# Patient Record
Sex: Female | Born: 1935
Health system: Southern US, Community
[De-identification: ages and names within clinical notes are randomized; demographics above are authoritative.]

## PROBLEM LIST (undated history)

## (undated) DIAGNOSIS — I499 Cardiac arrhythmia, unspecified: Secondary | ICD-10-CM

## (undated) DIAGNOSIS — M810 Age-related osteoporosis without current pathological fracture: Secondary | ICD-10-CM

## (undated) DIAGNOSIS — J310 Chronic rhinitis: Secondary | ICD-10-CM

## (undated) DIAGNOSIS — U071 COVID-19: Secondary | ICD-10-CM

## (undated) DIAGNOSIS — I10 Essential (primary) hypertension: Secondary | ICD-10-CM

## (undated) DIAGNOSIS — E039 Hypothyroidism, unspecified: Secondary | ICD-10-CM

## (undated) DIAGNOSIS — J189 Pneumonia, unspecified organism: Secondary | ICD-10-CM

## (undated) DIAGNOSIS — K219 Gastro-esophageal reflux disease without esophagitis: Secondary | ICD-10-CM

## (undated) DIAGNOSIS — G5790 Unspecified mononeuropathy of unspecified lower limb: Secondary | ICD-10-CM

## (undated) DIAGNOSIS — I4891 Unspecified atrial fibrillation: Secondary | ICD-10-CM

## (undated) HISTORY — DX: Cardiac arrhythmia, unspecified: I49.9

## (undated) HISTORY — PX: BACK SURGERY: SHX140

## (undated) HISTORY — PX: CATARACT EXTRACTION: SUR2

## (undated) HISTORY — PX: NOSE SURGERY: SHX723

---

## 2019-09-04 ENCOUNTER — Emergency Department (HOSPITAL_COMMUNITY): Payer: Medicare HMO

## 2019-09-04 ENCOUNTER — Encounter (HOSPITAL_COMMUNITY): Payer: Self-pay | Admitting: Internal Medicine

## 2019-09-04 ENCOUNTER — Observation Stay (HOSPITAL_COMMUNITY)
Admission: EM | Admit: 2019-09-04 | Discharge: 2019-09-06 | Disposition: A | Discharge status: Home / Self Care | Payer: Medicare HMO | Attending: Internal Medicine | Admitting: Internal Medicine

## 2019-09-04 ENCOUNTER — Other Ambulatory Visit: Payer: Self-pay

## 2019-09-04 DIAGNOSIS — I10 Essential (primary) hypertension: Secondary | ICD-10-CM | POA: Insufficient documentation

## 2019-09-04 DIAGNOSIS — Z20822 Contact with and (suspected) exposure to covid-19: Secondary | ICD-10-CM | POA: Insufficient documentation

## 2019-09-04 DIAGNOSIS — E039 Hypothyroidism, unspecified: Secondary | ICD-10-CM | POA: Diagnosis not present

## 2019-09-04 DIAGNOSIS — M542 Cervicalgia: Secondary | ICD-10-CM | POA: Insufficient documentation

## 2019-09-04 DIAGNOSIS — S3993XA Unspecified injury of pelvis, initial encounter: Secondary | ICD-10-CM | POA: Diagnosis present

## 2019-09-04 DIAGNOSIS — R519 Headache, unspecified: Secondary | ICD-10-CM | POA: Diagnosis not present

## 2019-09-04 DIAGNOSIS — S32592A Other specified fracture of left pubis, initial encounter for closed fracture: Secondary | ICD-10-CM

## 2019-09-04 DIAGNOSIS — Z79899 Other long term (current) drug therapy: Secondary | ICD-10-CM | POA: Diagnosis not present

## 2019-09-04 DIAGNOSIS — W06XXXA Fall from bed, initial encounter: Secondary | ICD-10-CM | POA: Diagnosis not present

## 2019-09-04 DIAGNOSIS — S32599A Other specified fracture of unspecified pubis, initial encounter for closed fracture: Secondary | ICD-10-CM | POA: Diagnosis present

## 2019-09-04 DIAGNOSIS — Y999 Unspecified external cause status: Secondary | ICD-10-CM | POA: Insufficient documentation

## 2019-09-04 DIAGNOSIS — W19XXXA Unspecified fall, initial encounter: Secondary | ICD-10-CM

## 2019-09-04 DIAGNOSIS — S32502A Unspecified fracture of left pubis, initial encounter for closed fracture: Secondary | ICD-10-CM | POA: Diagnosis not present

## 2019-09-04 DIAGNOSIS — M810 Age-related osteoporosis without current pathological fracture: Secondary | ICD-10-CM

## 2019-09-04 DIAGNOSIS — S329XXA Fracture of unspecified parts of lumbosacral spine and pelvis, initial encounter for closed fracture: Secondary | ICD-10-CM | POA: Diagnosis present

## 2019-09-04 DIAGNOSIS — Z87891 Personal history of nicotine dependence: Secondary | ICD-10-CM | POA: Insufficient documentation

## 2019-09-04 DIAGNOSIS — Y939 Activity, unspecified: Secondary | ICD-10-CM | POA: Diagnosis not present

## 2019-09-04 DIAGNOSIS — Y92009 Unspecified place in unspecified non-institutional (private) residence as the place of occurrence of the external cause: Secondary | ICD-10-CM | POA: Diagnosis not present

## 2019-09-04 HISTORY — DX: Gastro-esophageal reflux disease without esophagitis: K21.9

## 2019-09-04 HISTORY — DX: Unspecified mononeuropathy of unspecified lower limb: G57.90

## 2019-09-04 HISTORY — DX: Essential (primary) hypertension: I10

## 2019-09-04 HISTORY — DX: Chronic rhinitis: J31.0

## 2019-09-04 HISTORY — DX: Unspecified atrial fibrillation: I48.91

## 2019-09-04 HISTORY — DX: Hypothyroidism, unspecified: E03.9

## 2019-09-04 HISTORY — DX: Age-related osteoporosis without current pathological fracture: M81.0

## 2019-09-04 LAB — CBC WITH DIFFERENTIAL/PLATELET
Abs Immature Granulocytes: 0.03 10*3/uL (ref 0.00–0.07)
Basophils Absolute: 0 10*3/uL (ref 0.0–0.1)
Basophils Relative: 0 %
Eosinophils Absolute: 0 10*3/uL (ref 0.0–0.5)
Eosinophils Relative: 0 %
HCT: 41.3 % (ref 36.0–46.0)
Hemoglobin: 13.4 g/dL (ref 12.0–15.0)
Immature Granulocytes: 0 %
Lymphocytes Relative: 9 %
Lymphs Abs: 0.9 10*3/uL (ref 0.7–4.0)
MCH: 30.2 pg (ref 26.0–34.0)
MCHC: 32.4 g/dL (ref 30.0–36.0)
MCV: 93.2 fL (ref 80.0–100.0)
Monocytes Absolute: 0.6 10*3/uL (ref 0.1–1.0)
Monocytes Relative: 6 %
Neutro Abs: 9.1 10*3/uL — ABNORMAL HIGH (ref 1.7–7.7)
Neutrophils Relative %: 85 %
Platelets: 250 10*3/uL (ref 150–400)
RBC: 4.43 MIL/uL (ref 3.87–5.11)
RDW: 14 % (ref 11.5–15.5)
WBC: 10.7 10*3/uL — ABNORMAL HIGH (ref 4.0–10.5)
nRBC: 0 % (ref 0.0–0.2)

## 2019-09-04 LAB — URINALYSIS, ROUTINE W REFLEX MICROSCOPIC
Bacteria, UA: NONE SEEN
Bilirubin Urine: NEGATIVE
Glucose, UA: NEGATIVE mg/dL
Hgb urine dipstick: NEGATIVE
Ketones, ur: NEGATIVE mg/dL
Nitrite: NEGATIVE
Protein, ur: NEGATIVE mg/dL
Specific Gravity, Urine: 1.006 (ref 1.005–1.030)
pH: 8 (ref 5.0–8.0)

## 2019-09-04 LAB — BASIC METABOLIC PANEL
Anion gap: 11 (ref 5–15)
BUN: 15 mg/dL (ref 8–23)
CO2: 25 mmol/L (ref 22–32)
Calcium: 9.3 mg/dL (ref 8.9–10.3)
Chloride: 103 mmol/L (ref 98–111)
Creatinine, Ser: 1.11 mg/dL — ABNORMAL HIGH (ref 0.44–1.00)
GFR calc Af Amer: 50 mL/min — ABNORMAL LOW (ref >60)
GFR calc non Af Amer: 43 mL/min — ABNORMAL LOW (ref >60)
Glucose, Bld: 131 mg/dL — ABNORMAL HIGH (ref 70–99)
Potassium: 3.7 mmol/L (ref 3.5–5.1)
Sodium: 139 mmol/L (ref 135–145)

## 2019-09-04 LAB — SARS CORONAVIRUS 2 BY RT PCR (HOSPITAL ORDER, PERFORMED IN ~~LOC~~ HOSPITAL LAB): SARS Coronavirus 2: NEGATIVE

## 2019-09-04 MED ORDER — ACETAMINOPHEN 325 MG PO TABS: 650.0000 mg | ORAL_TABLET | Freq: Four times a day (QID) | ORAL | Status: DC | PRN

## 2019-09-04 MED ORDER — OXYCODONE HCL 5 MG PO TABS
5.0000 mg | ORAL_TABLET | ORAL | Status: DC | PRN
  Administered 2019-09-04 – 2019-09-06 (×2): 5 mg via ORAL
  Filled 2019-09-04 (×2): qty 1

## 2019-09-04 MED ORDER — FENTANYL CITRATE (PF) 100 MCG/2ML IJ SOLN
50.0000 ug | Freq: Once | INTRAMUSCULAR | Status: AC
  Administered 2019-09-04: 50 ug via INTRAVENOUS
  Filled 2019-09-04: qty 2

## 2019-09-04 MED ORDER — LISINOPRIL 5 MG PO TABS
2.5000 mg | ORAL_TABLET | Freq: Every day | ORAL | Status: DC
  Administered 2019-09-04 – 2019-09-05 (×2): 2.5 mg via ORAL
  Filled 2019-09-04 (×3): qty 1

## 2019-09-04 MED ORDER — HYDROMORPHONE HCL 1 MG/ML IJ SOLN
0.5000 mg | Freq: Once | INTRAMUSCULAR | Status: AC
  Administered 2019-09-04: 0.5 mg via INTRAVENOUS
  Filled 2019-09-04: qty 1

## 2019-09-04 MED ORDER — IPRATROPIUM BROMIDE 0.06 % NA SOLN
1.0000 | Freq: Two times a day (BID) | NASAL | Status: DC
  Administered 2019-09-05 (×3): 3 via NASAL
  Administered 2019-09-06: 1 via NASAL
  Filled 2019-09-04: qty 15

## 2019-09-04 MED ORDER — ENOXAPARIN SODIUM 30 MG/0.3ML ~~LOC~~ SOLN
30.0000 mg | SUBCUTANEOUS | Status: DC
  Administered 2019-09-04 – 2019-09-05 (×2): 30 mg via SUBCUTANEOUS
  Filled 2019-09-04 (×3): qty 0.3

## 2019-09-04 MED ORDER — LEVOTHYROXINE SODIUM 50 MCG PO TABS
50.0000 ug | ORAL_TABLET | Freq: Every day | ORAL | Status: DC
  Administered 2019-09-05 – 2019-09-06 (×2): 50 ug via ORAL
  Filled 2019-09-04 (×2): qty 1

## 2019-09-04 MED ORDER — ACETAMINOPHEN 650 MG RE SUPP: 650.0000 mg | Freq: Four times a day (QID) | RECTAL | Status: DC | PRN

## 2019-09-04 MED ORDER — ADULT MULTIVITAMIN W/MINERALS CH
1.0000 | ORAL_TABLET | Freq: Every day | ORAL | Status: DC
  Administered 2019-09-05 – 2019-09-06 (×2): 1 via ORAL
  Filled 2019-09-04 (×2): qty 1

## 2019-09-04 MED ORDER — PANTOPRAZOLE SODIUM 40 MG PO TBEC
40.0000 mg | DELAYED_RELEASE_TABLET | Freq: Every day | ORAL | Status: DC
  Administered 2019-09-04 – 2019-09-06 (×3): 40 mg via ORAL
  Filled 2019-09-04 (×3): qty 1

## 2019-09-04 MED ORDER — GABAPENTIN 300 MG PO CAPS
300.0000 mg | ORAL_CAPSULE | Freq: Three times a day (TID) | ORAL | Status: DC
  Administered 2019-09-04 – 2019-09-06 (×7): 300 mg via ORAL
  Filled 2019-09-04 (×6): qty 1

## 2019-09-04 NOTE — ED Notes (Signed)
Report attempted x 1

## 2019-09-04 NOTE — H&P (Signed)
Date: 09/04/2019               Patient Name:  Kathleen Santana MRN: [id number]  DOB: May 04, 1926 Age / Sex: 86 y.o., female   PCP: Deloris Ping, MD         Medical Service: Internal Medicine Teaching Service         Attending Physician: Dr. Heide Spark MD    First Contact: Dr. Renaldo Fiddler Pager: [phone]  Second Contact: Dr. Nedra Hai Pager: [phone]       After Hours (After 5p/  First Contact Pager: [phone]  weekends / holidays): Second Contact Pager: [phone]   Chief Complaint: Fall/Hip Pain  History of Present Illness:   Kathleen Santana is a 86 y/o F with a PMHx of HTN, hypothyroidism, bilateral lower extremity neuropathy, GERD who was brought in by EMS after falling out of bed early this morning and having right sided head and right sided hip pain. The patient states she was sleeping in bed and rolled out of the bed. She reported hitting the right side of her head and right hip. She denies any loss of consciousness. She denies any dizziness, shortness of breath, chest pain, or pleuritic chest pain prior, during, or after the fall. After falling, she scooted herself to the bathroom but was unable to stand up. She then scooted herself back to the bedside, pulled down the cord on a land line phone, and called her daughter. Her daughter arrived at the patient's home and called 911. EMS arrived on scene and brought patient to the ED.   She reports right hip pain, she describes the pain as "terrible." She states the pain is worse with standing and movement and notes relief when lying in bed and with rest. She also notes right elbow pain and right rib pain. She states she started feeling the right rib pain while in the ED. She states she has a dull headache that comes and goes. She reports the pain goes around her head.    She reports episodes of feeling flushed while in the ED. Denies any recent illness. She notes just getting back from the beach. She has not taken any of her daily  medications today. She states she feels a bit dehydrated. She denies any rib pain when taking a deep breath.   The patient lives alone and is able to ambulate on her own without any assistance. She is able to perform her ADL's independently and she walks 3 miles daily.   She has no other complaints at the time of my examination.  Meds:  Lisinopril 2.5 mg tab, qd Gabapentin 300 mg capsule tid Ipratropium .03% nasal spray Two sprays nostril daily BID-TID Levothyroxine sodium 50 mcg tab QD Omeprazole 20 mg capsule daily  Allergies: Allergies as of 09/04/2019 - Review Complete 09/04/2019  Allergen Reaction Noted  . Other Other (See Comments) and Cough 02/09/2013   Past Medical History:  Diagnosis Date  . Afib (HCC)   . GERD (gastroesophageal reflux disease)   . Hypertension   . Hypothyroidism   . Lower extremity neuropathy    PSH: Hysterectomy, Tonsillectomy, Adenoidectomy  Family History:  Breast Cancer Daughter Arthritis Sister Stroke Sister  Social History:  Smoking - Former Smoker, .50 PPD. Quit 1971.  Alcohol - None Caffeine - Half a up of coffee daily  Review of Systems: A complete ROS was negative except as per HPI.   Physical Exam: Blood pressure (!) 173/84, pulse 95, temperature 98.2 F (36.8  C), temperature source Oral, resp. rate 19, height 5\' 2"  (1.575 m), weight 54 kg, SpO2 98 %. Physical Exam Vitals and nursing note reviewed.  Constitutional:      General: She is not in acute distress.    Appearance: Normal appearance. She is not ill-appearing, toxic-appearing or diaphoretic.  HENT:     Head: Normocephalic and atraumatic.  Eyes:     Extraocular Movements: Extraocular movements intact.  Cardiovascular:     Rate and Rhythm: Normal rate and regular rhythm.     Pulses: Normal pulses.     Heart sounds: Normal heart sounds. No murmur heard.  No friction rub. No gallop.      Comments: Strong bilateral posterior tibial and dorsalis pedis pulses.    Pulmonary:     Effort: Pulmonary effort is normal. No respiratory distress.     Breath sounds: Normal breath sounds. No stridor. No wheezing, rhonchi or rales.  Abdominal:     General: Abdomen is flat. Bowel sounds are normal. There is no distension.     Palpations: Abdomen is soft.     Tenderness: There is abdominal tenderness (LUQ). There is no guarding or rebound.  Musculoskeletal:        General: Swelling (right lateral upper thigh) and tenderness (right posterior hip, lateral hip. Pain right hip with movement) present. No deformity.     Cervical back: Normal range of motion.     Right lower leg: No edema.     Left lower leg: No edema.     Comments: Right leg shorter than left, patient states chronic  Skin:    General: Skin is warm and dry.     Findings: Bruising (right lateral thigh) present. No erythema.  Neurological:     General: No focal deficit present.     Mental Status: She is alert and oriented to person, place, and time. Mental status is at baseline.     Sensory: No sensory deficit.     Comments: Normal sensation of lower extremities.   Psychiatric:        Mood and Affect: Mood normal.        Behavior: Behavior normal.    EKG: personally reviewed my interpretation is Normal Sinus Rhythm, Rate of 85. Normal PR and QT intervals. Normal Axis. No ST segment abnormalities. Mild Artifact Present.  Imaging:  X-Ray Ribs unilateral w/ chest right IMPRESSION: No acute disease. Aortic Atherosclerosis   CT Hip Right W/O Contrast IMPRESSION: Acute fractures of the right sacrum and right superior and inferior pubic rami. Negative for hip fracture or dislocation. Soft tissue contusion adjacent to the right hip. Mild right hip osteoarthritis.  X-Ray Lumbar Spine Complete  IMPRESSION: No acute abnormality. Lumbar degenerative change.  X-Ray Right Hip Unilateral W or W/O Pelvis 2-3 Views IMPRESSION: Acute left superior pubic ramus fracture just peripheral to the pubic  body. No other acute abnormality is identified.  CT Head W/O & Cervical Spine W/O Contrast IMPRESSION: 1. No acute intracranial abnormality. 2. Signs of atrophy and chronic microvascular ischemic change. 3. Chronic sinusitis with complete opacification in the RIGHT maxillary sinus 4. No evidence for acute fracture or malalignment of the cervical spine. 5. Multilevel spinal degenerative changes. 6. Biapical pleural and parenchymal scarring greatest on the RIGHT. Aortic Atherosclerosis   Assessment & Plan by Problem:  Kathleen HolterHelen Santana is a 86 y/o F with a PMHx of HTN, AFIB, Hypothyroidism, Lower Extremity Neuropathy , Osteoperosis, GERD, and Non-Allergic Rhinitis who presents to the ED from EMS with a  fall from bed height resulting in right head, rib, and hip pain. Imaging revealed acute fractures of the right sacrum and right superior/inferior pubic rami. No hip fracture or dislocation present. CT Head W/O C negative for acute intracranial abnormality. Right side chest imaging revealed no acute diseases present. Patient to be admitted to internal medicine teaching service for observation for further observation.  Right Sacrum and Right Superior/Inferior Pubic Rami Fracture - Suspected etiology of fractures being that of osteoporosis in context of fall from low height.  - Low suspicion for syncopal episode, CVA/TIA, or cardiac etiology being cause of patient's fall. - Fractures evident on patient's right hip x-ray as well as right hip CT - Right Hip X-ray was ordered, however, radiologist findings and interpretation stated left sided pathological findings. Attempted to contact radiologist for clarification. Pending response at this time.  - No evidence of vascular or neurological damage present on physical exam - Current recommendations per uptodate concerning sacral and pubic rami fractures are pain control and early ambulation/movement. Do not believe orthopedic evaluation is warranted at  this time. - Will have PT/OT evaluate patient and give their recommendations - Pain well controlled with tylenol 650 mg Q6H PRN for mild pain, Oxycodone 5 mg Q4H PRN for moderate pain - Patient received dilaudid injection .5mg  while in ED.  History of Osteoporosis - DEXA from 07/05/2019 indicated osteopetrosis. Suspected etiology of patient's fracture from low height.  - Patient notes PCP wanted to treat with Prolia, but patient states it was too expensive - Will consider starting alendronate while admitted or have patient follow up with PCP  Headache - Patient having dull headache around her head. Suspected etiology of contusion, also considering concussion. - CT Head W/O Contrast did not reveal any acute intracranial abnormality. Low suspicion for cranial hemorrhage at this time.  - Will continue to monitor for acute neurological changes - Pain regimen as per above  Right Sided Rib Pain - Patient complaining of right sided rib pain.  - Imaging revealed no acute disease.  - Suspected etiology contusion vs costochondritis vs muscle strain. Low suspicion for fractured rib, pneumothorax, MI.  - Pain regimen as per above  Transient Hypoxia - EMS noted that patient became hypoxic when ambulating around the room, O2 saturation became 77% on room air. Tech was unsure if this was a true reading. - ED physician assistant 07/07/2019 stated that he himself ambulated the patient around the room and her O2 saturation did not drop below 98% on room air. - Patient not hypoxic upon my evaluation, denies any shortness of breath, chest pain, palpitations. Low suspicion for pulmonary embolism, respiratory infection, pulmonary etiology. - Suspect episode of hypoxia was not patient's true O2 saturation - Will continue to evaluate and monitor during her hospitalization.   Hypertension - History of hypertension, recent BP of 143/89.  - Will continue home regimen of lisinopril 2.5 mg tab daily at bedtime    History of AFIB - Patient notes PMHx of Atrial Fibrillation. History unclear, but patient notes that she was treated on medication for one month and has not been in afib since as far as she knows. - Denies any palpitations, fluttering in chest, feeling of heart racing, or chest pain.  - EKG revealed no afib or other arrythmias. Normal sinus rhythm. - Will continue to monitor.   History of Hypothyroidism - Patient with history of known hypothyroidism, well controlled on levothyroxine - Will treat with home medication of levothyroxine 50 mcg tab daily with breakfast  History of Bilateral Lower Extremity Neuropathy - Patient with history of bilateral lower extremity neuropathy, followed by neurology - Patient unsure of origin of neuropathy - Will continue home medication of gabapentin 300 mg capsule TID before meals and bedtime.  GERD - Patient with PMHx of GERD - Patient on omeprazole at home, will order pantoprazole 40 mg tab daily  Non-Allergic Rhinitis - Patient with PMHx of non-allergic rhinitis, leading to patient having chronic wet cough and gurgling in throat - Will continue home medication of ipratropium .03% nasal spray 1-3 sprays per nostril BID  Diet: Heart Healthy DVT Prophylaxis: Lovenox 30 mg Code Status: Full Code Dispo: Admit patient to Observation with expected length of stay less than 2 midnights.  SignedBelva Agee, MD 09/04/2019, 10:04 PM  Pager: [phone] After 5pm on weekdays and 1pm on weekends: On Call pager: [phone]

## 2019-09-04 NOTE — ED Provider Notes (Signed)
Patient was received at handoff due to shift change from [facility], [place], provided HPI, current work-up, likely disposition.  Please see her note for full detail.  In short patient presents to the emergency department after suffering a mechanical fall at home.  She fell on her right hip and has had hip pain.  Imaging was performed showed she has acute fracture of the right sacrum and right superior and inferior pubic rami, no evidence of hip fracture or dislocations.  Lab work-up  shows slight hyperglycemia of 131, slight elevation in creatinine of 1.11, slight leukocytosis of 10.7.  Covid negative, UA shows trace leukocytes, no nitrates, bacteria or white blood cells noted. Physical Exam  BP (!) 179/78   Pulse 89   Temp (!) 97.5 F (36.4 C) (Oral)   Resp 18   SpO2 92%   Physical Exam Vitals and nursing note reviewed.  Constitutional:      General: She is not in acute distress.    Appearance: She is not ill-appearing.  HENT:     Head: Normocephalic and atraumatic.     Nose: No congestion.     Mouth/Throat:     Mouth: Mucous membranes are moist.     Pharynx: Oropharynx is clear.  Eyes:     General: No scleral icterus. Cardiovascular:     Rate and Rhythm: Normal rate and regular rhythm.     Pulses: Normal pulses.     Heart sounds: No murmur heard.  No friction rub. No gallop.   Pulmonary:     Effort: No respiratory distress.     Breath sounds: No wheezing, rhonchi or rales.  Abdominal:     General: There is no distension.     Tenderness: There is no abdominal tenderness. There is no guarding.  Musculoskeletal:        General: Tenderness and signs of injury present. No swelling or deformity.     Right lower leg: No edema.     Left lower leg: No edema.     Comments: decreased range of motion in her right hip flexor unable to adductor or abductor, as well as marked weakness with hip flexion and extension.  Neurovascular fully intact.  Skin:    General: Skin is warm and dry.      Capillary Refill: Capillary refill takes less than 2 seconds.     Findings: No rash.  Neurological:     General: No focal deficit present.     Mental Status: She is alert.  Psychiatric:        Mood and Affect: Mood normal.     ED Course/Procedures   Clinical Course as of Sep 04 1915  Sat Sep 04, 2019  1256 There is no fracture or other acute abnormality. 0.8 cm anterolisthesis L3 on L4 due to facet arthropathy is noted. Convex left scoliosis with the apex at T12 is seen. Loss of disc space height is worst at T12-L1, L1-2 and L3-4.   DG Lumbar Spine Complete [CG]  1257 Acute left superior pubic ramus fracture just peripheral to the pubic body. No other acute abnormality is identified.  DG Hip Unilat W or Wo Pelvis 2-3 Views Right [CG]  1436 Was told by EMT that patient SPO2 dropped to 77% after ambulation around the bed.  Tech  unsure if there was a good Financial controller wave.  Per EMT patient appeared unsteady due to pain and had increased work of breathing after walking trial.  I personally ambulated patient a second time and  SPO2 remained greater than 98%.  Some gurgling noise in her throat and wet sounding cough but patient states this is chronic due to "hyperactive sinus glands" that she has had surgery for.  She ambulated around her bed once with some assistance and at the end noted to be breathing faster, admits to breathing "feeling labored".  Eventually this resolved. Patient and family at bedside state pt is very active for her age and walks 3 miles every other day without issues.     [CG]    Clinical Course User Index [CG] Liberty Handy, PA-C    Procedures  MDM    I have personally reviewed all imaging, labs and have interpreted them.  Patient presents after a mechanical fall with right hip pain.  Patient was alert and oriented did not appear to be in acute distress, vital signs within normal limits.  On exam patient had decreased range of motion in her right hip flexor  unable to adductor or abductor, as well as marked weakness with hip flexion and extension.  Neurovascular fully intact.  Patient was ambulated became slightly hypoxic and was severely unstable on her feet.  Patient is nonhypoxic while supine, low suspicion for PE or systemic infection as x-ray does not show any acute abnormalities.  Likely shortness of breath and imbalance secondary to pain of right pelvis fracture.  Patient is at a high fall risk and would benefit from inpatient observation as well as PT assessment.  Will contact hospitalist for further recommendations and evaluation.  Patient's pain is under control is not requiring additional pain management at this time.  19:59 spoke with Dr. Maxcine Ham internal medical service and she states she will come and evaluate the patient for admission to the hospital.  Patient care will be transferred over to hospitalist team for further you evaluation.  Patient daughter was at bedside and was notified of patient disposition.       Carroll Sage, PA-C 09/04/19 2048    Rolan Bucco, MD 09/04/19 2101

## 2019-09-04 NOTE — ED Triage Notes (Signed)
Patient arrives via EMS from home due falling out of bed this morning around 0630. Per the patient, she was asleep and was on the edge of the bed when she rolled off. She did hit the left sider of her head, she is NOT on blood thinners. She lives alone.   Patient arrives with c/o of right hip/groin pain that worsens with movement. Rates pain a 4/10. Alert and oriented x4.

## 2019-09-04 NOTE — ED Notes (Signed)
Helped ambulate pt around the bed while holding onto the pt. She stated that her pain was worse and it hurt to pick up her leg. Her Spo2 dropped to 77.

## 2019-09-04 NOTE — ED Provider Notes (Signed)
[facility] EMERGENCY DEPARTMENT Provider Note   CSN: [id number] Arrival date & time: 09/04/19  1019     History Chief Complaint  Patient presents with  . Fall    Kathleen Santana is a 86 y.o. female with no significant past medical history presents to the ED for evaluation of fall.  History obtained from patient.  Daughter is at bedside.  States she was just sleeping and thinks she rolled off her bed and fell onto her right side on the ground.  She hit the right side of her head.  She has a local right-sided headache, right-sided neck pain, and abrasion in her right elbow but the majority of her pain is localized in her right hip and low back.  Has not been able to walk due to the pain in the hip.  Denies any other injuries.  Denies vision changes, nausea or vomiting.  Denies chest pain or shortness of breath.  No abdominal pain.  Reports history of neuropathy in her feet but no sudden loss of sensation in her toes or legs or arms.  Feels like she has to urinate but cannot do this on the pure wick.  Has been feeling well and at her baseline the last few days without any malaise, fevers or chills.  No dysuria recently.  Denies any recent palpitations, chest pain lightheadedness or syncope.  States she has not fallen in a long time.  She lives alone.  No anticoagulants.  HPI     Past Medical History:  Diagnosis Date  . Afib (HCC)   . GERD (gastroesophageal reflux disease)   . Hypertension   . Hypothyroidism   . Lower extremity neuropathy   . Osteoporosis   . Rhinitis, non-allergic     Patient Active Problem List   Diagnosis Date Noted  . Pubic ramus fracture (HCC) 09/04/2019  . Osteoporosis 09/04/2019    History reviewed. No pertinent surgical history.   OB History   No obstetric history on file.     History reviewed. No pertinent family history.  Social History   Tobacco Use  . Smoking status: Former Smoker    Types: Cigarettes  . Smokeless  tobacco: Never Used  Substance Use Topics  . Alcohol use: Not on file  . Drug use: Not on file    Home Medications Prior to Admission medications   Medication Sig Start Date End Date Taking? Authorizing Provider  Cholecalciferol (VITAMIN D-3 PO) Take 1 capsule by mouth daily with breakfast.   Yes [provider]  gabapentin (NEURONTIN) 300 MG capsule Take 300 mg by mouth in the morning, at noon, in the evening, and at bedtime.   Yes [provider]  ipratropium (ATROVENT) 0.03 % nasal spray Place 1-3 sprays into both nostrils in the morning and at bedtime.   Yes [provider]  levothyroxine (SYNTHROID) 50 MCG tablet Take 50 mcg by mouth daily before breakfast.   Yes [provider]  lisinopril (ZESTRIL) 2.5 MG tablet Take 2.5 mg by mouth at bedtime.   Yes [provider]  Multiple Vitamins-Minerals (ONE-A-DAY WOMENS 50+ ADVANTAGE) TABS Take 1 tablet by mouth daily with breakfast.   Yes [provider]  omeprazole (PRILOSEC) 20 MG capsule Take 20 mg by mouth daily as needed (before eating spicy/hot foods).    Yes [provider]    Allergies    Other  Review of Systems   Review of Systems  Musculoskeletal: Positive for arthralgias, gait problem and  neck pain.  Skin: Positive for wound.  Neurological: Positive for headaches.  All other systems reviewed and are negative.   Physical Exam Updated Vital Signs BP 136/72 (BP Location: Left Arm)   Pulse 78   Temp (!) 97.3 F (36.3 C) (Oral)   Resp 15   Ht 5\' 2"  (1.575 m)   Wt 53.1 kg   SpO2 96%   BMI 21.41 kg/m   Physical Exam Vitals and nursing note reviewed.  Constitutional:      General: She is not in acute distress.    Appearance: She is well-developed.     Comments: NAD.  HENT:     Head: Normocephalic.     Comments: Patient reports pain posterior right ear but I cannot reproduce tenderness.  No crepitus, abrasions or lacerations in the scalp.  No facial  bone tenderness or signs of injury.    Right Ear: External ear normal.     Left Ear: External ear normal.     Nose: Nose normal.     Mouth/Throat:     Comments: Dentition intact and stable.  No intraoral or tongue injury. Eyes:     General: No scleral icterus.    Conjunctiva/sclera: Conjunctivae normal.  Neck:     Comments: No cervical collar in place.  Right-sided neck tenderness.  No midline C-spine tenderness.  Full range of motion of the neck, patient reports pain with right neck bend and rotation.  Trachea midline. Cardiovascular:     Rate and Rhythm: Normal rate and regular rhythm.     Heart sounds: Normal heart sounds.  Pulmonary:     Effort: Pulmonary effort is normal.     Breath sounds: Normal breath sounds.  Abdominal:     Palpations: Abdomen is soft.     Tenderness: There is no abdominal tenderness.     Comments: Pure work in place, I repositioned this to help patient urinate.  No suprapubic or CVA tenderness.  Musculoskeletal:        General: Tenderness present. No deformity. Normal range of motion.     Cervical back: Normal range of motion and neck supple. Tenderness present.     Comments:  Pelvis: Right anterior hip and inguinal crease tenderness.  Pain with right logroll and internal rotation, hip flexion.  Patient can heel drag the right leg with some pain in the right hip and groin. Patient can rearrange herself in bed, sit up without assistance. Mild right-sided lumbar muscular tenderness.  No midline tenderness in the lumbar, thoracic spine. Pelvis stable without instability in APL compression. No leg shortening or rotation.   Examination of the right upper extremity, right knee, right ankle do not reveal any signs of acute trauma, tenderness.  Skin:    General: Skin is warm and dry.     Capillary Refill: Capillary refill takes less than 2 seconds.  Neurological:     Mental Status: She is alert and oriented to person, place, and time.     Comments:   Mental  Status: Patient is awake, alert, oriented to person, place, year, and situation. Patient is able to give a clear and coherent history.  Speech is fluent and clear without dysarthria or aphasia. No signs of neglect.  Cranial Nerves: I not tested II visual fields full bilaterally. PERRL.  Unable to visualize posterior eye. III, IV, VI EOMs intact without ptosis or diplopia  V sensation to light touch intact in all 3 divisions of trigeminal nerve bilaterally  VII facial movements symmetric bilaterally  VIII hearing intact to voice/conversation  IX, X no uvula deviation, symmetric rise of soft palate/uvula XI 5/5 SCM and trapezius strength bilaterally  XII tongue protrusion midline, symmetric L/R movements  Motor: Strength 5/5 in upper/lower extremities.  Sensation to light touch intact in face, upper/lower extremities.    Cerebellar: No ataxia with finger to nose.   Psychiatric:        Behavior: Behavior normal.        Thought Content: Thought content normal.        Judgment: Judgment normal.     ED Results / Procedures / Treatments   Labs (all labs ordered are listed, but only abnormal results are displayed) Labs Reviewed  CBC WITH DIFFERENTIAL/PLATELET - Abnormal; Notable for the following components:      Result Value   WBC 10.7 (*)    Neutro Abs 9.1 (*)    All other components within normal limits  BASIC METABOLIC PANEL - Abnormal; Notable for the following components:   Glucose, Bld 131 (*)    Creatinine, Ser 1.11 (*)    GFR calc non Af Amer 43 (*)    GFR calc Af Amer 50 (*)    All other components within normal limits  URINALYSIS, ROUTINE W REFLEX MICROSCOPIC - Abnormal; Notable for the following components:   Color, Urine STRAW (*)    Leukocytes,Ua TRACE (*)    All other components within normal limits  BASIC METABOLIC PANEL - Abnormal; Notable for the following components:   Glucose, Bld 128 (*)    Creatinine, Ser 1.09 (*)    Calcium 8.7 (*)    GFR calc non Af Amer  44 (*)    GFR calc Af Amer 51 (*)    All other components within normal limits  CBC - Abnormal; Notable for the following components:   RBC 3.73 (*)    Hemoglobin 11.6 (*)    HCT 34.4 (*)    All other components within normal limits  SARS CORONAVIRUS 2 BY RT PCR (HOSPITAL ORDER, PERFORMED IN [facility])    EKG None  Radiology DG Ribs Unilateral W/Chest Right  Result Date: 09/04/2019 CLINICAL DATA:  Status post fall getting out of bed this morning. Right chest pain. Initial encounter. EXAM: RIGHT RIBS AND CHEST - 3+ VIEW COMPARISON:  None. FINDINGS: Lungs clear. Heart size normal. Aortic atherosclerosis. No pneumothorax or pleural fluid. No acute bony abnormality. Scoliosis noted. IMPRESSION: No acute disease. Aortic Atherosclerosis (ICD10-I70.0). Electronically Signed   By: Drusilla Kanner M.D.   On: 09/04/2019 15:47   DG Lumbar Spine Complete  Result Date: 09/04/2019 CLINICAL DATA:  Low back pain after a fall out of bed this morning. Initial encounter. EXAM: LUMBAR SPINE - COMPLETE 4+ VIEW COMPARISON:  None. FINDINGS: There is no fracture or other acute abnormality. 0.8 cm anterolisthesis L3 on L4 due to facet arthropathy is noted. Convex left scoliosis with the apex at T12 is seen. Loss of disc space height is worst at T12-L1, L1-2 and L3-4. IMPRESSION: No acute abnormality. Lumbar degenerative change. Electronically Signed   By: Drusilla Kanner M.D.   On: 09/04/2019 12:39   CT Head Wo Contrast  Result Date: 09/04/2019 CLINICAL DATA:  Neck trauma. EXAM: CT HEAD WITHOUT CONTRAST CT CERVICAL SPINE WITHOUT CONTRAST TECHNIQUE: Multidetector CT imaging of the head and cervical spine was performed following the standard protocol without intravenous contrast. Multiplanar CT image reconstructions of the cervical spine were also generated. COMPARISON:  None FINDINGS: CT HEAD FINDINGS Brain:  No evidence of acute infarction, hemorrhage, hydrocephalus, extra-axial collection or mass  lesion/mass effect. Signs of atrophy and chronic microvascular ischemic change. Vascular: No hyperdense vessel or unexpected calcification. Skull: Normal. Negative for fracture or focal lesion. Sinuses/Orbits: Near complete opacification of the RIGHT maxillary sinus indicative of chronic sinus disease. Thickening of the walls of the LEFT maxillary sinus also with some mucosal thickening in the LEFT maxillary sinus indicative of chronic sinus disease. Similar changes though less pronounced in the ethmoid sinuses. Mild involvement of LEFT sphenoid. No air-fluid level. Orbits are unremarkable. Other: None. CT CERVICAL SPINE FINDINGS Alignment: Straightening of normal cervical lordosis likely due to patient position or spasm. Skull base and vertebrae: No acute fracture. No primary bone lesion or focal pathologic process. Soft tissues and spinal canal: No prevertebral fluid or swelling. No visible canal hematoma. Disc levels: Degenerative changes of the atlantoaxial joint with mild pannus formation. Multilevel degenerative change with disc space narrowing and uncovertebral degenerative changes. Degenerative changes greatest at C5-6 and C6-7 with respect to disc space narrowing and uncovertebral degenerative changes with similar changes seen throughout the cervical spine. Facet hypertrophy and facet degenerative changes greatest at C4-5 on the LEFT. Upper chest: Biapical pleural and parenchymal scarring greatest on the RIGHT. Other: None IMPRESSION: 1. No acute intracranial abnormality. 2. Signs of atrophy and chronic microvascular ischemic change. 3. Chronic sinusitis with complete opacification in the RIGHT maxillary sinus 4. No evidence for acute fracture or malalignment of the cervical spine. 5. Multilevel spinal degenerative changes. 6. Biapical pleural and parenchymal scarring greatest on the RIGHT. Aortic Atherosclerosis (ICD10-I70.0). Electronically Signed   By: Donzetta Kohut M.D.   On: 09/04/2019 11:51   CT  Cervical Spine Wo Contrast  Result Date: 09/04/2019 CLINICAL DATA:  Neck trauma. EXAM: CT HEAD WITHOUT CONTRAST CT CERVICAL SPINE WITHOUT CONTRAST TECHNIQUE: Multidetector CT imaging of the head and cervical spine was performed following the standard protocol without intravenous contrast. Multiplanar CT image reconstructions of the cervical spine were also generated. COMPARISON:  None FINDINGS: CT HEAD FINDINGS Brain: No evidence of acute infarction, hemorrhage, hydrocephalus, extra-axial collection or mass lesion/mass effect. Signs of atrophy and chronic microvascular ischemic change. Vascular: No hyperdense vessel or unexpected calcification. Skull: Normal. Negative for fracture or focal lesion. Sinuses/Orbits: Near complete opacification of the RIGHT maxillary sinus indicative of chronic sinus disease. Thickening of the walls of the LEFT maxillary sinus also with some mucosal thickening in the LEFT maxillary sinus indicative of chronic sinus disease. Similar changes though less pronounced in the ethmoid sinuses. Mild involvement of LEFT sphenoid. No air-fluid level. Orbits are unremarkable. Other: None. CT CERVICAL SPINE FINDINGS Alignment: Straightening of normal cervical lordosis likely due to patient position or spasm. Skull base and vertebrae: No acute fracture. No primary bone lesion or focal pathologic process. Soft tissues and spinal canal: No prevertebral fluid or swelling. No visible canal hematoma. Disc levels: Degenerative changes of the atlantoaxial joint with mild pannus formation. Multilevel degenerative change with disc space narrowing and uncovertebral degenerative changes. Degenerative changes greatest at C5-6 and C6-7 with respect to disc space narrowing and uncovertebral degenerative changes with similar changes seen throughout the cervical spine. Facet hypertrophy and facet degenerative changes greatest at C4-5 on the LEFT. Upper chest: Biapical pleural and parenchymal scarring greatest on  the RIGHT. Other: None IMPRESSION: 1. No acute intracranial abnormality. 2. Signs of atrophy and chronic microvascular ischemic change. 3. Chronic sinusitis with complete opacification in the RIGHT maxillary sinus 4. No evidence for acute fracture or  malalignment of the cervical spine. 5. Multilevel spinal degenerative changes. 6. Biapical pleural and parenchymal scarring greatest on the RIGHT. Aortic Atherosclerosis (ICD10-I70.0). Electronically Signed   By: Donzetta Kohut M.D.   On: 09/04/2019 11:51   CT Hip Right Wo Contrast  Result Date: 09/04/2019 CLINICAL DATA:  Right hip pain after a fall out of bed this morning. Initial encounter. EXAM: CT OF THE RIGHT HIP WITHOUT CONTRAST TECHNIQUE: Multidetector CT imaging of the right hip was performed according to the standard protocol. Multiplanar CT image reconstructions were also generated. COMPARISON:  None. FINDINGS: Bones/Joint/Cartilage An acute, nondisplaced fracture of the right sacrum is identified. Minimally displaced fracture of the superior pubic ramus is also seen. Finally, mild buckling of the cortex of the inferior pubic ramus is consistent with a nondisplaced fracture. The right hip is and no hip fracture is seen. There is mild degenerative disease about the hip. No lytic or sclerotic lesion is identified. Ligaments Suboptimally assessed by CT. Muscles and Tendons Intact. Soft tissues Subcutaneous contusion adjacent to the right hip noted. IMPRESSION: Acute fractures of the right sacrum and right superior and inferior pubic rami. Negative for hip fracture or dislocation. Soft tissue contusion adjacent to the right hip. Mild right hip osteoarthritis. Electronically Signed   By: Drusilla Kanner M.D.   On: 09/04/2019 15:14   DG Hip Unilat W or Wo Pelvis 2-3 Views Right  Result Date: 09/04/2019 CLINICAL DATA:  Left hip pain after a fall out of bed this morning. Initial encounter. EXAM: DG HIP (WITH OR WITHOUT PELVIS) 2-3V RIGHT COMPARISON:  None.  FINDINGS: The left hip is located and intact. The patient has a nondisplaced left superior pubic ramus fracture just peripheral to the pubic body. Bones are osteopenic. IMPRESSION: Acute left superior pubic ramus fracture just peripheral to the pubic body. No other acute abnormality is identified. Electronically Signed   By: Drusilla Kanner M.D.   On: 09/04/2019 12:37    Procedures Procedures (including critical care time)  Medications Ordered in ED Medications  enoxaparin (LOVENOX) injection 30 mg (30 mg Subcutaneous Given 09/04/19 2135)  oxyCODONE (Oxy IR/ROXICODONE) immediate release tablet 5 mg (5 mg Oral Given 09/04/19 2325)  acetaminophen (TYLENOL) tablet 650 mg (has no administration in time range)    Or  acetaminophen (TYLENOL) suppository 650 mg (has no administration in time range)  gabapentin (NEURONTIN) capsule 300 mg (300 mg Oral Given 09/04/19 2330)  levothyroxine (SYNTHROID) tablet 50 mcg (50 mcg Oral Given 09/05/19 0620)  lisinopril (ZESTRIL) tablet 2.5 mg (2.5 mg Oral Given 09/04/19 2325)  pantoprazole (PROTONIX) EC tablet 40 mg (40 mg Oral Given 09/04/19 2325)  multivitamin with minerals tablet 1 tablet (has no administration in time range)  ipratropium (ATROVENT) 0.06 % nasal spray 1-3 spray (3 sprays Each Nare Given 09/05/19 0622)  fentaNYL (SUBLIMAZE) injection 50 mcg (50 mcg Intravenous Given 09/04/19 1256)  HYDROmorphone (DILAUDID) injection 0.5 mg (0.5 mg Intravenous Given 09/04/19 1721)    ED Course  I have reviewed the triage vital signs and the nursing notes.  Pertinent labs & imaging results that were available during my care of the patient were reviewed by me and considered in my medical decision making (see chart for details).  Clinical Course as of Sep 05 815  Sat Sep 04, 2019  1256 There is no fracture or other acute abnormality. 0.8 cm anterolisthesis L3 on L4 due to facet arthropathy is noted. Convex left scoliosis with the apex at T12 is seen. Loss of disc  space height is worst at T12-L1, L1-2 and L3-4.   DG Lumbar Spine Complete [CG]  1257 Acute left superior pubic ramus fracture just peripheral to the pubic body. No other acute abnormality is identified.  DG Hip Unilat W or Wo Pelvis 2-3 Views Right [CG]  1436 Was told by EMT that patient SPO2 dropped to 77% after ambulation around the bed.  Tech  unsure if there was a good Financial controller wave.  Per EMT patient appeared unsteady due to pain and had increased work of breathing after walking trial.  I personally ambulated patient a second time and SPO2 remained greater than 98%.  Some gurgling noise in her throat and wet sounding cough but patient states this is chronic due to "hyperactive sinus glands" that she has had surgery for.  She ambulated around her bed once with some assistance and at the end noted to be breathing faster, admits to breathing "feeling labored".  Eventually this resolved. Patient and family at bedside state pt is very active for her age and walks 3 miles every other day without issues.     [CG]    Clinical Course User Index [CG] Jerrell Mylar   MDM Rules/Calculators/A&P                          Patient's available EMR, triage and nursing notes reviewed to assist with MDM and obtain more history.  86 year old with a significant past medical history here after a mechanical fall, rolled off bed landing on the ground.  She reports right-sided headache, neck pain, right hip pain.  I have ordered CT head/cervical spine, lumbar and right hip/pelvis x-rays.  I have ordered fentanyl for pain.  Overall fall appears to be mechanical.  Has been well in the last few days and denies any other systemic symptoms to suggest more occult cause for a fall.  I don't think think further emergent lab work, EKG necessary today.  Discussed with EDP who agrees with plan of care.  Pending imaging.  CT scan, x-rays personally visualized and interpreted.  Hip x-ray shows RIGHT pubic ramus  fracture (typo on report). All other imaging non acute.   1508: Patient ambulated with EMT who documented hypoxia 77% after ambulation.  EMT unsure if this read had a good Plath wave.  I personally ambulated patient who maintained SPO2 greater than 98% but did report "labored breathing" at the end.  Noted gurgling/wet sounding cough that patient reports is chronic. Reportedly walks 3 miles every other day at home, lives alone, ambulates without assistance.  reported persistent right hip with hip flexion and ambulation after fentanyl.  Now reporting right lateral rib/flank pain that was not there an hour prior.  Discussed at length concern for possible hypoxia, continued pain and unsteadiness despite fentanyl, age with family and patient.  Recommended lab work, right rib x-ray, CT hip to evaluate for occult hip fracture.  1515: Low threshold to admit patient for pain management, PT and possible placement although she is hopeful she can be discharged home.  Appears to have good family support but does live alone. Discussed with EDP.  Pending CT, labs.    Final Clinical Impression(s) / ED Diagnoses Final diagnoses:  Fall, initial encounter  Closed fracture of ramus of left pubis, initial encounter [facility])    Rx / DC Orders ED Discharge Orders    None       Liberty Handy, PA-C 09/05/19 1610  Rolan Bucco, MD 09/05/19 [phone]

## 2019-09-04 NOTE — ED Notes (Signed)
Patient back from CT/xray. Bed changed due to sheets being soaked in urine.

## 2019-09-05 ENCOUNTER — Encounter (HOSPITAL_COMMUNITY): Payer: Self-pay | Admitting: Internal Medicine

## 2019-09-05 DIAGNOSIS — S3210XA Unspecified fracture of sacrum, initial encounter for closed fracture: Secondary | ICD-10-CM | POA: Diagnosis not present

## 2019-09-05 DIAGNOSIS — E039 Hypothyroidism, unspecified: Secondary | ICD-10-CM

## 2019-09-05 DIAGNOSIS — I4891 Unspecified atrial fibrillation: Secondary | ICD-10-CM

## 2019-09-05 DIAGNOSIS — G629 Polyneuropathy, unspecified: Secondary | ICD-10-CM | POA: Diagnosis not present

## 2019-09-05 DIAGNOSIS — I10 Essential (primary) hypertension: Secondary | ICD-10-CM

## 2019-09-05 DIAGNOSIS — S32591A Other specified fracture of right pubis, initial encounter for closed fracture: Secondary | ICD-10-CM | POA: Diagnosis not present

## 2019-09-05 DIAGNOSIS — W06XXXA Fall from bed, initial encounter: Secondary | ICD-10-CM

## 2019-09-05 DIAGNOSIS — K219 Gastro-esophageal reflux disease without esophagitis: Secondary | ICD-10-CM

## 2019-09-05 DIAGNOSIS — J328 Other chronic sinusitis: Secondary | ICD-10-CM

## 2019-09-05 DIAGNOSIS — S329XXA Fracture of unspecified parts of lumbosacral spine and pelvis, initial encounter for closed fracture: Secondary | ICD-10-CM | POA: Diagnosis present

## 2019-09-05 DIAGNOSIS — M810 Age-related osteoporosis without current pathological fracture: Secondary | ICD-10-CM

## 2019-09-05 LAB — CBC
HCT: 34.4 % — ABNORMAL LOW (ref 36.0–46.0)
Hemoglobin: 11.6 g/dL — ABNORMAL LOW (ref 12.0–15.0)
MCH: 31.1 pg (ref 26.0–34.0)
MCHC: 33.7 g/dL (ref 30.0–36.0)
MCV: 92.2 fL (ref 80.0–100.0)
Platelets: 210 10*3/uL (ref 150–400)
RBC: 3.73 MIL/uL — ABNORMAL LOW (ref 3.87–5.11)
RDW: 14.2 % (ref 11.5–15.5)
WBC: 8.6 10*3/uL (ref 4.0–10.5)
nRBC: 0 % (ref 0.0–0.2)

## 2019-09-05 LAB — BASIC METABOLIC PANEL
Anion gap: 10 (ref 5–15)
BUN: 18 mg/dL (ref 8–23)
CO2: 22 mmol/L (ref 22–32)
Calcium: 8.7 mg/dL — ABNORMAL LOW (ref 8.9–10.3)
Chloride: 103 mmol/L (ref 98–111)
Creatinine, Ser: 1.09 mg/dL — ABNORMAL HIGH (ref 0.44–1.00)
GFR calc Af Amer: 51 mL/min — ABNORMAL LOW (ref >60)
GFR calc non Af Amer: 44 mL/min — ABNORMAL LOW (ref >60)
Glucose, Bld: 128 mg/dL — ABNORMAL HIGH (ref 70–99)
Potassium: 3.7 mmol/L (ref 3.5–5.1)
Sodium: 135 mmol/L (ref 135–145)

## 2019-09-05 MED ORDER — FLUTICASONE PROPIONATE 50 MCG/ACT NA SUSP
1.0000 | Freq: Every day | NASAL | Status: DC
  Administered 2019-09-05 – 2019-09-06 (×2): 1 via NASAL
  Filled 2019-09-05: qty 16

## 2019-09-05 MED ORDER — ACETAMINOPHEN 325 MG PO TABS
650.0000 mg | ORAL_TABLET | ORAL | Status: DC
  Administered 2019-09-05 – 2019-09-06 (×6): 650 mg via ORAL
  Filled 2019-09-05 (×6): qty 2

## 2019-09-05 MED ORDER — POLYETHYLENE GLYCOL 3350 17 G PO PACK
17.0000 g | PACK | Freq: Every day | ORAL | Status: DC
  Administered 2019-09-05: 17 g via ORAL
  Filled 2019-09-05 (×2): qty 1

## 2019-09-05 MED ORDER — SENNOSIDES-DOCUSATE SODIUM 8.6-50 MG PO TABS: 1.0000 | ORAL_TABLET | Freq: Every evening | ORAL | Status: DC | PRN

## 2019-09-05 NOTE — Care Management Obs Status (Signed)
MEDICARE OBSERVATION STATUS NOTIFICATION   Patient Details  Name: Kathleen Santana MRN: [id number] Date of Birth: [birth year]   Medicare Observation Status Notification Given:  Yes    Deveron Furlong, RN 09/05/2019, 1:35 PM

## 2019-09-05 NOTE — Care Management CC44 (Signed)
Condition Code 44 Documentation Completed  Patient Details  Name: Kathleen Santana MRN: [id number] Date of Birth: 25-Jan-1926   Condition Code 44 given:  Yes Patient signature on Condition Code 44 notice:  Yes Documentation of 2 MD's agreement:  Yes Code 44 added to claim:  Yes    Deveron Furlong, RN 09/05/2019, 1:35 PM

## 2019-09-05 NOTE — Evaluation (Signed)
Physical Therapy Evaluation Patient Details Name: Kathleen Santana MRN: [id number] DOB: 01-14-1927 Today's Date: 09/05/2019   History of Present Illness  Kathleen Santana is a 86 y/o F with a PMHx of HTN, hypothyroidism, bilateral lower extremity neuropathy, GERD who was brought in by EMS after falling out of bed early this morning and having right sided head and right sided hip pain.  Clinical Impression  Patient received in bed, pleasant, alert, oriented. Agrees to PT assessment. Patient is mod independent with bed mobility and transfers. Ambulated 30 feet in room with RW and min guard/supervision. She has good balance, requires cues for use of ad. Pain limited. She will continue to benefit from skilled PT while here to improve functional mobility and independence to return home.     Follow Up Recommendations No PT follow up    Equipment Recommendations  Rolling walker with 5" wheels    Recommendations for Other Services       Precautions / Restrictions Precautions Precautions: Fall Restrictions Weight Bearing Restrictions: Yes RLE Weight Bearing: Weight bearing as tolerated      Mobility  Bed Mobility Overal bed mobility: Modified Independent             General bed mobility comments: increased time, use of bed rails  Transfers Overall transfer level: Modified independent Equipment used: None                Ambulation/Gait Ambulation/Gait assistance: Min guard Gait Distance (Feet): 30 Feet Assistive device: Rolling walker (2 wheeled) Gait Pattern/deviations: Step-to pattern;Decreased stride length;Decreased weight shift to right Gait velocity: decr   General Gait Details: steady, limited by pain  Stairs            Wheelchair Mobility    Modified Rankin (Stroke Patients Only)       Balance Overall balance assessment: Modified Independent                                           Pertinent Vitals/Pain Pain Assessment:  0-10 Pain Score: 7  Pain Location: R hip with ambulation Pain Descriptors / Indicators: Aching;Sore Pain Intervention(s): Monitored during session;Limited activity within patient's tolerance    Home Living Family/patient expects to be discharged to:: Private residence Living Arrangements: Alone Available Help at Discharge: Family;Available PRN/intermittently Type of Home: House Home Access: Stairs to enter   Entrance Stairs-Number of Steps: 1 Home Layout: One level Home Equipment: Bedside commode;Shower seat      Prior Function Level of Independence: Independent         Comments: walked 3 miles a day     Hand Dominance        Extremity/Trunk Assessment   Upper Extremity Assessment Upper Extremity Assessment: Overall WFL for tasks assessed    Lower Extremity Assessment Lower Extremity Assessment: Generalized weakness;RLE deficits/detail RLE Sensation: WNL RLE Coordination: decreased gross motor    Cervical / Trunk Assessment Cervical / Trunk Assessment: Normal  Communication   Communication: HOH  Cognition Arousal/Alertness: Awake/alert Behavior During Therapy: WFL for tasks assessed/performed Overall Cognitive Status: Within Functional Limits for tasks assessed                                        General Comments      Exercises     Assessment/Plan  PT Assessment Patient needs continued PT services  PT Problem List Decreased strength;Decreased mobility;Decreased activity tolerance;Pain;Decreased knowledge of use of DME       PT Treatment Interventions DME instruction;Therapeutic activities;Gait training;Therapeutic exercise;Patient/family education;Stair training;Functional mobility training    PT Goals (Current goals can be found in the Care Plan section)  Acute Rehab PT Goals Patient Stated Goal: to return home, get back to walking PT Goal Formulation: With patient Time For Goal Achievement: 09/09/19 Potential to Achieve  Goals: Good    Frequency Min 3X/week   Barriers to discharge Decreased caregiver support planning to stay with daughter for a couple of days    Co-evaluation               AM-PAC PT "6 Clicks" Mobility  Outcome Measure Help needed turning from your back to your side while in a flat bed without using bedrails?: None Help needed moving from lying on your back to sitting on the side of a flat bed without using bedrails?: None Help needed moving to and from a bed to a chair (including a wheelchair)?: A Little Help needed standing up from a chair using your arms (e.g., wheelchair or bedside chair)?: A Little Help needed to walk in hospital room?: A Little Help needed climbing 3-5 steps with a railing? : A Little 6 Click Score: 20    End of Session Equipment Utilized During Treatment: Gait belt Activity Tolerance: Patient tolerated treatment well;Patient limited by pain Patient left: in chair;with chair alarm set;with call bell/phone within reach Nurse Communication: Mobility status PT Visit Diagnosis: Muscle weakness (generalized) (M62.81);Difficulty in walking, not elsewhere classified (R26.2);History of falling (Z91.81);Pain Pain - Right/Left: Right Pain - part of body: Hip    Time: 1000-1024 PT Time Calculation (min) (ACUTE ONLY): 24 min   Charges:   PT Evaluation $PT Eval Moderate Complexity: 1 Mod PT Treatments $Gait Training: 8-22 mins        Camry Robello, PT, GCS 09/05/19,10:37 AM

## 2019-09-05 NOTE — Plan of Care (Signed)
  Problem: Pain Managment: Goal: General experience of comfort will improve Outcome: Progressing   Problem: Safety: Goal: Ability to remain free from injury will improve Outcome: Progressing   Problem: Skin Integrity: Goal: Risk for impaired skin integrity will decrease Outcome: Progressing   

## 2019-09-05 NOTE — Plan of Care (Signed)

## 2019-09-05 NOTE — Evaluation (Addendum)
Occupational Therapy Evaluation Patient Details Name: Kathleen Santana MRN: [id number] DOB: 1927-01-03 Today's Date: 09/05/2019    History of Present Illness Zenith Kercheval is a 86 y/o F with a PMHx of HTN, hypothyroidism, bilateral lower extremity neuropathy, GERD who was brought in by EMS after falling out of bed early this morning and having right sided head and right sided hip pain.   Clinical Impression   PTA patient independent and driving. Admitted for above and limited by problem list below, including R hip pain, impaired balance and decreased activity tolerance. Patient completing transfers using RW with min guard, in room mobility with min guard using RW, UB ADLs with setup assist and LB ADLs with min guard assist.  Believe patient will benefit from continued OT services acutely in order to optimize independence and safety with ADLs, but anticipate no further needs after dc home.     Follow Up Recommendations  No OT follow up;Supervision - Intermittent    Equipment Recommendations  None recommended by OT    Recommendations for Other Services       Precautions / Restrictions Precautions Precautions: Fall Restrictions Weight Bearing Restrictions: Yes RLE Weight Bearing: Weight bearing as tolerated      Mobility Bed Mobility               General bed mobility comments: OOB in recliner upon entry  Transfers Overall transfer level: Needs assistance   Transfers: Sit to/from Stand Sit to Stand: Min guard         General transfer comment: min guard initally due to pain, cueing for hand placement; fading to supervision     Balance Overall balance assessment: Needs assistance Sitting-balance support: No upper extremity supported;Feet supported Sitting balance-Leahy Scale: Good     Standing balance support: Bilateral upper extremity supported;No upper extremity supported;During functional activity Standing balance-Leahy Scale: Fair Standing balance  comment: relies on BUE support dynamically but able to engage in ADLs at this sink statically                           ADL either performed or assessed with clinical judgement   ADL Overall ADL's : Needs assistance/impaired     Grooming: Supervision/safety;Standing   Upper Body Bathing: Set up;Sitting   Lower Body Bathing: Min guard;Sit to/from stand   Upper Body Dressing : Set up;Sitting   Lower Body Dressing: Min guard;Sit to/from stand Lower Body Dressing Details (indicate cue type and reason): able to reach feet to manage socks, min guard sit to stand  Toilet Transfer: Min guard;Ambulation;RW;Comfort height toilet           Functional mobility during ADLs: Min guard;Rolling walker General ADL Comments: pt limited by pain, impaired balance     Vision Baseline Vision/History: Wears glasses Wears Glasses: At all times Patient Visual Report: No change from baseline Vision Assessment?: No apparent visual deficits     Perception     Praxis      Pertinent Vitals/Pain Pain Assessment: Faces Faces Pain Scale: Hurts even more Pain Location: R hip with movement Pain Descriptors / Indicators: Aching;Sore Pain Intervention(s): Monitored during session;Repositioned;Limited activity within patient's tolerance     Hand Dominance Right   Extremity/Trunk Assessment Upper Extremity Assessment Upper Extremity Assessment: Overall WFL for tasks assessed   Lower Extremity Assessment Lower Extremity Assessment: Defer to PT evaluation   Cervical / Trunk Assessment Cervical / Trunk Assessment: Normal   Communication Communication Communication: [facility]   Cognition  Arousal/Alertness: Awake/alert Behavior During Therapy: WFL for tasks assessed/performed Overall Cognitive Status: Within Functional Limits for tasks assessed                                     General Comments  family present and supportive    Exercises     Shoulder Instructions       Home Living Family/patient expects to be discharged to:: Private residence Living Arrangements: Alone Available Help at Discharge: Family;Available PRN/intermittently Type of Home: House Home Access: Stairs to enter [employer] of Steps: 1   Home Layout: One level     Bathroom Shower/Tub: Producer, television/film/video: Standard     Home Equipment: Bedside commode;Shower seat          Prior Functioning/Environment Level of Independence: Independent        Comments: walked 3 miles a day        OT Problem List: Decreased activity tolerance;Impaired balance (sitting and/or standing);Decreased knowledge of use of DME or AE;Decreased knowledge of precautions;Pain      OT Treatment/Interventions: Self-care/ADL training;DME and/or AE instruction;Therapeutic activities;Patient/family education;Balance training;Energy conservation    OT Goals(Current goals can be found in the care plan section) Acute Rehab OT Goals Patient Stated Goal: to return home, get back to walking OT Goal Formulation: With patient Time For Goal Achievement: 09/19/19 Potential to Achieve Goals: Good  OT Frequency: Min 2X/week   Barriers to D/C:            Co-evaluation              AM-PAC OT "6 Clicks" Daily Activity     Outcome Measure Help from another person eating meals?: None Help from another person taking care of personal grooming?: A Little Help from another person toileting, which includes using toliet, bedpan, or urinal?: A Little Help from another person bathing (including washing, rinsing, drying)?: A Little Help from another person to put on and taking off regular upper body clothing?: A Little Help from another person to put on and taking off regular lower body clothing?: A Little 6 Click Score: 19   End of Session Equipment Utilized During Treatment: Rolling walker Nurse Communication: Mobility status  Activity Tolerance: Patient tolerated treatment  well Patient left: in chair;with call bell/phone within reach;with family/visitor present  OT Visit Diagnosis: Other abnormalities of gait and mobility (R26.89);Muscle weakness (generalized) (M62.81);Pain Pain - Right/Left: Right Pain - part of body: Hip                Time: 3646-8032 OT Time Calculation (min): 20 min Charges:  OT General Charges $OT Visit: 1 Visit OT Evaluation $OT Eval Low Complexity: 1 Low  Barry Brunner, OT Acute Rehabilitation Services Pager [phone] Office [phone]    Chancy Milroy 09/05/2019, 2:52 PM

## 2019-09-05 NOTE — Care Management Obs Status (Deleted)
MEDICARE OBSERVATION STATUS NOTIFICATION   Patient Details  Name: Kathleen Santana MRN: [id number] Date of Birth: 09/24/1926   Medicare Observation Status Notification Given:  Yes    Cassia Fein, RN 09/05/2019, 1:35 PM 

## 2019-09-05 NOTE — Progress Notes (Signed)
   Subjective:  Kathleen Santana is a 86 y.o. with PMH of HTN, hypothyroidism, neuropathy, gerd admit for pelvic fracture on hospital day 0  Patient seen at bedside this AM. Says she's doing okay this morning, feeling better after eating. States her pain is controlled when she sits still, but increases with movement. No recent BM, but reports appropriate urination. Discussed plan for PT/OT.  Objective:  Vital signs in last 24 hours: Vitals:   09/04/19 2226 09/05/19 0400 09/05/19 0600 09/05/19 0739  BP: (!) 143/89 131/66  136/72  Pulse: 89 81  78  Resp: 18 18  15   Temp: 98.7 F (37.1 C) 98.1 F (36.7 C)  (!) 97.3 F (36.3 C)  TempSrc: Oral Oral  Oral  SpO2: 99% 96%    Weight:   53.1 kg   Height:   5\' 2"  (1.575 m)    Gen: Well-developed, well nourished, NAD Neck: supple, ROM intact CV: RRR, S1, S2 normal, No rubs, no murmurs, no gallops Pulm: CTAB, No rales, no wheezes, transmitted upper airway noises Extm: Strength 5/5 on bilateral lower extremities, Peripheral pulses intact, No peripheral edema Skin: Dry, Warm, normal turgor.  Neuro: AAOx3  Assessment/Plan:  Active Problems:   Pubic ramus fracture (HCC)   Osteoporosis   Pelvic fracture (HCC)  Kathleen Santana is a 86 y/o F with a PMHx of HTN, AFIB, Hypothyroidism, Lower Extremity Neuropathy, Osteoperosis, GERD, and chronic sinusitis admit for pelvic fracture  R sacram & pubic rami fracture Osteoporosis Likely due to fragility fracture after fall from bed. Currently pain well controlled on oxycodone. CT head/ribs negative for other fractures. Awaiting PT/OT eval to assess for further rehab needs. Unable to continue with Prolia per chart review due to cost. - F/u PT/OT - Scheduled tylenol 650mg , Oxycodone 5mg  PRN - Start bisphosphonate therapy at discharge  HTN Am bp 136/73 - C/w home meds: lisinopril 2.5mg  daily  Chronic rhinitis/sinusitis Mentions hx of chronic rhinitis w/ post-nasal drip - Start Flonase -  C/w ipratropium  HTN On levothyxoein [place] Holter at home - C/ whome meds  Peripheral neuropathy On gabapentin 300mg  TID - C/w home meds   DVT prophx: Lovenox Diet: HH Bowel: Miralax Code: Full  Prior to Admission Living Arrangement: Home Anticipated Discharge Location: Home Barriers to Discharge: medical work-up Dispo: Anticipated discharge in approximately 0-1 day(s).   82, MD 09/05/2019, 11:28 AM Pager: [phone] After 5pm on weekdays and 1pm on weekends: On Call Pager: [phone]

## 2019-09-06 DIAGNOSIS — I10 Essential (primary) hypertension: Secondary | ICD-10-CM | POA: Diagnosis not present

## 2019-09-06 DIAGNOSIS — S3210XA Unspecified fracture of sacrum, initial encounter for closed fracture: Secondary | ICD-10-CM | POA: Diagnosis not present

## 2019-09-06 DIAGNOSIS — S32591A Other specified fracture of right pubis, initial encounter for closed fracture: Secondary | ICD-10-CM | POA: Diagnosis not present

## 2019-09-06 DIAGNOSIS — G629 Polyneuropathy, unspecified: Secondary | ICD-10-CM | POA: Diagnosis not present

## 2019-09-06 LAB — BASIC METABOLIC PANEL
Anion gap: 9 (ref 5–15)
BUN: 20 mg/dL (ref 8–23)
CO2: 23 mmol/L (ref 22–32)
Calcium: 8.5 mg/dL — ABNORMAL LOW (ref 8.9–10.3)
Chloride: 104 mmol/L (ref 98–111)
Creatinine, Ser: 1.28 mg/dL — ABNORMAL HIGH (ref 0.44–1.00)
GFR calc Af Amer: 42 mL/min — ABNORMAL LOW (ref >60)
GFR calc non Af Amer: 37 mL/min — ABNORMAL LOW (ref >60)
Glucose, Bld: 100 mg/dL — ABNORMAL HIGH (ref 70–99)
Potassium: 4.1 mmol/L (ref 3.5–5.1)
Sodium: 136 mmol/L (ref 135–145)

## 2019-09-06 LAB — CBC
HCT: 32.6 % — ABNORMAL LOW (ref 36.0–46.0)
Hemoglobin: 10.6 g/dL — ABNORMAL LOW (ref 12.0–15.0)
MCH: 30.3 pg (ref 26.0–34.0)
MCHC: 32.5 g/dL (ref 30.0–36.0)
MCV: 93.1 fL (ref 80.0–100.0)
Platelets: 191 10*3/uL (ref 150–400)
RBC: 3.5 MIL/uL — ABNORMAL LOW (ref 3.87–5.11)
RDW: 14.3 % (ref 11.5–15.5)
WBC: 6.2 10*3/uL (ref 4.0–10.5)
nRBC: 0 % (ref 0.0–0.2)

## 2019-09-06 MED ORDER — OXYCODONE HCL 5 MG PO TABS
5.0000 mg | ORAL_TABLET | Freq: Two times a day (BID) | ORAL | 0 refills | Status: AC | PRN
Start: 2019-09-06 — End: 2019-09-11

## 2019-09-06 MED ORDER — OXYCODONE HCL 5 MG PO TABS
5.0000 mg | ORAL_TABLET | Freq: Two times a day (BID) | ORAL | 0 refills | Status: DC | PRN
Start: 2019-09-06 — End: 2019-09-06

## 2019-09-06 MED ORDER — ACETAMINOPHEN 325 MG PO TABS
650.0000 mg | ORAL_TABLET | Freq: Four times a day (QID) | ORAL | 0 refills | Status: DC | PRN
Start: 2019-09-06 — End: 2020-01-28

## 2019-09-06 MED FILL — ACETAMINOPHEN 325 MG TABS: 325 | 3 days supply | Qty: 30 | Fill #0

## 2019-09-06 MED FILL — oxyCODONE HCL 5 MG TABS: 5 | 5 days supply | Qty: 10 | Fill #0

## 2019-09-06 NOTE — TOC Initial Note (Signed)
Transition of Care [facility]) - Initial/Assessment Note    Patient Details  Name: Kathleen Santana MRN: [id number] Date of Birth: [birth year]  [facility] CM/SW Contact:    Curlene Labrum, RN Phone Number: 09/06/2019, 12:55 PM  Clinical Narrative:                 Case management met with the patient and daughter at the bedside prior to discharge home - S/P Right hip pain after fall and pelvic fracture.  The patient was directed to call Dr.  Lyla Glassing for follow up visit appointment.  Spoke with Doroteo Bradford, RN on 5 [address] and she is giving the patient a RW from [facility] [address] supply.  Expected Discharge Plan: Home/Self Care Barriers to Discharge: No Barriers Identified   Patient Goals and CMS Choice Patient states their goals for this hospitalization and ongoing recovery are:: Plans to discharge home with daughter. CMS Medicare.gov Compare Post Acute Care list provided to:: Patient Choice offered to / list presented to : Patient  Expected Discharge Plan and Services Expected Discharge Plan: Home/Self Care   Discharge Planning Services: CM Consult   Living arrangements for the past 2 months: Single Family Home Expected Discharge Date: 09/06/19               DME Arranged: Gilford Rile rolling DME Agency: [employer] (Patient given rolling walker from [facility] [address] supply) Date DME Agency Contacted: 09/06/19   Representative spoke with at DME Agency: Doroteo Bradford, Rn is giving the patient a rolling walker from floor supply.            Prior Living Arrangements/Services Living arrangements for the past 2 months: Single Family Home Lives with:: Self (Patient is planning to discharge home with her daughter.) Patient language and need for interpreter reviewed:: Yes Do you feel safe going back to the place where you live?: Yes      Need for Family Participation in Patient Care: Yes (Comment)     Criminal Activity/Legal Involvement Pertinent to Current Situation/Hospitalization: No - Comment as  needed  Activities of Daily Living Home Assistive Devices/Equipment: None ADL Screening (condition at time of admission) Patient's cognitive ability adequate to safely complete daily activities?: Yes Is the patient deaf or have difficulty hearing?: Yes Does the patient have difficulty seeing, even when wearing glasses/contacts?: No Does the patient have difficulty concentrating, remembering, or making decisions?: No Patient able to express need for assistance with ADLs?: Yes Does the patient have difficulty dressing or bathing?: Yes Independently performs ADLs?: Yes (appropriate for developmental age) Does the patient have difficulty walking or climbing stairs?: Yes Weakness of Legs: Both Weakness of Arms/Hands: None  Permission Sought/Granted Permission sought to share information with : Case Manager Permission granted to share information with : Yes, Verbal Permission Granted        Permission granted to share info w Relationship: daughter Jackelyn Poling     Emotional Assessment Appearance:: Appears stated age Attitude/Demeanor/Rapport: Gracious Affect (typically observed): Accepting Orientation: : Oriented to Self, Oriented to Place, Oriented to  Time, Oriented to Situation Alcohol / Substance Use: Not Applicable Psych Involvement: No (comment)  Admission diagnosis:  Pubic ramus fracture ([place]) [S32.599A] Fall, initial encounter [W19.XXXA] Closed fracture of ramus of left pubis, initial encounter ([place]) [S32.592A] Pelvic fracture ([place]) [S32.9XXA] Patient Active Problem List   Diagnosis Date Noted  . Pelvic fracture ([place]) 09/05/2019  . Pubic ramus fracture ([place]) 09/04/2019  . Osteoporosis 09/04/2019   PCP:  Wayland Salinas, MD Pharmacy:  [facility], [place] [address] [address] Phone: [phone] Fax: [phone]  [facility] [place] [place] - [address] [place] [address] Phone: [phone] Fax: [phone]     Social Determinants of Health (SDOH) Interventions    Readmission Risk Interventions Readmission Risk Prevention Plan 09/06/2019  Post Dischage Appt Complete  Medication Screening Complete  Transportation Screening Complete  Some recent data might be hidden

## 2019-09-06 NOTE — Discharge Summary (Signed)
Name: Kathleen Santana MRN: [id number] DOB: 09-06-1926 86 y.o. PCP: Deloris Ping, MD  Date of Admission: 09/04/2019 10:19 AM Date of Discharge: 09/06/2019 Attending Physician: Earl Lagos MD  Discharge Diagnosis: 1. R pelvic fracture 2. CKD3  Discharge Medications: Allergies as of 09/06/2019      Reactions   Other Other (See Comments), Cough   Seasonal allergies- congestion and itchy eyes, also      Medication List    TAKE these medications   acetaminophen 325 MG tablet Commonly known as: TYLENOL Take 2 tablets (650 mg total) by mouth every 6 (six) hours as needed.   gabapentin 300 MG capsule Commonly known as: NEURONTIN Take 300 mg by mouth in the morning, at noon, in the evening, and at bedtime.   ipratropium 0.03 % nasal spray Commonly known as: ATROVENT Place 1-3 sprays into both nostrils in the morning and at bedtime.   levothyroxine 50 MCG tablet Commonly known as: SYNTHROID Take 50 mcg by mouth daily before breakfast.   lisinopril 2.5 MG tablet Commonly known as: ZESTRIL Take 2.5 mg by mouth at bedtime.   omeprazole 20 MG capsule Commonly known as: PRILOSEC Take 20 mg by mouth daily as needed (before eating spicy/hot foods).   One-A-Day Womens 50+ Advantage Tabs Take 1 tablet by mouth daily with breakfast.   oxyCODONE 5 MG immediate release tablet Commonly known as: Oxy IR/ROXICODONE Take 1 tablet (5 mg total) by mouth every 12 (twelve) hours as needed for up to 5 days for moderate pain.   VITAMIN D-3 PO Take 1 capsule by mouth daily with breakfast.       Disposition and follow-up:   Ms.Kathleen Santana was discharged from [facility] in Stable condition.  At the hospital follow up visit please address:  1.  R pelvic fracture - Discharged w/ 5 day course of oxy and tylenol for pain control - Assess for adequate pain control - No PT/OT follow up recommended at d/c - Consider starting bisphosphonate therapy 2  week after fracture  2. CKD3 - At discharge slight elevation in creatinine at 1.28 - Check bmp - Considering holding lisinopril if worsening AKI  2.  Labs / imaging needed at time of follow-up: bmp  3.  Pending labs/ test needing follow-up: N/A  Follow-up Appointments:  Follow-up Information    Ryter-Brown, Fritzi Mandes, MD. Call.   Specialty: Family Medicine Contact information: [address] [place] [address] [phone]        Samson Frederic, MD. Schedule an appointment as soon as possible for a visit.   Specialty: Orthopedic Surgery Why: Please make a followup appointment to see Dr. Linna Caprice at his office. Contact information: [address] [place] [address] [phone]              Hospital Course by problem list:  1. R pelvic fracture: Ms.Kathleen Santana is a 86 yo F w/ PMH of HTN, Hypothyroidism, peripheral neuropathy and GERD who presented after fall from her bed. She hit her head and hip. CT head and cervical spine did not show any acute findings. CT hip was significant for acute non-displaced fracture of right sacrum and right superior & inferior pubic rami. She was admit for pain control w/ tylenol and oxycodone. She was evaluated by PT/OT and found to have no further therapy needs. Discharged w/ recommendation to f/u with PCP for further management of osteoporosis.  2. CKD3: Noted to have mild bump in creatinine during  admission. Discharged w/ recommendation to f/u with PCP.  Discharge Vitals:   BP 140/64   Pulse 67   Temp 98 F (36.7 C) (Oral)   Resp 18   Ht 5\' 2"  (1.575 m)   Wt 53.3 kg   SpO2 95%   BMI 21.49 kg/m   Pertinent Labs, Studies, and Procedures:  CBC Latest Ref Rng & Units 09/06/2019 09/05/2019 09/04/2019  WBC 4.0 - 10.5 K/uL 6.2 8.6 10.7(H)  Hemoglobin 12.0 - 15.0 g/dL 10.6(L) 11.6(L) 13.4  Hematocrit 36 - 46 % 32.6(L) 34.4(L) 41.3  Platelets 150 - 400 K/uL 191 210 250   BMP Latest Ref Rng &  Units 09/06/2019 09/05/2019 09/04/2019  Glucose 70 - 99 mg/dL 09/06/2019) 712(W) 580(D)  BUN 8 - 23 mg/dL 20 18 15   Creatinine 0.44 - 1.00 mg/dL 983(J) ) 8.25(K)  Sodium 135 - 145 mmol/L 136 135 139  Potassium 3.5 - 5.1 mmol/L 4.1 3.7 3.7  Chloride 98 - 111 mmol/L 104 103 103  CO2 22 - 32 mmol/L 23 22 25   Calcium 8.9 - 10.3 mg/dL 5.39(J) 6.73(A) 9.3   CT OF THE RIGHT HIP WITHOUT CONTRAST  FINDINGS: Bones/Joint/Cartilage  An acute, nondisplaced fracture of the right sacrum is identified. Minimally displaced fracture of the superior pubic ramus is also seen. Finally, mild buckling of the cortex of the inferior pubic ramus is consistent with a nondisplaced fracture.  The right hip is and no hip fracture is seen. There is mild degenerative disease about the hip. No lytic or sclerotic lesion is identified.  Ligaments  Suboptimally assessed by CT.  Muscles and Tendons  Intact.  Soft tissues  Subcutaneous contusion adjacent to the right hip noted.  IMPRESSION: Acute fractures of the right sacrum and right superior and inferior pubic rami.  Negative for hip fracture or dislocation.  Soft tissue contusion adjacent to the right hip.  Mild right hip osteoarthritis.  CT HEAD WITHOUT CONTRAST CT CERVICAL SPINE WITHOUT CONTRAST  FINDINGS: CT HEAD FINDINGS  Brain: No evidence of acute infarction, hemorrhage, hydrocephalus, extra-axial collection or mass lesion/mass effect. Signs of atrophy and chronic microvascular ischemic change.  Vascular: No hyperdense vessel or unexpected calcification.  Skull: Normal. Negative for fracture or focal lesion.  Sinuses/Orbits: Near complete opacification of the RIGHT maxillary sinus indicative of chronic sinus disease. Thickening of the walls of the LEFT maxillary sinus also with some mucosal thickening in the LEFT maxillary sinus indicative of chronic sinus disease. Similar changes though less pronounced in the  ethmoid sinuses. Mild involvement of LEFT sphenoid. No air-fluid level. Orbits are unremarkable.  Other: None.  CT CERVICAL SPINE FINDINGS  Alignment: Straightening of normal cervical lordosis likely due to patient position or spasm.  Skull base and vertebrae: No acute fracture. No primary bone lesion or focal pathologic process.  Soft tissues and spinal canal: No prevertebral fluid or swelling. No visible canal hematoma.  Disc levels: Degenerative changes of the atlantoaxial joint with mild pannus formation.  Multilevel degenerative change with disc space narrowing and uncovertebral degenerative changes. Degenerative changes greatest at C5-6 and C6-7 with respect to disc space narrowing and uncovertebral degenerative changes with similar changes seen throughout the cervical spine.  Facet hypertrophy and facet degenerative changes greatest at C4-5 on the LEFT.  Upper chest: Biapical pleural and parenchymal scarring greatest on the RIGHT.  Other: None  IMPRESSION: 1. No acute intracranial abnormality. 2. Signs of atrophy and chronic microvascular ischemic change. 3. Chronic sinusitis with complete opacification in the RIGHT maxillary sinus  4. No evidence for acute fracture or malalignment of the cervical spine. 5. Multilevel spinal degenerative changes. 6. Biapical pleural and parenchymal scarring greatest on the RIGHT.  Aortic Atherosclerosis (ICD10-I70.0).  Discharge Instructions: Discharge Instructions    Call MD for:  difficulty breathing, headache or visual disturbances   Complete by: As directed    Call MD for:  persistant dizziness or light-headedness   Complete by: As directed    Call MD for:  persistant nausea and vomiting   Complete by: As directed    Call MD for:  temperature >100.4   Complete by: As directed    Diet - low sodium heart healthy   Complete by: As directed    Discharge instructions   Complete by: As directed    Dear [name]  You came to [place] with hip fracture. We have determined this was caused by a fall. Here are our recommendations for you at discharge:  Please take tylenol 650mg  every 6 hours for pain Please take oxycodone 5mg  only as needed for severe pain Please follow up with your primary care provider for further needs  Thank you for choosing    Increase activity slowly   Complete by: As directed      Signed: , MD 09/06/2019, 7:45 PM Pager: [phone] After 5pm on weekdays and 1pm on weekends: On Call Pager: [phone]

## 2019-09-06 NOTE — Progress Notes (Signed)
Physical Therapy Treatment Patient Details Name: Kathleen Santana MRN: [id number] DOB: 11-21-1926 Today's Date: 09/06/2019    History of Present Illness Aryah Doering is a 86 y/o F with a PMHx of HTN, hypothyroidism, bilateral lower extremity neuropathy, GERD who was brought in by EMS after falling out of bed early this morning and having right sided head and right sided hip pain. Pt with R sacral and superior/inferior pubic rami fractures, suspect pathologic.    PT Comments    Pt ambulating hallway distance well with use of RW today, requiring min verbal cuing from PT for proper form and safety. Pt proficiently navigated 1 step, which is what pt will have to do to enter daughter's home. Pt educated pt on home walking program and light LE exercise to maintain pt mobility and strength upon d/c. Pt with no further questions, pt plans to d/c home today.    Follow Up Recommendations  No PT follow up     Equipment Recommendations  Rolling walker with 5" wheels    Recommendations for Other Services       Precautions / Restrictions Precautions Precautions: Fall Restrictions Weight Bearing Restrictions: No RLE Weight Bearing: Weight bearing as tolerated    Mobility  Bed Mobility Overal bed mobility: Modified Independent             General bed mobility comments: Mod I for increased time, use of mild HOB elevation.  Transfers Overall transfer level: Needs assistance Equipment used: None Transfers: Sit to/from Stand Sit to Stand: Supervision         General transfer comment: for safety, pt standing without use of RW. Verbal cuing for hand placement when standing, but especially when sitting as pt tends to leave hands on RW.  Ambulation/Gait Ambulation/Gait assistance: Supervision Gait Distance (Feet): 220 Feet Assistive device: Rolling walker (2 wheeled) Gait Pattern/deviations: Step-through pattern;Decreased stride length;Trunk flexed;Antalgic Gait velocity:  mildly decr, pt cautious in speed   General Gait Details: supervision for safety, verbal cuing for shoulder depression, upright posture, placement in RW.   Stairs Stairs: Yes Stairs assistance: Min guard Stair Management: No rails;Step to pattern;With walker Number of Stairs: 1 General stair comments: min guard for safety, PT steadying RW along crossbar and PT encouraged pt's daughter to do the same when ascending/descending. Verbal cuing for sequencing (up with good leg, down with bad leg leading), safe RW placement on step.   Wheelchair Mobility    Modified Rankin (Stroke Patients Only)       Balance Overall balance assessment: Needs assistance Sitting-balance support: No upper extremity supported;Feet supported Sitting balance-Leahy Scale: Good     Standing balance support: No upper extremity supported Standing balance-Leahy Scale: Fair Standing balance comment: able to stand statically without UE support, RW useful dynamically for pt to self-steady                            Cognition Arousal/Alertness: Awake/alert Behavior During Therapy: WFL for tasks assessed/performed Overall Cognitive Status: Within Functional Limits for tasks assessed                                        Exercises Other Exercises Other Exercises: home walking program: up and walking 2-5 minutes, every hour, to promote LE circulation, reduce LE stiffness, and maintain LE strength. PT encouraged to have supervision for mobility, by daughter. Other Exercises:  Standing marches at sink, x5 bilaterally    General Comments        Pertinent Vitals/Pain Pain Assessment: 0-10 Pain Score: 2  Pain Location: R hip, with open chain hip flexion Pain Descriptors / Indicators: Sore;Discomfort Pain Intervention(s): Limited activity within patient's tolerance;Monitored during session    Home Living                      Prior Function            PT Goals  (current goals can now be found in the care plan section) Acute Rehab PT Goals Patient Stated Goal: to return home, get back to walking PT Goal Formulation: With patient Time For Goal Achievement: 09/09/19 Potential to Achieve Goals: Good Progress towards PT goals: Progressing toward goals    Frequency    Min 3X/week      PT Plan Current plan remains appropriate    Co-evaluation              AM-PAC PT "6 Clicks" Mobility   Outcome Measure  Help needed turning from your back to your side while in a flat bed without using bedrails?: None Help needed moving from lying on your back to sitting on the side of a flat bed without using bedrails?: None Help needed moving to and from a bed to a chair (including a wheelchair)?: None Help needed standing up from a chair using your arms (e.g., wheelchair or bedside chair)?: None Help needed to walk in hospital room?: A Little Help needed climbing 3-5 steps with a railing? : A Little 6 Click Score: 22    End of Session Equipment Utilized During Treatment: Gait belt Activity Tolerance: Patient tolerated treatment well;Patient limited by pain Patient left: in chair;with chair alarm set;with call bell/phone within reach Nurse Communication: Mobility status PT Visit Diagnosis: Muscle weakness (generalized) (M62.81);Difficulty in walking, not elsewhere classified (R26.2);History of falling (Z91.81);Pain Pain - Right/Left: Right Pain - part of body: Hip     Time: 4540-9811 PT Time Calculation (min) (ACUTE ONLY): 26 min  Charges:  $Gait Training: 23-37 mins                     Reo Portela E, PT Acute Rehabilitation Services Pager [phone]  Office [phone]   Hasaan Radde D Despina Hidden 09/06/2019, 9:31 AM

## 2019-09-06 NOTE — Discharge Instructions (Signed)
Simple Pelvic Fracture, Adult A pelvic fracture is a break in one of the bones in the pelvis. The pelvic bones include the bones that you sit on and the bones that make up the lower part of your spine. A pelvic fracture is called simple if:  There is only one break.  The broken bone is stable.  The broken bone is not moving out of place.  The bone does not pierce the skin. A pelvic fracture may occur along with injuries to nerves, blood vessels, soft tissues, the urinary tract, and abdominal organs. What are the causes? Common causes of this type of fracture include:  A fall.  A car accident.  Force or pressure that hits the pelvis. What increases the risk? You are more likely to get this injury if you:  Play high-impact sports, such as rugby or football.  You have thinning or weakening of your bones, such as from osteopenia or osteoporosis.  Have cancer that has spread to the bone.  Have a condition that is associated with falling, such as Parkinson's disease or seizure.  Have had a stroke.  Smoke. What are the signs or symptoms? Signs and symptoms may include:  Tenderness, swelling, or bruising in the affected area.  Pain when moving the hip.  Pain when walking or standing. How is this diagnosed? This condition is diagnosed with a physical exam, X-ray, or CT scan. You may also have blood or urine tests:  To rule out damage to other organs, such as the urethra.  To check for internal bleeding in the pelvic area. How is this treated? The goal of treatment is to get the bone to heal in its original position. Treatment includes:  Staying in bed (bed rest).  Using crutches, a walker, or a wheelchair until the bone heals.  Medicines to treat pain.  Medicines to prevent blood clots from forming in your legs.  Physical therapy. Follow these instructions at home: Medicines  Take over-the-counter and prescription medicines only as told by your health care  provider.  Do not drive or use heavy machinery while taking prescription pain medicine. Managing pain, stiffness, and swelling   If directed, apply ice to the injured area: ? Put ice in a plastic bag. ? Place a towel between your skin and the bag. ? Leave the ice on for 20 minutes, 2-3 times a day.  Gently move your toes often to avoid stiffness and to lessen swelling. Activity  Stay on bed rest for as long as directed by your health care provider.  While on bed rest: ? Change the position of your legs every 1-2 hours. This keeps blood moving well through both of your legs. ? You may sit for as long as you feel comfortable.  After bed rest: ? Avoid strenuous activities for as long as directed by your health care provider. ? Return to your normal activities as directed by your health care provider. Ask your health care provider what activities are safe for you.  Use items to help you with your activities, such as: ? A long-handled shoehorn to help you put your shoes on. ? Elastic shoelaces that do not need to be retied. ? A reacher or grabber to pick items up off the floor. General instructions   Do not  drive or operate heavy machinery until your health care provider tells you it is safe to do so.  Use a wheelchair or assistive devices as directed by your health care provider. When you are   ready to walk, start by using crutches or a walker to help support your body weight.  Have someone help you at home as you recover.  Wear compression stockings as told by your health care provider.  Do not use any products that contain nicotine or tobacco, such as cigarettes and e-cigarettes. These can delay bone healing. If you need help quitting, ask your health care provider.  If you have an underlying condition that caused your pelvic fracture, work with your health care provider to manage your condition.  Keep all follow-up visits as told by your health care provider. This is  important. Contact a health care provider if:  Your pain gets worse.  Your pain is not relieved with medicines. Get help right away if you:  Feel light-headed or faint.  Develop chest pain.  Develop shortness of breath.  Have a fever.  Have blood in your urine or your stools.  Have bleeding in your vagina.  Have difficulty or pain with urination or with passing stool.  Have difficulty or increased pain with walking.  Have new or increased swelling in one of your legs.  Have numbness in your legs or groin area. Summary  A pelvic fracture is a break in one of the bones in the pelvis. These are the bones that you sit on and the bones that make up the lower part of your spine.  A pelvic fracture is called simple if there is only one break, the broken bone is stable, the broken bone is not moving out of place, or the bone does not pierce the skin.  Common causes of this type of fracture include a fall, a car accident, or a force or pressure that hits the pelvis.  The goal of treatment is to get the bone to heal in its original position.  Treatment includes bed rest and using a wheelchair. When ready to walk, you may use crutches or a walker until your bone heals. Other treatments include physical therapy and medicine to treat pain and prevent blood clots. This information is not intended to replace advice given to you by your health care provider. Make sure you discuss any questions you have with your health care provider. Document Revised: 08/07/2017 Document Reviewed: 02/10/2017 Elsevier Patient Education  2020 [employer]  

## 2019-09-06 NOTE — Progress Notes (Signed)
   Subjective:  Kathleen Santana is a 86 y.o. with PMH of HTN, hypothyroidism, GERD admit for R pubic ramus fractures on hospital day 1  Kathleen Santana was seen at bedside this AM. She mentions she is doing well, ready to go home. States pain well controlled, only has pain with movement. Discussed risks of opioid medication, including drowsiness and constipation.   Objective:  Vital signs in last 24 hours: Vitals:   09/05/19 1300 09/05/19 1940 09/06/19 0250 09/06/19 0353  BP: 138/74 140/82 122/60   Pulse: 80 70 68   Resp: 16 20 18    Temp: (!) 97.2 F (36.2 C) 98.2 F (36.8 C) 98.3 F (36.8 C)   TempSrc: Oral Oral Oral   SpO2: 98% 100% 96%   Weight:    53.3 kg  Height:       Gen: Well-developed, well nourished, NAD HEENT: NCAT head, hearing intact, EOMI, MMM Pulm: Breathing comfortably on room air, no cough, no distress  Extm:R hip tender to palpation, no ecchymosis, or obvious swelling, RLE strength 5/5 Skin: Dry, Warm, normal turgor Neuro: AAOx3, Answers questions appropriately Psych: Normal mood and affect   Assessment/Plan:  Active Problems:   Pubic ramus fracture (HCC)   Osteoporosis   Pelvic fracture (HCC)  Kathleen Santana is a 86 y/o F with a PMHx of HTN, AFIB, Hypothyroidism, Lower Extremity Neuropathy, Osteoperosis, GERD, and chronic sinusitis admit for pelvic fracture  R sacram & pubic rami fracture Osteoporosis Likely due to fragility fracture after fall from bed. Currently pain well controlled on oxycodone. CT head/ribs negative for other fractures. Unable to continue with Prolia per chart review due to cost. Would be safe to initiate bisphosphonate therapy 2 weeks after fracture if patient agreeable. PT/OT recommend discharge home - Discharge today with pain meds - Scheduled tylenol 650mg , Oxycodone 5mg  PRN - F/u PCP regarding initiation of bisphosphonate therapy  HTN Am bp 136/73 - C/w home meds: lisinopril 2.5mg  daily  Chronic  rhinitis/sinusitis Mentions hx of chronic rhinitis w/ post-nasal drip - Start Flonase - C/w ipratropium  HTN On levothyxoein 82 at home - C/ whome meds  Peripheral neuropathy On gabapentin 300mg  TID - C/w home meds  DVT prophx: lovenox Diet: HH Bowel: Miralax Code: Full  Prior to Admission Living Arrangement: Home Anticipated Discharge Location: home Barriers to Discharge: none Dispo: Anticipated discharge in approximately today(s).   , MD 09/06/2019, 6:42 AM Pager: [phone] After 5pm on weekdays and 1pm on weekends: On Call Pager: [phone]

## 2019-09-06 NOTE — Plan of Care (Signed)

## 2019-09-06 NOTE — Progress Notes (Signed)
IM resident notified and called back.  Stated will call family.

## 2019-09-06 NOTE — Progress Notes (Signed)
Kathleen Santana, patient's son.  Requesting call back.  [phone]

## 2019-09-06 NOTE — Plan of Care (Signed)
  Problem: Pain Managment: Goal: General experience of comfort will improve Outcome: Progressing   Problem: Safety: Goal: Ability to remain free from injury will improve Outcome: Progressing   Problem: Skin Integrity: Goal: Risk for impaired skin integrity will decrease Outcome: Progressing   

## 2019-10-26 ENCOUNTER — Ambulatory Visit: Payer: Medicare HMO | Attending: Internal Medicine

## 2019-10-26 DIAGNOSIS — Z23 Encounter for immunization: Secondary | ICD-10-CM

## 2019-10-26 NOTE — Progress Notes (Signed)
   Covid-19 Vaccination Clinic  Name:  KHALILA BUECHNER    MRN: [id number] DOB: [birth year]  10/26/2019  Ms. Larose was observed post Covid-19 immunization for 15 minutes without incident. She was provided with Vaccine Information Sheet and instruction to access the V-Safe system.   Ms. Bolser was instructed to call 911 with any severe reactions post vaccine: Marland Kitchen Difficulty breathing  . Swelling of face and throat  . A fast heartbeat  . A bad rash all over body  . Dizziness and weakness

## 2020-01-24 ENCOUNTER — Encounter (HOSPITAL_COMMUNITY): Payer: Self-pay | Admitting: Emergency Medicine

## 2020-01-24 ENCOUNTER — Emergency Department (HOSPITAL_COMMUNITY)
Admission: EM | Admit: 2020-01-24 | Discharge: 2020-01-25 | Disposition: A | Discharge status: Home / Self Care | Payer: Medicare HMO | Attending: Emergency Medicine | Admitting: Emergency Medicine

## 2020-01-24 DIAGNOSIS — E039 Hypothyroidism, unspecified: Secondary | ICD-10-CM | POA: Insufficient documentation

## 2020-01-24 DIAGNOSIS — R55 Syncope and collapse: Secondary | ICD-10-CM | POA: Insufficient documentation

## 2020-01-24 DIAGNOSIS — I10 Essential (primary) hypertension: Secondary | ICD-10-CM | POA: Insufficient documentation

## 2020-01-24 DIAGNOSIS — N289 Disorder of kidney and ureter, unspecified: Secondary | ICD-10-CM | POA: Insufficient documentation

## 2020-01-24 DIAGNOSIS — Z79899 Other long term (current) drug therapy: Secondary | ICD-10-CM | POA: Insufficient documentation

## 2020-01-24 DIAGNOSIS — Z87891 Personal history of nicotine dependence: Secondary | ICD-10-CM | POA: Diagnosis not present

## 2020-01-24 LAB — CBC
HCT: 40.4 % (ref 36.0–46.0)
Hemoglobin: 12.7 g/dL (ref 12.0–15.0)
MCH: 30.8 pg (ref 26.0–34.0)
MCHC: 31.4 g/dL (ref 30.0–36.0)
MCV: 97.8 fL (ref 80.0–100.0)
Platelets: 235 10*3/uL (ref 150–400)
RBC: 4.13 MIL/uL (ref 3.87–5.11)
RDW: 13.7 % (ref 11.5–15.5)
WBC: 7.1 10*3/uL (ref 4.0–10.5)
nRBC: 0 % (ref 0.0–0.2)

## 2020-01-24 LAB — BASIC METABOLIC PANEL
Anion gap: 12 (ref 5–15)
BUN: 20 mg/dL (ref 8–23)
CO2: 21 mmol/L — ABNORMAL LOW (ref 22–32)
Calcium: 8.8 mg/dL — ABNORMAL LOW (ref 8.9–10.3)
Chloride: 101 mmol/L (ref 98–111)
Creatinine, Ser: 1.18 mg/dL — ABNORMAL HIGH (ref 0.44–1.00)
GFR, Estimated: 44 mL/min — ABNORMAL LOW (ref >60)
Glucose, Bld: 98 mg/dL (ref 70–99)
Potassium: 4.2 mmol/L (ref 3.5–5.1)
Sodium: 134 mmol/L — ABNORMAL LOW (ref 135–145)

## 2020-01-24 LAB — TROPONIN I (HIGH SENSITIVITY)
Troponin I (High Sensitivity): 13 ng/L (ref <18)
Troponin I (High Sensitivity): 11 ng/L (ref <18)

## 2020-01-24 NOTE — ED Notes (Signed)
Pt reports she felt light headed, daughter assisted her to the floor from the chair and then pt states she "went completely out" for a few minutes. Pt denies hitting her head

## 2020-01-24 NOTE — ED Triage Notes (Signed)
Pt here from home with c/o syncopal episode assisted to floor no complaint s from fall ,

## 2020-01-25 NOTE — Discharge Instructions (Addendum)
Your evaluation in the emergency department did not show a reason why you passed out.  Please keep your appointment with the cardiologist.  He will likely want to run additional test as an outpatient.  If you have any problems at any time, please return to the emergency department for further evaluation.

## 2020-01-25 NOTE — ED Provider Notes (Signed)
[facility] EMERGENCY DEPARTMENT Provider Note   CSN: [id number] Arrival date & time: 01/24/20  1109   History Chief complaint: Syncope  Kathleen Santana is a 87 y.o. female.  The history is provided by the patient.  She has history of hypertension, atrial fibrillation not on any anticoagulation and comes in because of a syncopal episode.  She was walking in the kitchen when she started to feel lightheaded.  She sat down and continued to feel lightheaded and eventually, was laid down on the floor where she did have syncope which is estimated to have lasted about 1 minute.  Following that, she seemed to fade in and out for several minutes before returning to baseline.  She denies chest pain, heaviness, tightness, pressure.  She denies any palpitations.  There was no associated dyspnea, nausea, diaphoresis.  She does not have history of prior syncopal episodes, but did have a fall 5 months ago which resulted in a pelvic fracture.  Of note, since arriving in the emergency department, she has been able to ambulate to the bathroom 3 times without any dizziness or lightheadedness.  Past Medical History:  Diagnosis Date  . Afib (HCC)   . GERD (gastroesophageal reflux disease)   . Hypertension   . Hypothyroidism   . Lower extremity neuropathy   . Osteoporosis   . Rhinitis, non-allergic     Patient Active Problem List   Diagnosis Date Noted  . Pelvic fracture (HCC) 09/05/2019  . Pubic ramus fracture (HCC) 09/04/2019  . Osteoporosis 09/04/2019    History reviewed. No pertinent surgical history.   OB History   No obstetric history on file.     History reviewed. No pertinent family history.  Social History   Tobacco Use  . Smoking status: Former Smoker    Types: Cigarettes  . Smokeless tobacco: Never Used    Home Medications Prior to Admission medications   Medication Sig Start Date End Date Taking? Authorizing Provider  acetaminophen (TYLENOL) 325 MG  tablet Take 2 tablets (650 mg total) by mouth every 6 (six) hours as needed. 09/06/19   Theotis Barrio, MD  Cholecalciferol (VITAMIN D-3 PO) Take 1 capsule by mouth daily with breakfast.    [provider]  gabapentin (NEURONTIN) 300 MG capsule Take 300 mg by mouth in the morning, at noon, in the evening, and at bedtime.    [provider]  ipratropium (ATROVENT) 0.03 % nasal spray Place 1-3 sprays into both nostrils in the morning and at bedtime.    [provider]  levothyroxine (SYNTHROID) 50 MCG tablet Take 50 mcg by mouth daily before breakfast.    [provider]  lisinopril (ZESTRIL) 2.5 MG tablet Take 2.5 mg by mouth at bedtime.    [provider]  Multiple Vitamins-Minerals (ONE-A-DAY WOMENS 50+ ADVANTAGE) TABS Take 1 tablet by mouth daily with breakfast.    [provider]  omeprazole (PRILOSEC) 20 MG capsule Take 20 mg by mouth daily as needed (before eating spicy/hot foods).     [provider]    Allergies    Other  Review of Systems   Review of Systems  All other systems reviewed and are negative.   Physical Exam Updated Vital Signs BP (!) 156/85   Pulse 75   Temp 98.1 F (36.7 C) (Oral)   Resp (!) 23   Ht 5\' 2"  (1.575 m)   Wt 54 kg   SpO2 100%   BMI 21.77 kg/m   Physical  Exam Vitals and nursing note reviewed.   87 year old female, resting comfortably and in no acute distress. Vital signs are significant for elevated blood pressure. Oxygen saturation is 100%, which is normal. Head is normocephalic and atraumatic. PERRLA, EOMI. Oropharynx is clear. Neck is nontender and supple without adenopathy or JVD.  There are no carotid bruits. Back is nontender and there is no CVA tenderness. Lungs are clear without rales, wheezes, or rhonchi. Chest is nontender. Heart has regular rate and rhythm without murmur. Abdomen is soft, flat, nontender without masses or hepatosplenomegaly and peristalsis is  normoactive. Extremities have no cyanosis or edema, full range of motion is present. Skin is warm and dry without rash. Neurologic: Mental status is normal, cranial nerves are intact, there are no motor or sensory deficits.  ED Results / Procedures / Treatments   Labs (all labs ordered are listed, but only abnormal results are displayed) Labs Reviewed  BASIC METABOLIC PANEL - Abnormal; Notable for the following components:      Result Value   Sodium 134 (*)    CO2 21 (*)    Creatinine, Ser 1.18 (*)    Calcium 8.8 (*)    GFR, Estimated 44 (*)    All other components within normal limits  CBC  TROPONIN I (HIGH SENSITIVITY)  TROPONIN I (HIGH SENSITIVITY)    EKG EKG Interpretation  Date/Time:  Monday January 24 2020 11:18:27 EST Ventricular Rate:  81 PR Interval:  156 QRS Duration: 74 QT Interval:  384 QTC Calculation: 446 R Axis:   -35 Text Interpretation: Normal sinus rhythm Left axis deviation Septal infarct , age undetermined Abnormal ECG When compared with ECG of 09/04/2019, No significant change was found Confirmed by Dione Booze ([id number]) on 01/24/2020 11:40:42 PM  Procedures Procedures   Medications Ordered in ED Medications - No data to display  ED Course  I have reviewed the triage vital signs and the nursing notes.  Pertinent lab results that were available during my care of the patient were reviewed by me and considered in my medical decision making (see chart for details).  MDM Rules/Calculators/A&P Syncope of uncertain cause, but no red flags to suggest more serious causes of syncope.  ECG shows no acute changes.  Labs show mild renal insufficiency which is unchanged from baseline.  Troponin is normal x2.  She is low risk based on [place] syncope rule and [place] syncope risk score.  She has been observed in the ED for over 15 hours with no recurrence of symptoms.  She is felt to be safe for discharge.  Old records were reviewed confirming hospitalization  for pelvic fracture in August 2021.  She has an appointment already scheduled with a cardiologist for 1/14, she is to keep that appointment.  Return precautions discussed.  Final Clinical Impression(s) / ED Diagnoses Final diagnoses:  Syncope, unspecified syncope type  Renal insufficiency    Rx / DC Orders ED Discharge Orders    None       Dione Booze, MD 01/25/20 [phone]

## 2020-01-27 NOTE — Progress Notes (Signed)
Cardiology Office Note:   Date:  01/28/2020  NAME:  Kathleen Santana    MRN: [id number] DOB:  01/25/1926   PCP:  Deloris Pingyter-Brown, Sherry M, MD  Cardiologist:  No primary care provider on file.   Referring MD: Deloris Pingyter-Brown, Sherry M, *   Chief Complaint  Patient presents with  . Loss of Consciousness        History of Present Illness:   Kathleen Santana is a 87 y.o. female with a hx of GERD, hypothyroidism, HTN who is being seen today for the evaluation of atrial fibrillation at the request of Ryter-Brown, Fritzi MandesSherry M, MD. Recent ER visit 01/24/2019 for syncope. History of Afib but all EKGs shows NSR.  She reports she had a syncopal episode on Monday of this week.  This occurred around 9 AM.  It occurred while walking in her kitchen.  She had not had anything to eat or drink that morning.  She reports she was lightheaded and felt a bit dizzy.  She told her daughter who was in the room with her.  They apparently sat her down and she passed out.  Her daughter reports she had 3 episodes where she passed out.  She did not fall or hit her head.  The episodes lasted 30 seconds.  3 in total.  She reports she came to by the time EMS arrived.  She was back to her normal self within minutes.  No seizure-like activity.  No urination or defecation on her self.  No chest pain, shortness of breath or palpitations reported before the event.  They were told by EMS she had rapid atrial fibrillation.  I did review the strips from EMS.  There was no atrial fibrillation.  In the emergency room troponins were negative.  No cerebral imaging was performed.  Her EKG is there were normal sinus rhythm.  EKG today in office is normal sinus rhythm.  Regarding her atrial fibrillation.  Apparently 20 years ago she had an episode of A. fib.  She is had no further recurrence.  She is not on anticoagulation.  I can see no further recurrence of her atrial fibrillation.  This was in the setting of not feeling well.  She reports she was  given medication and has never had an episode of A. fib.  Medical history significant for hypothyroidism.  She takes medication for this.  Her blood pressure also has been a bit elevated at times.  BP in office 162/82.  They keep a close eye on it.  She is never had a heart attack or stroke.  Cardiovascular examination is normal.  She is a former smoker.  She does not drink alcohol or use drugs.  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  She exercises every other day walking up to 3 miles without any chest pain, shortness of breath or palpitations.  Cardiovascular examination is normal.  Past Medical History: Past Medical History:  Diagnosis Date  . Afib (HCC)   . Arrhythmia   . GERD (gastroesophageal reflux disease)   . Hypertension   . Hypothyroidism   . Lower extremity neuropathy   . Osteoporosis   . Rhinitis, non-allergic     Past Surgical History: Past Surgical History:  Procedure Laterality Date  . BACK SURGERY    . NOSE SURGERY      Current Medications: Current Meds  Medication Sig  . Cholecalciferol (VITAMIN D-3 PO) Take 1 capsule by mouth  daily with breakfast.  . gabapentin (NEURONTIN) 300 MG capsule Take 300 mg by mouth in the morning, at noon, in the evening, and at bedtime.  Marland Kitchen ipratropium (ATROVENT) 0.03 % nasal spray Place 1-3 sprays into both nostrils in the morning and at bedtime.  Marland Kitchen levothyroxine (SYNTHROID) 50 MCG tablet Take 50 mcg by mouth daily before breakfast.  . lisinopril (ZESTRIL) 2.5 MG tablet Take 2.5 mg by mouth at bedtime.  . Multiple Vitamins-Minerals (ONE-A-DAY WOMENS 50+ ADVANTAGE) TABS Take 1 tablet by mouth daily with breakfast.  . omeprazole (PRILOSEC) 20 MG capsule Take 20 mg by mouth daily as needed (before eating spicy/hot foods).      Allergies:    Other   Social History: Social History   Socioeconomic History  . Marital status: Widowed    Spouse name: Not on file  . Number of children: 2  .  Years of education: Not on file  . Highest education level: Not on file  Occupational History  . Occupation: retired  Tobacco Use  . Smoking status: Former Smoker    Years: 16.00    Types: Cigarettes  . Smokeless tobacco: Never Used  Substance and Sexual Activity  . Alcohol use: Never  . Drug use: Never  . Sexual activity: Not on file  Other Topics Concern  . Not on file  Social History Narrative  . Not on file   Social Determinants of Health   Financial Resource Strain: Not on file  Food Insecurity: Not on file  Transportation Needs: Not on file  Physical Activity: Not on file  Stress: Not on file  Social Connections: Not on file     Family History: The patient's family history includes Arrhythmia in her mother; Heart disease in her father.  ROS:   All other ROS reviewed and negative. Pertinent positives noted in the HPI.     EKGs/Labs/Other Studies Reviewed:   The following studies were personally reviewed by me today:  EKG:  EKG is ordered today.  The ekg ordered today demonstrates sinus rhythm heart rate 75, no acute ischemic changes, PAC noted, and was personally reviewed by me.   Recent Labs: 01/24/2020: BUN 20; Creatinine, Ser 1.18; Hemoglobin 12.7; Platelets 235; Potassium 4.2; Sodium 134   Recent Lipid Panel No results found for: CHOL, TRIG, HDL, CHOLHDL, VLDL, LDLCALC, LDLDIRECT  Physical Exam:   VS:  BP (!) 162/82   Pulse 75   Ht 5\' 2"  (1.575 m)   Wt 119 lb 6.4 oz (54.2 kg)   SpO2 100%   BMI 21.84 kg/m    Wt Readings from Last 3 Encounters:  01/28/20 119 lb 6.4 oz (54.2 kg)  01/24/20 119 lb (54 kg)  09/06/19 117 lb 8.1 oz (53.3 kg)    General: Well nourished, well developed, in no acute distress Head: Atraumatic, normal size  Eyes: PEERLA, EOMI  Neck: Supple, no JVD Endocrine: No thryomegaly Cardiac: Normal S1, S2; RRR; no murmurs, rubs, or gallops Lungs: Clear to auscultation bilaterally, no wheezing, rhonchi or rales  Abd: Soft, nontender,  no hepatomegaly  Ext: No edema, pulses 2+ Musculoskeletal: No deformities, BUE and BLE strength normal and equal Skin: Warm and dry, no rashes   Neuro: Alert and oriented to person, place, time, and situation, CNII-XII grossly intact, no focal deficits  Psych: Normal mood and affect   ASSESSMENT:   DWIGHT BURDO is a 87 y.o. female who presents for the following: 1. Paroxysmal atrial fibrillation (HCC)   2. Syncope and collapse  PLAN:   1. Paroxysmal atrial fibrillation (HCC) -History of A. fib 20 years ago.  No further recurrence from what I can tell.  I did review the strips from EMS.  There was no A. fib.  She did have some ectopy but no A. Fib. -EKG does show PACs.  No symptoms from this.  No need to treat this. -She had A. fib 20 years ago no further recurrence.  I see no need for anticoagulation.  It appears this is in the setting of an acute illness. -I have recommended an echocardiogram just to make sure her heart is structurally normal.  There is no alarming findings on her EKG today.  We can just make sure her heart structurally normal.  2. Syncope and collapse -Likely a vasovagal event versus orthostasis.  No red flag symptoms such as chest pain, shortness of breath or palpitations.  I did review the strips from EMS.  She did not have atrial fibrillation.  Her EKG was interpreted incorrectly.  EKG in office demonstrates sinus rhythm with a single PAC.  She has no symptoms of palpitations or chest pain.  She can exercise up to 3 miles without any limitations.  I recommend she stay well-hydrated.  She apparently had a similar episode roughly 2 years ago.  She should also keep an eye on her blood pressure.  BP 162/82.  Given her age I think any values between 140-150 should be okay.  They will keep a close eye on this.  She also needs to consume more water.  Not drinking nearly enough.  We will obtain an echocardiogram given her history of atrial fibrillation which she has no  murmurs or concerning symptoms or findings on examination today.  Disposition: Return if symptoms worsen or fail to improve.  Medication Adjustments/Labs and Tests Ordered: Current medicines are reviewed at length with the patient today.  Concerns regarding medicines are outlined above.  Orders Placed This Encounter  Procedures  . EKG 12-Lead  . ECHOCARDIOGRAM COMPLETE   No orders of the defined types were placed in this encounter.   Patient Instructions  Medication Instructions:  The current medical regimen is effective;  continue present plan and medications.  *If you need a refill on your cardiac medications before your next appointment, please call your pharmacy*   Testing/Procedures: Echocardiogram - Your physician has requested that you have an echocardiogram. Echocardiography is a painless test that uses sound waves to create images of your heart. It provides your doctor with information about the size and shape of your heart and how well your heart's chambers and valves are working. This procedure takes approximately one hour. There are no restrictions for this procedure. This will be performed at our [facility] location - [address].    Follow-Up: At [facility], you and your health needs are our priority.  As part of our continuing mission to provide you with exceptional heart care, we have created designated Provider Care Teams.  These Care Teams include your primary Cardiologist (physician) and Advanced Practice Providers (APPs -  Physician Assistants and Nurse Practitioners) who all work together to provide you with the care you need, when you need it.  We recommend signing up for the patient portal called "MyChart".  Sign up information is provided on this After Visit Summary.  MyChart is used to connect with patients for Virtual Visits (Telemedicine).  Patients are able to view lab/test results, encounter notes, upcoming appointments, etc.  Non-urgent  messages can be sent to your provider as well.   To learn more about what you can do with MyChart, go to [email or web address].    Your next appointment:   As needed  The format for your next appointment:   In Person  Provider:   Lennie Odor, MD        Signed, Lenna Gilford. Flora Lipps, MD [facility]  [address], [address] [place], [place] [address] [phone]  01/28/2020 11:33 AM

## 2020-01-28 ENCOUNTER — Ambulatory Visit: Payer: Medicare HMO | Admitting: Cardiovascular Disease

## 2020-01-28 ENCOUNTER — Encounter: Payer: Self-pay | Admitting: Cardiovascular Disease

## 2020-01-28 ENCOUNTER — Other Ambulatory Visit: Payer: Self-pay

## 2020-01-28 VITALS — BP 162/82 | HR 75 | Ht 62.0 in | Wt 119.4 lb

## 2020-01-28 DIAGNOSIS — I480 Paroxysmal atrial fibrillation: Secondary | ICD-10-CM

## 2020-01-28 DIAGNOSIS — R55 Syncope and collapse: Secondary | ICD-10-CM

## 2020-01-28 NOTE — Patient Instructions (Signed)
Medication Instructions:  The current medical regimen is effective;  continue present plan and medications.  *If you need a refill on your cardiac medications before your next appointment, please call your pharmacy*   Testing/Procedures: Echocardiogram - Your physician has requested that you have an echocardiogram. Echocardiography is a painless test that uses sound waves to create images of your heart. It provides your doctor with information about the size and shape of your heart and how well your heart's chambers and valves are working. This procedure takes approximately one hour. There are no restrictions for this procedure. This will be performed at our [address] location - [address], [address].    Follow-Up: At [facility], you and your health needs are our priority.  As part of our continuing mission to provide you with exceptional heart care, we have created designated Provider Care Teams.  These Care Teams include your primary Cardiologist (physician) and Advanced Practice Providers (APPs -  Physician Assistants and Nurse Practitioners) who all work together to provide you with the care you need, when you need it.  We recommend signing up for the patient portal called "MyChart".  Sign up information is provided on this After Visit Summary.  MyChart is used to connect with patients for Virtual Visits (Telemedicine).  Patients are able to view lab/test results, encounter notes, upcoming appointments, etc.  Non-urgent messages can be sent to your provider as well.   To learn more about what you can do with MyChart, go to [email or web address].    Your next appointment:   As needed  The format for your next appointment:   In Person  Provider:   Gifford O'Neal, MD      

## 2020-02-29 ENCOUNTER — Other Ambulatory Visit: Payer: Self-pay

## 2020-02-29 ENCOUNTER — Ambulatory Visit (HOSPITAL_COMMUNITY): Payer: Medicare HMO | Attending: Cardiovascular Disease

## 2020-02-29 DIAGNOSIS — I480 Paroxysmal atrial fibrillation: Secondary | ICD-10-CM

## 2020-02-29 LAB — ECHOCARDIOGRAM COMPLETE
Area-P 1/2: 3.77 cm2
S' Lateral: 2.6 cm

## 2020-04-09 NOTE — Progress Notes (Signed)
Cardiology Office Note:   Date:  04/12/2020  NAME:  Kathleen Santana    MRN: [id number] DOB:  May 15, 1926   PCP:  Deloris Ping, MD  Cardiologist:  No primary care provider on file.   Referring MD: Deloris Ping, *   Chief Complaint  Patient presents with  . Atrial Fibrillation   History of Present Illness:   Kathleen Santana is a 87 y.o. female with a hx of paroxysmal atrial fibrillation, hypertension, hyperlipidemia who presents for follow-up.  Seen in the emergency room at [facility] on 04/06/2020.  Had presyncope while at the gym.  Found to be in A. fib with RVR.  She was admitted overnight.  She converted back to normal rhythm.  She was discharged on metoprolol.  Anticoagulation was recommended but she deferred till she discussed with me.  She reports she was at the gym.  She felt dizzy and lightheaded.  She did not feel her heart racing.  She reports she got on the ground.  She reports she was in and out of consciousness.  She reports she felt like she did not blackout but she was told she did.  She apparently was admitted with A. fib.  She was treated with AV nodal agents and converted back to sinus rhythm.  Anticoagulation was deferred.  She did have an echocardiogram as a part of our initial work-up.  That report incorrectly labeled her as having atrial fibrillation.  I did review that echo and she had sinus rhythm with PACs.  Her EKG in office demonstrates normal sinus rhythm heart rate 71 with PACs.  Her blood pressure is also elevated 166/80.  Her lisinopril was stopped in the hospital.  She needs a better blood pressure regimen as well.  We discussed that anticoagulation is indicated.  We also discussed that she likely should pursue a rhythm control strategy.  She has had 2 alarming episodes.  Although her first episode of dizziness lightheadedness was not documented to have atrial fibrillation she was reported to have this through the [place] system.  We also  discussed that anticoagulation is indicated.  She is okay to start Eliquis.  Given her age and body weight less than 60 kg she will qualify for 2.5 mg twice a day.  She is still walking up to 4 miles per day.  No chest pain or shortness of breath reported.  Echocardiogram showed normal LV function.  TSH 1.27  Problem List 1. Paroxysmal Afib -Afib with RVR 04/06/2020 -CHADSVASC=4 (age, sex, HTN) 2. HTN 3. HLD 4. Hypothyroidism   Past Medical History: Past Medical History:  Diagnosis Date  . Afib (HCC)   . Arrhythmia   . GERD (gastroesophageal reflux disease)   . Hypertension   . Hypothyroidism   . Lower extremity neuropathy   . Osteoporosis   . Rhinitis, non-allergic     Past Surgical History: Past Surgical History:  Procedure Laterality Date  . BACK SURGERY    . NOSE SURGERY      Current Medications: Current Meds  Medication Sig  . amLODipine (NORVASC) 10 MG tablet Take 1 tablet (10 mg total) by mouth daily.  Marland Kitchen apixaban (ELIQUIS) 2.5 MG TABS tablet Take 1 tablet (2.5 mg total) by mouth 2 (two) times daily.  Marland Kitchen aspirin 81 MG chewable tablet Chew by mouth.  . Cholecalciferol (VITAMIN D-3 PO) Take 1 capsule by mouth daily with breakfast.  . dronedarone (MULTAQ) 400 MG tablet Take 1 tablet (400 mg total) by mouth  2 (two) times daily with a meal.  . gabapentin (NEURONTIN) 300 MG capsule Take 300 mg by mouth in the morning, at noon, in the evening, and at bedtime.  Marland Kitchen ipratropium (ATROVENT) 0.03 % nasal spray Place 1-3 sprays into both nostrils in the morning and at bedtime.  Marland Kitchen levothyroxine (SYNTHROID) 50 MCG tablet Take 50 mcg by mouth daily before breakfast.  . Multiple Vitamins-Minerals (ONE-A-DAY WOMENS 50+ ADVANTAGE) TABS Take 1 tablet by mouth daily with breakfast.  . omeprazole (PRILOSEC) 20 MG capsule Take 20 mg by mouth daily as needed (before eating spicy/hot foods).   . [DISCONTINUED] lisinopril (ZESTRIL) 2.5 MG tablet Take 2.5 mg by mouth at bedtime.  .  [DISCONTINUED] metoprolol succinate (TOPROL-XL) 25 MG 24 hr tablet Take by mouth.     Allergies:    Other   Social History: Social History   Socioeconomic History  . Marital status: Widowed    Spouse name: Not on file  . Number of children: 2  . Years of education: Not on file  . Highest education level: Not on file  Occupational History  . Occupation: retired  Tobacco Use  . Smoking status: Former Smoker    Years: 16.00    Types: Cigarettes  . Smokeless tobacco: Never Used  Substance and Sexual Activity  . Alcohol use: Never  . Drug use: Never  . Sexual activity: Not on file  Other Topics Concern  . Not on file  Social History Narrative  . Not on file   Social Determinants of Health   Financial Resource Strain: Not on file  Food Insecurity: Not on file  Transportation Needs: Not on file  Physical Activity: Not on file  Stress: Not on file  Social Connections: Not on file     Family History: The patient's family history includes Arrhythmia in her mother; Heart disease in her father.  ROS:   All other ROS reviewed and negative. Pertinent positives noted in the HPI.     EKGs/Labs/Other Studies Reviewed:   The following studies were personally reviewed by me today:  EKG:  EKG is ordered today.  The ekg ordered today demonstrates sinus rhythm heart rate 71, PACs noted, and was personally reviewed by me.   TTE 02/29/2020 1. Left ventricular ejection fraction, by estimation, is 55 to 60%. The  left ventricle has normal function. The left ventricle has no regional  wall motion abnormalities. There is moderate asymmetric left ventricular  hypertrophy. Left ventricular  diastolic function could not be evaluated.  2. Right ventricular systolic function is normal. The right ventricular  size is normal.  3. The mitral valve is normal in structure. Mild to moderate mitral valve  regurgitation.  4. The aortic valve is normal in structure. Aortic valve regurgitation  is  not visualized. No aortic stenosis is present.   CXR 04/06/2020  FINDINGS: Stable mediastinal and hilar contours. Mild retrocardiac atelectasis. No evidence of acute infiltrate, pleural fluid, or pneumothorax. Stable scoliosis. No acute osseous abnormality.    Recent Labs: 01/24/2020: BUN 20; Creatinine, Ser 1.18; Hemoglobin 12.7; Platelets 235; Potassium 4.2; Sodium 134   Recent Lipid Panel No results found for: CHOL, TRIG, HDL, CHOLHDL, VLDL, LDLCALC, LDLDIRECT  Physical Exam:   VS:  BP (!) 166/80   Pulse 71   Ht  (1.575 m)   Wt 121 lb 3.2 oz (55 kg)   SpO2 99%   BMI 22.17 kg/m    Wt Readings from Last 3 Encounters:  04/12/20 121 lb 3.2  oz (55 kg)  01/28/20 119 lb 6.4 oz (54.2 kg)  01/24/20 119 lb (54 kg)    General: Well nourished, well developed, in no acute distress Head: Atraumatic, normal size  Eyes: PEERLA, EOMI  Neck: Supple, no JVD Endocrine: No thryomegaly Cardiac: Normal S1, S2; RRR; no murmurs, rubs, or gallops Lungs: Clear to auscultation bilaterally, no wheezing, rhonchi or rales  Abd: Soft, nontender, no hepatomegaly  Ext: No edema, pulses 2+ Musculoskeletal: No deformities, BUE and BLE strength normal and equal Skin: Warm and dry, no rashes   Neuro: Alert and oriented to person, place, time, and situation, CNII-XII grossly intact, no focal deficits  Psych: Normal mood and affect   ASSESSMENT:   Kathleen Santana is a 87 y.o. female who presents for the following: 1. Paroxysmal atrial fibrillation (HCC)   2. Syncope and collapse     PLAN:   1. Paroxysmal atrial fibrillation (HCC) 2. Syncope and collapse -Found to have A. fib with syncope.  Unclear if she actually did pass out.  She reports she remembers most of it.  She was found to be in rapid A. fib.  She converted back to rhythm and was sent out to discharge with [place]. -She is back in sinus rhythm today.  No further recurrence.  She had no palpitations.  She has now had 2 episodes of  passing out.  The first episode did not have clear A. fib documented.  This episode does.  Given the severity of her symptoms I recommended a rhythm control strategy.  Given her age class Ic agents and/or Tikosyn would not be the best option for her.  I think a trial of Dronedarone 400 mg BID would be a decent strategy.  She has no evidence of CAD.  There is no evidence of congestive heart failure.  The other option would be amiodarone.  Given her age I do not think she would want to be admitted for Tikosyn loading.  Nor would I recommend this. -Echocardiogram shows normal LV function.  Normal chamber dimensions.  It was incorrectly mentioned is her having A. fib during the study.  She has sinus rhythm with PACs. -CHADSVASC=4.  Eliquis 2.5 mg based on body weight less than 60 kg and age over 17. -I would also like to proceed with a 2-week ZIO.  We can get an idea if she has any breakthrough on dronedarone.  Hopefully she will have no further recurrence.  I recommended regular exercise and adequate hydration. -Her blood pressure is also elevated.  We will stop her metoprolol.  I would like to put her on Norvasc 10 mg daily. -She will see me in close follow-up in 6 weeks.   Disposition: Return in about 6 weeks (around 05/24/2020).  Medication Adjustments/Labs and Tests Ordered: Current medicines are reviewed at length with the patient today.  Concerns regarding medicines are outlined above.  Orders Placed This Encounter  Procedures  . LONG TERM MONITOR (3-14 DAYS)  . EKG 12-Lead   Meds ordered this encounter  Medications  . dronedarone (MULTAQ) 400 MG tablet    Sig: Take 1 tablet (400 mg total) by mouth 2 (two) times daily with a meal.    Dispense:  60 tablet    Refill:  3  . apixaban (ELIQUIS) 2.5 MG TABS tablet    Sig: Take 1 tablet (2.5 mg total) by mouth 2 (two) times daily.    Dispense:  60 tablet    Refill:  3  . amLODipine (NORVASC)  10 MG tablet    Sig: Take 1 tablet (10 mg total) by  mouth daily.    Dispense:  180 tablet    Refill:  3    Patient Instructions  Medication Instructions:  Start Eliquis 2.5 mg twice daily  Start Multaq 400 mg twice daily  Start Norvasc 10 mg daily   Stop Metoprolol  *If you need a refill on your cardiac medications before your next appointment, please call your pharmacy*  Testing/Procedures: Christena DeemZIO XT- Long Term Monitor Instructions   Your physician has requested you wear your ZIO patch monitor___14____days.   This is a single patch monitor.  Irhythm supplies one patch monitor per enrollment.  Additional stickers are not available.   Please do not apply patch if you will be having a Nuclear Stress Test, Echocardiogram, Cardiac CT, MRI, or Chest Xray during the time frame you would be wearing the monitor. The patch cannot be worn during these tests.  You cannot remove and re-apply the ZIO XT patch monitor.   Your ZIO patch monitor will be sent USPS Priority mail from [facility] directly to your home address. The monitor may also be mailed to a PO BOX if home delivery is not available.   It may take 3-5 days to receive your monitor after you have been enrolled.   Once you have received you monitor, please review enclosed instructions.  Your monitor has already been registered assigning a specific monitor serial # to you.   Applying the monitor   Shave hair from upper left chest.   Hold abrader disc by orange tab.  Rub abrader in 40 strokes over left upper chest as indicated in your monitor instructions.   Clean area with 4 enclosed alcohol pads .  Use all pads to assure are is cleaned thoroughly.  Let dry.   Apply patch as indicated in monitor instructions.  Patch will be place under collarbone on left side of chest with arrow pointing upward.   Rub patch adhesive wings for 2 minutes.Remove white label marked "1".  Remove white label marked "2".  Rub patch adhesive wings for 2 additional minutes.   While looking in a  mirror, press and release button in center of patch.  A small green light will flash 3-4 times .  This will be your only indicator the monitor has been turned on.     Do not shower for the first 24 hours.  You may shower after the first 24 hours.   Press button if you feel a symptom. You will hear a small click.  Record Date, Time and Symptom in the Patient Log Book.   When you are ready to remove patch, follow instructions on last 2 pages of Patient Log Book.  Stick patch monitor onto last page of Patient Log Book.   Place Patient Log Book in [place] box.  Use locking tab on box and tape box closed securely.  The Orange and [employer] box has [employer] Cop[employer]aid postage on it.  Please place in mailbox as soon as possible.  Your physician should have your test results approximately 7 days after the monitor has been mailed back to [facility].   Call [facility] Customer Care at [phone] if you have questions regarding your ZIO XT patch monitor.  Call them immediately if you see an orange light blinking on your monitor.   If your monitor falls off in less than 4 days contact our Monitor department at [phone].  If your monitor becomes loose or  falls off after 4 days call [employer] at [phone] for suggestions on securing your monitor.     Follow-Up: At [facility], you and your health needs are our priority.  As part of our continuing mission to provide you with exceptional heart care, we have created designated Provider Care Teams.  These Care Teams include your primary Cardiologist (physician) and Advanced Practice Providers (APPs -  Physician Assistants and Nurse Practitioners) who all work together to provide you with the care you need, when you need it.  We recommend signing up for the patient portal called "MyChart".  Sign up information is provided on this After Visit Summary.  MyChart is used to connect with patients for Virtual Visits (Telemedicine).  Patients are able to view lab/test  results, encounter notes, upcoming appointments, etc.  Non-urgent messages can be sent to your provider as well.   To learn more about what you can do with MyChart, go to [email or web address].    Your next appointment:   6 week(s)  The format for your next appointment:   In Person  Provider:   Lennie Odor, MD        Time Spent with Patient: I have spent a total of 35 minutes with patient reviewing hospital notes, telemetry, EKGs, labs and examining the patient as well as establishing an assessment and plan that was discussed with the patient.  > 50% of time was spent in direct patient care.  Signed, Lenna Gilford. Flora Lipps, MD, [facility]  [facility]  [address], [address] [place], [place] [address] [phone]  04/12/2020 1:32 PM

## 2020-04-12 ENCOUNTER — Other Ambulatory Visit: Payer: Self-pay

## 2020-04-12 ENCOUNTER — Ambulatory Visit (INDEPENDENT_AMBULATORY_CARE_PROVIDER_SITE_OTHER): Payer: Medicare HMO

## 2020-04-12 ENCOUNTER — Ambulatory Visit: Payer: Medicare HMO | Admitting: Cardiovascular Disease

## 2020-04-12 ENCOUNTER — Encounter: Payer: Self-pay | Admitting: *Deleted

## 2020-04-12 ENCOUNTER — Encounter: Payer: Self-pay | Admitting: Cardiovascular Disease

## 2020-04-12 VITALS — BP 166/80 | HR 71 | Ht 62.0 in | Wt 121.2 lb

## 2020-04-12 DIAGNOSIS — I480 Paroxysmal atrial fibrillation: Secondary | ICD-10-CM

## 2020-04-12 DIAGNOSIS — R55 Syncope and collapse: Secondary | ICD-10-CM

## 2020-04-12 MED ORDER — APIXABAN 2.5 MG PO TABS: 2.5000 mg | ORAL_TABLET | Freq: Two times a day (BID) | ORAL | 3 refills | Status: DC

## 2020-04-12 MED ORDER — AMLODIPINE BESYLATE 10 MG PO TABS: 10.0000 mg | ORAL_TABLET | Freq: Every day | ORAL | 3 refills | Status: DC

## 2020-04-12 MED ORDER — DRONEDARONE HCL 400 MG PO TABS: 400.0000 mg | ORAL_TABLET | Freq: Two times a day (BID) | ORAL | 3 refills | Status: DC

## 2020-04-12 NOTE — Patient Instructions (Signed)
Medication Instructions:  Start Eliquis 2.5 mg twice daily  Start Multaq 400 mg twice daily  Start Norvasc 10 mg daily   Stop Metoprolol  *If you need a refill on your cardiac medications before your next appointment, please call your pharmacy*  Testing/Procedures: Christena Deem- Long Term Monitor Instructions   Your physician has requested you wear your ZIO patch monitor___14____days.   This is a single patch monitor.  Irhythm supplies one patch monitor per enrollment.  Additional stickers are not available.   Please do not apply patch if you will be having a Nuclear Stress Test, Echocardiogram, Cardiac CT, MRI, or Chest Xray during the time frame you would be wearing the monitor. The patch cannot be worn during these tests.  You cannot remove and re-apply the ZIO XT patch monitor.   Your ZIO patch monitor will be sent USPS Priority mail from [facility] directly to your home address. The monitor may also be mailed to a PO BOX if home delivery is not available.   It may take 3-5 days to receive your monitor after you have been enrolled.   Once you have received you monitor, please review enclosed instructions.  Your monitor has already been registered assigning a specific monitor serial # to you.   Applying the monitor   Shave hair from upper left chest.   Hold abrader disc by orange tab.  Rub abrader in 40 strokes over left upper chest as indicated in your monitor instructions.   Clean area with 4 enclosed alcohol pads .  Use all pads to assure are is cleaned thoroughly.  Let dry.   Apply patch as indicated in monitor instructions.  Patch will be place under collarbone on left side of chest with arrow pointing upward.   Rub patch adhesive wings for 2 minutes.Remove white label marked "1".  Remove white label marked "2".  Rub patch adhesive wings for 2 additional minutes.   While looking in a mirror, press and release button in center of patch.  A small green light will flash 3-4  times .  This will be your only indicator the monitor has been turned on.     Do not shower for the first 24 hours.  You may shower after the first 24 hours.   Press button if you feel a symptom. You will hear a small click.  Record Date, Time and Symptom in the Patient Log Book.   When you are ready to remove patch, follow instructions on last 2 pages of Patient Log Book.  Stick patch monitor onto last page of Patient Log Book.   Place Patient Log Book in [place] box.  Use locking tab on box and tape box closed securely.  The Orange and [employer] has [employer] on it.  Please place in mailbox as soon as possible.  Your physician should have your test results approximately 7 days after the monitor has been mailed back to [facility].   Call [facility] Customer Care at [phone] if you have questions regarding your ZIO XT patch monitor.  Call them immediately if you see an orange light blinking on your monitor.   If your monitor falls off in less than 4 days contact our Monitor department at [phone].  If your monitor becomes loose or falls off after 4 days call [employer] at [phone] for suggestions on securing your monitor.     Follow-Up: At [facility], you and your health needs are our priority.  As part of our  continuing mission to provide you with exceptional heart care, we have created designated Provider Care Teams.  These Care Teams include your primary Cardiologist (physician) and Advanced Practice Providers (APPs -  Physician Assistants and Nurse Practitioners) who all work together to provide you with the care you need, when you need it.  We recommend signing up for the patient portal called "MyChart".  Sign up information is provided on this After Visit Summary.  MyChart is used to connect with patients for Virtual Visits (Telemedicine).  Patients are able to view lab/test results, encounter notes, upcoming appointments, etc.  Non-urgent messages can be sent to  your provider as well.   To learn more about what you can do with MyChart, go to [email or web address].    Your next appointment:   6 week(s)  The format for your next appointment:   In Person  Provider:   Lennie Odor, MD

## 2020-04-12 NOTE — Progress Notes (Signed)
Patient ID: Kathleen Santana, female   DOB: [birth year], 87 y.o.   MRN: [id number] Patient enrolled for 14 day ZIO XT long term monitor to be shipped to her home.

## 2020-04-14 DIAGNOSIS — I480 Paroxysmal atrial fibrillation: Secondary | ICD-10-CM | POA: Diagnosis not present

## 2020-04-17 MED ORDER — AMLODIPINE BESYLATE 5 MG PO TABS: 5.0000 mg | ORAL_TABLET | Freq: Every day | ORAL | 1 refills | Status: DC

## 2020-04-25 MED ORDER — LISINOPRIL 10 MG PO TABS: 10.0000 mg | ORAL_TABLET | Freq: Every day | ORAL | 3 refills | Status: DC

## 2020-04-25 NOTE — Telephone Encounter (Signed)
Spoke to pt on the phone regarding swelling in her legs. Pt states that today the legs look a lot better compared to the last couple of days. Pt states that yesterday the left leg was worst than the right leg and the calf was feverish and warm to the touch. Pt states that she has not changed anything, but the swelling is better and is about the same in both legs now. Pt asked if she is taking medications as directed and she states that she is. Pt takes her blood pressure daily and her most recent b/p was 131/75.   Pt is on aspirin and eliquis.   Please advise.

## 2020-04-25 NOTE — Telephone Encounter (Signed)
Patient returning call.

## 2020-04-25 NOTE — Telephone Encounter (Signed)
Left message for pt to call back  °

## 2020-04-25 NOTE — Addendum Note (Signed)
Addended by: Bernita Buffy on: 04/25/2020 03:58 PM   Modules accepted: Orders

## 2020-04-25 NOTE — Telephone Encounter (Signed)
Spoke with pt regarding Dr. Marylene Buerger recommendations. Per Dr. Flora Lipps: I do not think she has a blood clot.  She is on Eliquis.  Would be very unusual to have a blood clot.  This swelling could be related to amlodipine.  Lets have her stop this.  She should start 10 mg of lisinopril.  I think I would recommend the medication change before we pursue a ultrasound of her legs.  Lets make this change and see what happens.  Please let her know to call us back and let us know if things are improved off the amlodipine.  Pt will stop taking amlodipine and start taking lisinopril 10mg  once daily.  Pt does mention that she was on lisinopril before amlodipine was started but is willing to try this again. Prescription sent to pharmacy of choice. Pt verbalizes understanding. Pt will follow up with Dr. at her next office visit on 05/18/20.

## 2020-04-25 NOTE — Telephone Encounter (Signed)
I do not think she has a blood clot.  She is on Eliquis.  Would be very unusual to have a blood clot.  This swelling could be related to amlodipine.  Lets have her stop this.  She should start 10 mg of lisinopril.  I think I would recommend the medication change before we pursue a ultrasound of her legs.  Lets make this change and see what happens.  Please let her know to call us back and let us know if things are improved off the amlodipine.  Gerri Spore T. Flora Lipps, MD, [facility]  [facility]  [address], [address] [place], [place] [address] [phone]  3:17 PM

## 2020-05-09 MED ORDER — AMIODARONE HCL 200 MG PO TABS: 200.0000 mg | ORAL_TABLET | Freq: Every day | ORAL | 3 refills | Status: DC

## 2020-05-18 ENCOUNTER — Other Ambulatory Visit: Payer: Self-pay

## 2020-05-18 ENCOUNTER — Encounter: Payer: Self-pay | Admitting: Cardiovascular Disease

## 2020-05-18 ENCOUNTER — Ambulatory Visit: Payer: Medicare HMO | Admitting: Cardiovascular Disease

## 2020-05-18 VITALS — BP 160/78 | HR 77 | Ht 62.0 in | Wt 120.2 lb

## 2020-05-18 DIAGNOSIS — I10 Essential (primary) hypertension: Secondary | ICD-10-CM | POA: Diagnosis not present

## 2020-05-18 DIAGNOSIS — I480 Paroxysmal atrial fibrillation: Secondary | ICD-10-CM | POA: Diagnosis not present

## 2020-05-18 DIAGNOSIS — Z7901 Long term (current) use of anticoagulants: Secondary | ICD-10-CM

## 2020-05-18 DIAGNOSIS — I471 Supraventricular tachycardia: Secondary | ICD-10-CM

## 2020-05-18 NOTE — Progress Notes (Signed)
Cardiology Office Note:   Date:  05/18/2020  NAME:  Kathleen FallsHelen E Jaspers    MRN: [id number] DOB:  09/18/1926   PCP:  Deloris Pingyter-Brown, Sherry M, MD  Cardiologist:  None  Electrophysiologist:  None   Referring MD: Deloris Pingyter-Brown, Sherry M, *   Chief Complaint  Patient presents with  . Follow-up   History of Present Illness:   Kathleen Santana is a 87 y.o. female with a hx of pAF, A tach, HTN who presents for follow-up. Started on dronedarone and eliquis at last visit. Dronedarone too expensive. Placed on amiodarone. Put on norvasc 10 mg at last visit as well, but had LE edema. We switched to lisinopril.  She reports her leg swelling improved.  This was related to Norvasc.  BP 160/78.  She presents with a log.  Her blood pressure is well controlled at home.  Systolic values 130 to 140 mmHg.  No major bleeding on Eliquis.  We discussed watchman.  She is not a candidate currently.  She is also 87 years of age.  We have her on amiodarone 200 mg daily.  Most recent TSH 5.5.  Synthroid dose has been adjusted.  Chest x-ray through the [facility] system on 04/06/2020 was normal.  She reports that she is felt good for the past 1 week.  She is still walking several miles per day.  No chest pain or shortness of breath.  She has had a few episodes of dizziness and foggy headedness but these have been short-lived.  1 episode occurred after walking on a hot day.  I suspect dehydration is a big issue here.  She is only drinking 40 to 45 ounces of water.  I would like for her to increase this.  She stopped taking aspirin but I do agree with this.  She denies any rapid heartbeat sensation.  Pulses regular on examination.  She appears to be stable on amiodarone.  We discussed several rhythm medication options for her.  Unfortunately Multaq was too expensive.  I do not think Tikosyn is a good idea for her.  Given her age she has not wanted to be admitted to the hospital.  Cardiac ablation likely not an option for her as well.  We  discussed to continue with medical management for now and see how she does.   Problem List 1. Paroxysmal Afib -Afib with RVR 04/06/2020 -4% Afib burden -CHADSVASC=4 (age, sex, HTN) 2. Atrial tachycardia/PACs -6.3% PAC burden  3. HTN 4. HLD 5. Hypothyroidism   Past Medical History: Past Medical History:  Diagnosis Date  . Afib (HCC)   . Arrhythmia   . GERD (gastroesophageal reflux disease)   . Hypertension   . Hypothyroidism   . Lower extremity neuropathy   . Osteoporosis   . Rhinitis, non-allergic     Past Surgical History: Past Surgical History:  Procedure Laterality Date  . BACK SURGERY    . NOSE SURGERY      Current Medications: Current Meds  Medication Sig  . amiodarone (PACERONE) 200 MG tablet Take 1 tablet (200 mg total) by mouth daily.  Marland Kitchen. apixaban (ELIQUIS) 2.5 MG TABS tablet Take 1 tablet (2.5 mg total) by mouth 2 (two) times daily.  . Cholecalciferol (VITAMIN D-3 PO) Take 1 capsule by mouth daily with breakfast.  . gabapentin (NEURONTIN) 300 MG capsule Take 300 mg by mouth in the morning, at noon, in the evening, and at bedtime.  Marland Kitchen. ipratropium (ATROVENT) 0.03 % nasal spray Place 1-3 sprays into both nostrils in  the morning and at bedtime.  Marland Kitchen levothyroxine (SYNTHROID) 50 MCG tablet Take 50 mcg by mouth daily before breakfast.  . lisinopril (ZESTRIL) 10 MG tablet Take 1 tablet (10 mg total) by mouth daily.  . Multiple Vitamins-Minerals (ONE-A-DAY WOMENS 50+ ADVANTAGE) TABS Take 1 tablet by mouth daily with breakfast.  . omeprazole (PRILOSEC) 20 MG capsule Take 20 mg by mouth daily as needed (before eating spicy/hot foods).      Allergies:    Other   Social History: Social History   Socioeconomic History  . Marital status: Widowed    Spouse name: Not on file  . Number of children: 2  . Years of education: Not on file  . Highest education level: Not on file  Occupational History  . Occupation: retired  Tobacco Use  . Smoking status: Former Smoker     Years: 16.00    Types: Cigarettes  . Smokeless tobacco: Never Used  Substance and Sexual Activity  . Alcohol use: Never  . Drug use: Never  . Sexual activity: Not on file  Other Topics Concern  . Not on file  Social History Narrative  . Not on file   Social Determinants of Health   Financial Resource Strain: Not on file  Food Insecurity: Not on file  Transportation Needs: Not on file  Physical Activity: Not on file  Stress: Not on file  Social Connections: Not on file     Family History: The patient's family history includes Arrhythmia in her mother; Heart disease in her father.  ROS:   All other ROS reviewed and negative. Pertinent positives noted in the HPI.     EKGs/Labs/Other Studies Reviewed:   The following studies were personally reviewed by me today:  TTE 02/29/2020 1. Left ventricular ejection fraction, by estimation, is 55 to 60%. The  left ventricle has normal function. The left ventricle has no regional  wall motion abnormalities. There is moderate asymmetric left ventricular  hypertrophy. Left ventricular  diastolic function could not be evaluated.  2. Right ventricular systolic function is normal. The right ventricular  size is normal.  3. The mitral valve is normal in structure. Mild to moderate mitral valve  regurgitation.  4. The aortic valve is normal in structure. Aortic valve regurgitation is  not visualized. No aortic stenosis is present.   Zio 05/14/2020 Impression: 1. Paroxysmal Atrial fibrillation detected (4% burden; longest duration 13 hours 37 minutes; average HR 94 bpm).  2. Atrial tachycardia episodes detected (103 episodes in 12 days; longest duration 19 beats).  3. Frequent PACs (6.3%).    Recent Labs: 01/24/2020: BUN 20; Creatinine, Ser 1.18; Hemoglobin 12.7; Platelets 235; Potassium 4.2; Sodium 134   Recent Lipid Panel No results found for: CHOL, TRIG, HDL, CHOLHDL, VLDL, LDLCALC, LDLDIRECT  Physical Exam:   VS:  BP (!) 160/78    Pulse 77   Ht 5\' 2"  (1.575 m)   Wt 120 lb 3.2 oz (54.5 kg)   SpO2 99%   BMI 21.98 kg/m    Wt Readings from Last 3 Encounters:  05/18/20 120 lb 3.2 oz (54.5 kg)  04/12/20 121 lb 3.2 oz (55 kg)  01/28/20 119 lb 6.4 oz (54.2 kg)    General: Well nourished, well developed, in no acute distress Head: Atraumatic, normal size  Eyes: PEERLA, EOMI  Neck: Supple, no JVD Endocrine: No thryomegaly Cardiac: Normal S1, S2; RRR; no murmurs, rubs, or gallops Lungs: Clear to auscultation bilaterally, no wheezing, rhonchi or rales  Abd: Soft, nontender, no  hepatomegaly  Ext: No edema, pulses 2+ Musculoskeletal: No deformities, BUE and BLE strength normal and equal Skin: Warm and dry, no rashes   Neuro: Alert and oriented to person, place, time, and situation, CNII-XII grossly intact, no focal deficits  Psych: Normal mood and affect   ASSESSMENT:   PEDRO OLDENBURG is a 87 y.o. female who presents for the following: 1. Paroxysmal atrial fibrillation (HCC)   2. Adequate anticoagulation on anticoagulant therapy   3. Atrial tachycardia (HCC)   4. Primary hypertension     PLAN:   1. Paroxysmal atrial fibrillation (HCC) 2. Adequate anticoagulation on anticoagulant therapy 3. Atrial tachycardia (HCC) -She has had 2 recent syncopal episodes.  Found to be in rapid atrial fibrillation during the second episode.  Echocardiogram demonstrates normal LV function.  Recent heart monitor demonstrates an A. fib burden of 4%.  She was admitted to [facility] in March 2022 and had normal and negative cardiac enzymes with rapid A. fib.  She can walk several miles without any limitations such as chest pain or shortness of breath.  I feel she is unlikely to have obstructive CAD to explain this. -I suspect her symptoms of dizziness have been related to dehydration as well as rapid A. fib.  We have opted for rhythm control strategy.  Unfortunately dronedarone was too expensive.  Given her advanced age I do not think  Tikosyn is a good idea.  Sotalol also would require admission to the hospital.  We also discussed cardiac ablation which likely would not be beneficial at her age.  I think amiodarone is acceptable.  We will continue to 100 mg daily for now.  Recent TSH was normal.  Recent chest x-ray in March 2022 was normal.  I would like for her to continue the current dose of 200 mg daily.  We likely will drop her to 100 mg daily as long as she is stable.  She is had no significant bradycardia. -Other options moving forward to control her A. fib would be AV node ablation with pacemaker.  We discussed in the office that this would be our last option.  She also would have to agree to this.  Right now she is okay to continue with medical management to see how it goes. -She will continue Eliquis for stroke prophylaxis.  No major bleeding.  She is not a candidate for watchman.  4. Primary hypertension -BP has been stable.  She will continue to check it at home.  She did not tolerate Norvasc.  She is on lisinopril 20 mg daily.  Disposition: Return in about 3 months (around 08/18/2020).  Medication Adjustments/Labs and Tests Ordered: Current medicines are reviewed at length with the patient today.  Concerns regarding medicines are outlined above.  No orders of the defined types were placed in this encounter.  No orders of the defined types were placed in this encounter.   Patient Instructions  Medication Instructions:  The current medical regimen is effective;  continue present plan and medications.  *If you need a refill on your cardiac medications before your next appointment, please call your pharmacy*   Follow-Up: At [facility], you and your health needs are our priority.  As part of our continuing mission to provide you with exceptional heart care, we have created designated Provider Care Teams.  These Care Teams include your primary Cardiologist (physician) and Advanced Practice Providers (APPs -   Physician Assistants and Nurse Practitioners) who all work together to provide you with the care  you need, when you need it.  We recommend signing up for the patient portal called "MyChart".  Sign up information is provided on this After Visit Summary.  MyChart is used to connect with patients for Virtual Visits (Telemedicine).  Patients are able to view lab/test results, encounter notes, upcoming appointments, etc.  Non-urgent messages can be sent to your provider as well.   To learn more about what you can do with MyChart, go to [email or web address].    Your next appointment:   3 month(s)  The format for your next appointment:   In Person  Provider:   Lennie Odor, MD   Other instructions: [employer] device      Time Spent with Patient: I have spent a total of 25 minutes with patient reviewing hospital notes, telemetry, EKGs, labs and examining the patient as well as establishing an assessment and plan that was discussed with the patient.  > 50% of time was spent in direct patient care.  Signed, Lenna Gilford. Flora Lipps, MD, [facility]  [facility]  [address] [place], [place] [address] [phone]  05/18/2020 8:58 AM

## 2020-05-18 NOTE — Patient Instructions (Signed)
Medication Instructions:  The current medical regimen is effective;  continue present plan and medications.  *If you need a refill on your cardiac medications before your next appointment, please call your pharmacy*   Follow-Up: At [facility], you and your health needs are our priority.  As part of our continuing mission to provide you with exceptional heart care, we have created designated Provider Care Teams.  These Care Teams include your primary Cardiologist (physician) and Advanced Practice Providers (APPs -  Physician Assistants and Nurse Practitioners) who all work together to provide you with the care you need, when you need it.  We recommend signing up for the patient portal called "MyChart".  Sign up information is provided on this After Visit Summary.  MyChart is used to connect with patients for Virtual Visits (Telemedicine).  Patients are able to view lab/test results, encounter notes, upcoming appointments, etc.  Non-urgent messages can be sent to your provider as well.   To learn more about what you can do with MyChart, go to [email or web address].    Your next appointment:   3 month(s)  The format for your next appointment:   In Person  Provider:   Lennie Odor, MD   Other instructions: [facility] device

## 2020-06-14 ENCOUNTER — Ambulatory Visit: Payer: Medicare HMO | Admitting: Cardiovascular Disease

## 2020-06-14 ENCOUNTER — Encounter: Payer: Self-pay | Admitting: Cardiovascular Disease

## 2020-06-14 ENCOUNTER — Other Ambulatory Visit: Payer: Self-pay

## 2020-06-14 VITALS — BP 150/88 | HR 70 | Ht 62.0 in | Wt 114.2 lb

## 2020-06-14 DIAGNOSIS — R0602 Shortness of breath: Secondary | ICD-10-CM

## 2020-06-14 DIAGNOSIS — I480 Paroxysmal atrial fibrillation: Secondary | ICD-10-CM

## 2020-06-14 NOTE — Patient Instructions (Signed)
Medication Instructions:  The current medical regimen is effective;  continue present plan and medications.  *If you need a refill on your cardiac medications before your next appointment, please call your pharmacy*   Follow-Up: At [facility], you and your health needs are our priority.  As part of our continuing mission to provide you with exceptional heart care, we have created designated Provider Care Teams.  These Care Teams include your primary Cardiologist (physician) and Advanced Practice Providers (APPs -  Physician Assistants and Nurse Practitioners) who all work together to provide you with the care you need, when you need it.  We recommend signing up for the patient portal called "MyChart".  Sign up information is provided on this After Visit Summary.  MyChart is used to connect with patients for Virtual Visits (Telemedicine).  Patients are able to view lab/test results, encounter notes, upcoming appointments, etc.  Non-urgent messages can be sent to your provider as well.   To learn more about what you can do with MyChart, go to [email or web address].    Your next appointment:   Keep your scheduled appointment.

## 2020-06-14 NOTE — Progress Notes (Signed)
Cardiology Office Note:   Date:  06/14/2020  NAME:  Kathleen Santana    MRN: [id number] DOB:  1926/11/21   PCP:  Deloris Ping, MD  Cardiologist:  None  Electrophysiologist:  None   Referring MD: Deloris Ping, *   Chief Complaint  Patient presents with  . Follow-up    History of Present Illness:   Kathleen Santana is a 87 y.o. female with a hx of atrial fibrillation, hypertension, hyperlipidemia who presents for follow-up.  She presents with her daughter.  Since Monday she has had episodes of dizziness and lightheadedness.  She is also had cough.  Her weight has decreased from 120 pounds 114 pounds.  She is eating and drinking well.  She has noticeable rhonchi just listening to her talk.  She is also short of breath.  Her EKG today demonstrates she is maintaining sinus rhythm.  No fevers or chills.  She does feel warm on examination.  She has noticeable rhonchi on examination.  No lower extremity edema is seen.  Her heart rate is regular.  No infectious exposures.  She is having a cough.  No fevers reported but she does feel warm again as mentioned above.  No nausea or vomiting.  She has no evidence of congestive heart failure.  She is also had increased blood pressure values.  Suspect this is related to her acute illness.  Problem List 1. Paroxysmal Afib -Afib with RVR 04/06/2020 -4% Afib burden -CHADSVASC=4 (age, sex, HTN) 2. Atrial tachycardia/PACs -6.3% PAC burden  3. HTN 4. HLD 5. Hypothyroidism  Past Medical History: Past Medical History:  Diagnosis Date  . Afib (HCC)   . Arrhythmia   . GERD (gastroesophageal reflux disease)   . Hypertension   . Hypothyroidism   . Lower extremity neuropathy   . Osteoporosis   . Rhinitis, non-allergic     Past Surgical History: Past Surgical History:  Procedure Laterality Date  . BACK SURGERY    . NOSE SURGERY      Current Medications: Current Meds  Medication Sig  . amiodarone (PACERONE) 200 MG tablet  Take 1 tablet (200 mg total) by mouth daily.  Marland Kitchen apixaban (ELIQUIS) 2.5 MG TABS tablet Take 1 tablet (2.5 mg total) by mouth 2 (two) times daily.  . Cholecalciferol (VITAMIN D-3 PO) Take 1 capsule by mouth daily with breakfast.  . gabapentin (NEURONTIN) 300 MG capsule Take 300 mg by mouth in the morning, at noon, in the evening, and at bedtime.  Marland Kitchen ipratropium (ATROVENT) 0.03 % nasal spray Place 1-3 sprays into both nostrils in the morning and at bedtime.  Marland Kitchen levothyroxine (SYNTHROID) 50 MCG tablet Take 50 mcg by mouth daily before breakfast.  . lisinopril (ZESTRIL) 10 MG tablet Take 1 tablet (10 mg total) by mouth daily.  . Multiple Vitamins-Minerals (ONE-A-DAY WOMENS 50+ ADVANTAGE) TABS Take 1 tablet by mouth daily with breakfast.  . omeprazole (PRILOSEC) 20 MG capsule Take 20 mg by mouth daily as needed (before eating spicy/hot foods).      Allergies:    Other   Social History: Social History   Socioeconomic History  . Marital status: Widowed    Spouse name: Not on file  . Number of children: 2  . Years of education: Not on file  . Highest education level: Not on file  Occupational History  . Occupation: retired  Tobacco Use  . Smoking status: Former Smoker    Years: 16.00    Types: Cigarettes  . Smokeless tobacco:  Never Used  Substance and Sexual Activity  . Alcohol use: Never  . Drug use: Never  . Sexual activity: Not on file  Other Topics Concern  . Not on file  Social History Narrative  . Not on file   Social Determinants of Health   Financial Resource Strain: Not on file  Food Insecurity: Not on file  Transportation Needs: Not on file  Physical Activity: Not on file  Stress: Not on file  Social Connections: Not on file     Family History: The patient's family history includes Arrhythmia in her mother; Heart disease in her father.  ROS:   All other ROS reviewed and negative. Pertinent positives noted in the HPI.     EKGs/Labs/Other Studies Reviewed:   The  following studies were personally reviewed by me today:  EKG:  EKG is ordered today.  The ekg ordered today demonstrates normal sinus rhythm heart rate 70, no acute ischemic changes or evidence of infarction, and was personally reviewed by me.   Zio 05/14/2020  Impression: 1. Paroxysmal Atrial fibrillation detected (4% burden; longest duration 13 hours 37 minutes; average HR 94 bpm).  2. Atrial tachycardia episodes detected (103 episodes in 12 days; longest duration 19 beats).  3. Frequent PACs (6.3%).    TTE 02/29/2020 1. Left ventricular ejection fraction, by estimation, is 55 to 60%. The  left ventricle has normal function. The left ventricle has no regional  wall motion abnormalities. There is moderate asymmetric left ventricular  hypertrophy. Left ventricular  diastolic function could not be evaluated.  2. Right ventricular systolic function is normal. The right ventricular  size is normal.  3. The mitral valve is normal in structure. Mild to moderate mitral valve  regurgitation.  4. The aortic valve is normal in structure. Aortic valve regurgitation is  not visualized. No aortic stenosis is present.   Recent Labs: 01/24/2020: BUN 20; Creatinine, Ser 1.18; Hemoglobin 12.7; Platelets 235; Potassium 4.2; Sodium 134   Recent Lipid Panel No results found for: CHOL, TRIG, HDL, CHOLHDL, VLDL, LDLCALC, LDLDIRECT  Physical Exam:   VS:  BP (!) 150/88 (BP Location: Left Arm, Patient Position: Sitting, Cuff Size: Normal)   Pulse 70   Ht 5\' 2"  (1.575 m)   Wt 114 lb 3.2 oz (51.8 kg)   SpO2 99%   BMI 20.89 kg/m    Wt Readings from Last 3 Encounters:  06/14/20 114 lb 3.2 oz (51.8 kg)  05/18/20 120 lb 3.2 oz (54.5 kg)  04/12/20 121 lb 3.2 oz (55 kg)    General: Well nourished, well developed, in no acute distress Head: Atraumatic, normal size  Eyes: PEERLA, EOMI  Neck: Supple, no JVD Endocrine: No thryomegaly Cardiac: Normal S1, S2; RRR; no murmurs, rubs, or gallops Lungs:  Tachypnea noted, diffuse rhonchi and wheezing bilaterally Abd: Soft, nontender, no hepatomegaly  Ext: No edema, pulses 2+ Musculoskeletal: No deformities, BUE and BLE strength normal and equal Skin: Warm and dry, no rashes   Neuro: Alert and oriented to person, place, time, and situation, CNII-XII grossly intact, no focal deficits  Psych: Normal mood and affect   ASSESSMENT:   Kathleen Santana is a 87 y.o. female who presents for the following: 1. SOB (shortness of breath)   2. Paroxysmal atrial fibrillation (HCC)     PLAN:   1. SOB (shortness of breath) -She presents with 3 to 4 days of shortness of breath with cough.  She has noticeable rhonchi and wheezing on examination.  She has lost  roughly 6 pounds since her last visit.  There is no evidence of congestive heart failure. -She is maintaining sinus rhythm on amiodarone. -She has had an abrupt change in her condition.  Overall I feel she has pneumonia.  She is notably tachypneic and has rhonchi on examination.  I recommended she be evaluated in the emergency room.  I think she is stable enough as her vitals are stable.  Oxygenation shows 99% on room air.  I suspect she will need antibiotics.  She also needs a bit expedited evaluation than I can give her today. -She will see me as scheduled.  We will follow-up the results of her emergency room visit.  2. Paroxysmal atrial fibrillation (HCC) -Maintaining sinus rhythm.  On Eliquis 5 mg twice daily.  Current condition is not related to A. fib.  Disposition: Return in about 3 months (around 09/14/2020).  Medication Adjustments/Labs and Tests Ordered: Current medicines are reviewed at length with the patient today.  Concerns regarding medicines are outlined above.  Orders Placed This Encounter  Procedures  . EKG 12-Lead   No orders of the defined types were placed in this encounter.   Patient Instructions  Medication Instructions:  The current medical regimen is effective;   continue present plan and medications.  *If you need a refill on your cardiac medications before your next appointment, please call your pharmacy*   Follow-Up: At [facility], you and your health needs are our priority.  As part of our continuing mission to provide you with exceptional heart care, we have created designated Provider Care Teams.  These Care Teams include your primary Cardiologist (physician) and Advanced Practice Providers (APPs -  Physician Assistants and Nurse Practitioners) who all work together to provide you with the care you need, when you need it.  We recommend signing up for the patient portal called "MyChart".  Sign up information is provided on this After Visit Summary.  MyChart is used to connect with patients for Virtual Visits (Telemedicine).  Patients are able to view lab/test results, encounter notes, upcoming appointments, etc.  Non-urgent messages can be sent to your provider as well.   To learn more about what you can do with MyChart, go to [email or web address].    Your next appointment:   Keep your scheduled appointment.        Time Spent with Patient: I have spent a total of 25 minutes with patient reviewing hospital notes, telemetry, EKGs, labs and examining the patient as well as establishing an assessment and plan that was discussed with the patient.  > 50% of time was spent in direct patient care.  Signed, Lenna Gilford. Flora Lipps, MD, [facility]  [facility]  [address], [address] [place], [place] [address] [phone]  06/14/2020 5:01 PM

## 2020-07-11 ENCOUNTER — Other Ambulatory Visit: Payer: Self-pay

## 2020-07-18 MED ORDER — AMIODARONE HCL 200 MG PO TABS: 200.0000 mg | ORAL_TABLET | Freq: Every day | ORAL | 3 refills | Status: DC

## 2020-07-31 DIAGNOSIS — R0602 Shortness of breath: Secondary | ICD-10-CM | POA: Diagnosis not present

## 2020-07-31 DIAGNOSIS — R059 Cough, unspecified: Secondary | ICD-10-CM | POA: Diagnosis not present

## 2020-07-31 DIAGNOSIS — I517 Cardiomegaly: Secondary | ICD-10-CM | POA: Diagnosis not present

## 2020-07-31 DIAGNOSIS — R058 Other specified cough: Secondary | ICD-10-CM | POA: Diagnosis not present

## 2020-07-31 DIAGNOSIS — I700 Atherosclerosis of aorta: Secondary | ICD-10-CM | POA: Diagnosis not present

## 2020-08-07 NOTE — Progress Notes (Signed)
She is Cardiology Office Note:   Date:  08/08/2020  NAME:  Kathleen Santana    MRN: [id number] DOB:  10/23/1926   PCP:  Deloris Pingyter-Brown, Sherry M, MD  Cardiologist:  None  Electrophysiologist:  None   Referring MD: Deloris Pingyter-Brown, Sherry M, *   Chief Complaint  Patient presents with   Dizziness   History of Present Illness:   Kathleen FallsHelen E Santana is a 87 y.o. female with a hx of pAF, HTN, HLD, hypothyroidism who presents for follow-up for dizziness and SOB. Seen by PCP 7/18. CXR clear. Has had fluctuations in BP.   She was seen by me in early June.  She was sent to the emergency room with concerns for pneumonia.  They did diagnose her with a UTI.  She had symptoms of shortness of breath and dizziness at that time.  Symptoms improved but apparently over the past 3 to 4 weeks she is had elevated blood pressure at times as well as symptoms of dizziness and lightheadedness.  She also reports flushing.  She also reports she is short of breath.  She cannot walk her normal 3 miles.  She can only walk 1 mile fairly short of breath.  She reports no rapid heartbeat sensation.  Weights are stable.  She was seen by her primary care physician on 07/31/2020.  Chest x-ray showed clear lungs.  There was no evidence of congestive heart failure.  Her EKG today shows she is maintaining sinus rhythm.  She has no increased lower extremity edema.  Her cardiovascular examination is unremarkable and unchanged.  I did review her CT chest from the hospitalization visit on 07/03/2020 through the [facility] system.  There is mention of possible bronchiolitis.  Her lungs are clear today.  The only real change in medications has been her amiodarone.  I doubt she is having side effects from this.  She is only been on this for roughly 1 to 2 months.  Normal lites no would be to stop the medication.  No increased bleeding.  BP 160/78.  She presents with her water as well.  BP values are elevated over 170.  This could be contributing as well.   Her thyroid studies were normal recently.  No anemia.  She denies any infectious symptoms.  Her primary care physician was treating her for an upper respiratory infection with amoxicillin.  No prior reactions to penicillin in the past.  Problem List 1. Paroxysmal Afib -Afib with RVR 04/06/2020 -4% Afib burden -CHADSVASC=4 (age, sex, HTN) 2. Atrial tachycardia/PACs -6.3% PAC burden 3. HTN 4. HLD 5. Hypothyroidism     Past Medical History: Past Medical History:  Diagnosis Date   Afib (HCC)    Arrhythmia    GERD (gastroesophageal reflux disease)    Hypertension    Hypothyroidism    Lower extremity neuropathy    Osteoporosis    Rhinitis, non-allergic     Past Surgical History: Past Surgical History:  Procedure Laterality Date   BACK SURGERY     NOSE SURGERY      Current Medications: Current Meds  Medication Sig   apixaban (ELIQUIS) 2.5 MG TABS tablet Take 1 tablet (2.5 mg total) by mouth 2 (two) times daily.   Cholecalciferol (VITAMIN D-3 PO) Take 1 capsule by mouth daily with breakfast.   gabapentin (NEURONTIN) 300 MG capsule Take 300 mg by mouth in the morning, at noon, in the evening, and at bedtime.   ipratropium (ATROVENT) 0.03 % nasal spray Place 1-3 sprays into both nostrils  in the morning and at bedtime.   levothyroxine (SYNTHROID) 50 MCG tablet Take 50 mcg by mouth daily before breakfast.   metoprolol succinate (TOPROL XL) 25 MG 24 hr tablet Take 1 tablet (25 mg total) by mouth daily.   Multiple Vitamins-Minerals (ONE-A-DAY WOMENS 50+ ADVANTAGE) TABS Take 1 tablet by mouth daily with breakfast.   omeprazole (PRILOSEC) 20 MG capsule Take 20 mg by mouth daily as needed (before eating spicy/hot foods).    [DISCONTINUED] amiodarone (PACERONE) 200 MG tablet Take 1 tablet (200 mg total) by mouth daily.     Allergies:    Other   Social History: Social History   Socioeconomic History   Marital status: Widowed    Spouse name: Not on file   Number of children: 2    Years of education: Not on file   Highest education level: Not on file  Occupational History   Occupation: retired  Tobacco Use   Smoking status: Former    Years: 16.00    Types: Cigarettes   Smokeless tobacco: Never  Substance and Sexual Activity   Alcohol use: Never   Drug use: Never   Sexual activity: Not on file  Other Topics Concern   Not on file  Social History Narrative   Not on file   Social Determinants of Health   Financial Resource Strain: Not on file  Food Insecurity: Not on file  Transportation Needs: Not on file  Physical Activity: Not on file  Stress: Not on file  Social Connections: Not on file     Family History: The patient's family history includes Arrhythmia in her mother; Heart disease in her father.  ROS:   All other ROS reviewed and negative. Pertinent positives noted in the HPI.     EKGs/Labs/Other Studies Reviewed:   The following studies were personally reviewed by me today:  EKG:  EKG is ordered today.  The ekg ordered today demonstrates normal sinus rhythm heart rate 61, no acute ischemic changes or evidence of infarction, and was personally reviewed by me.   TTE 02/29/2020  1. Left ventricular ejection fraction, by estimation, is 55 to 60%. The  left ventricle has normal function. The left ventricle has no regional  wall motion abnormalities. There is moderate asymmetric left ventricular  hypertrophy. Left ventricular  diastolic function could not be evaluated.   2. Right ventricular systolic function is normal. The right ventricular  size is normal.   3. The mitral valve is normal in structure. Mild to moderate mitral valve  regurgitation.   4. The aortic valve is normal in structure. Aortic valve regurgitation is  not visualized. No aortic stenosis is present.   Recent Labs: 01/24/2020: BUN 20; Creatinine, Ser 1.18; Hemoglobin 12.7; Platelets 235; Potassium 4.2; Sodium 134   Recent Lipid Panel No results found for: CHOL, TRIG, HDL,  CHOLHDL, VLDL, LDLCALC, LDLDIRECT  Physical Exam:   VS:  BP (!) 160/78   Pulse 61   Ht 5\' 2"  (1.575 m)   Wt 119 lb 12.8 oz (54.3 kg)   SpO2 98%   BMI 21.91 kg/m    Wt Readings from Last 3 Encounters:  08/08/20 119 lb 12.8 oz (54.3 kg)  06/14/20 114 lb 3.2 oz (51.8 kg)  05/18/20 120 lb 3.2 oz (54.5 kg)    General: Well nourished, well developed, in no acute distress Head: Atraumatic, normal size  Eyes: PEERLA, EOMI  Neck: Supple, no JVD Endocrine: No thryomegaly Cardiac: Normal S1, S2; RRR; no murmurs, rubs, or gallops  Lungs: Clear to auscultation bilaterally, no wheezing, rhonchi or rales  Abd: Soft, nontender, no hepatomegaly  Ext: No edema, pulses 2+ Musculoskeletal: No deformities, BUE and BLE strength normal and equal Skin: Warm and dry, no rashes   Neuro: Alert and oriented to person, place, time, and situation, CNII-XII grossly intact, no focal deficits  Psych: Normal mood and affect   ASSESSMENT:   Kathleen Santana is a 87 y.o. female who presents for the following: 1. Dizziness   2. SOB (shortness of breath)   3. Paroxysmal atrial fibrillation (HCC)   4. Adequate anticoagulation on anticoagulant therapy   5. Primary hypertension     PLAN:   1. Dizziness 2. SOB (shortness of breath) -She reports episodes of dizziness and shortness of breath.  She also reports flushing.  Recent thyroid studies were normal.  Chest x-ray by primary care physician shows no infiltrates.  No evidence of congestive heart failure examination.  EKG shows she is maintaining sinus rhythm. -I do wonder if she is possibly having side effect from amoxicillin.  She is now being treated for an upper respiratory infection.  I doubt she is having side effect from amiodarone but the only way to know is to stop this medication.  We will stop it and see if this makes any improvement.  She has no history of COPD.  She has no evidence of congestive heart failure.  She reports she is not stressed or  depressed.  Unclear what is going on here.  Just to make sure we will see if amiodarone cessation improves her symptoms.  3. Paroxysmal atrial fibrillation (HCC) -On Eliquis 2.5 mg twice daily. -She is had recurrent rapid A. fib episodes with presyncope.  Echocardiogram shows normal LV function. -She reports flushing as well as shortness of breath.  We will stop her amiodarone to see if this is a side effect of this medication.  In the interim we will start metoprolol succinate 25 mg daily to prevent any rapid A. fib from happening.  I recommended adequate hydration as well as regular exercise.  We will see if this makes a difference.  I will see her back in 6 weeks to discuss further. -If she continues to have A. fib off amiodarone and cannot tolerate this she may need to consider evaluation for pacemaker.  She was not able to afford her amiodarone.  This medication would have been ideal.  4. Adequate anticoagulation on anticoagulant therapy -On Eliquis.  5. Primary hypertension -BP has been elevated.  Suspect this is possibly related to amoxicillin.  We will increase her lisinopril to 20 mg twice a day.  Disposition: Return in about 6 weeks (around 09/19/2020).  Medication Adjustments/Labs and Tests Ordered: Current medicines are reviewed at length with the patient today.  Concerns regarding medicines are outlined above.  Orders Placed This Encounter  Procedures   EKG 12-Lead   Meds ordered this encounter  Medications   lisinopril (ZESTRIL) 10 MG tablet    Sig: Take 1 tablet (10 mg total) by mouth in the morning and at bedtime.    Dispense:  90 tablet    Refill:  3   metoprolol succinate (TOPROL XL) 25 MG 24 hr tablet    Sig: Take 1 tablet (25 mg total) by mouth daily.    Dispense:  90 tablet    Refill:  1    Patient Instructions  Medication Instructions:  Stop Amiodarone  Start Metoprolol Succinate 25 mg daily  Take Lisinopril twice daily   *  If you need a refill on your  cardiac medications before your next appointment, please call your pharmacy*   Follow-Up: At [facility], you and your health needs are our priority.  As part of our continuing mission to provide you with exceptional heart care, we have created designated Provider Care Teams.  These Care Teams include your primary Cardiologist (physician) and Advanced Practice Providers (APPs -  Physician Assistants and Nurse Practitioners) who all work together to provide you with the care you need, when you need it.  We recommend signing up for the patient portal called "MyChart".  Sign up information is provided on this After Visit Summary.  MyChart is used to connect with patients for Virtual Visits (Telemedicine).  Patients are able to view lab/test results, encounter notes, upcoming appointments, etc.  Non-urgent messages can be sent to your provider as well.   To learn more about what you can do with MyChart, go to [email or web address].    Your next appointment:   Keep as scheduled.   Time Spent with Patient: I have spent a total of 35 minutes with patient reviewing hospital notes, telemetry, EKGs, labs and examining the patient as well as establishing an assessment and plan that was discussed with the patient.  > 50% of time was spent in direct patient care.  Signed, Lenna Gilford. Flora Lipps, MD, [facility]  [facility]  [address] [place], [place] [address] [phone]  08/08/2020 4:50 PM

## 2020-08-07 NOTE — Telephone Encounter (Signed)
Returned call to pt. She report for the past month she's been feeling really dizzy and faint when she stand up. She state some days are better than other but symptoms usually occur every other day. Today she feels well. BP readings listed below but state dizziness occur with high and low readings. Pt also report she normally walk 3 miles a day but now only able to complete 1 due to SOB. She denies changes in weight or swelling.    7/18-171/77  7/19-181/66 7/22-138/76 (am) 140/62 (afternoon) 7/23- 86/41 (am/felt dizzy) 116/56 (afternoon) 7/24-154/62 am HR 60 162/64 (afternoon)   Appointment scheduled for 7/26 @ 4 pm with Dr. Scharlene Gloss for further evaluations. Pt made aware of ED precaution should any new symptoms develop or worsen.

## 2020-08-08 ENCOUNTER — Other Ambulatory Visit: Payer: Self-pay

## 2020-08-08 ENCOUNTER — Encounter: Payer: Self-pay | Admitting: Cardiovascular Disease

## 2020-08-08 ENCOUNTER — Ambulatory Visit: Payer: Medicare HMO | Admitting: Cardiovascular Disease

## 2020-08-08 VITALS — BP 160/78 | HR 61 | Ht 62.0 in | Wt 119.8 lb

## 2020-08-08 DIAGNOSIS — Z7901 Long term (current) use of anticoagulants: Secondary | ICD-10-CM

## 2020-08-08 DIAGNOSIS — R0602 Shortness of breath: Secondary | ICD-10-CM

## 2020-08-08 DIAGNOSIS — R42 Dizziness and giddiness: Secondary | ICD-10-CM

## 2020-08-08 DIAGNOSIS — I480 Paroxysmal atrial fibrillation: Secondary | ICD-10-CM

## 2020-08-08 DIAGNOSIS — I10 Essential (primary) hypertension: Secondary | ICD-10-CM

## 2020-08-08 MED ORDER — LISINOPRIL 10 MG PO TABS
10.0000 mg | ORAL_TABLET | Freq: Two times a day (BID) | ORAL | 3 refills | Status: DC
Start: 2020-08-08 — End: 2021-03-20

## 2020-08-08 MED ORDER — METOPROLOL SUCCINATE ER 25 MG PO TB24: 25.0000 mg | ORAL_TABLET | Freq: Every day | ORAL | 1 refills | Status: DC

## 2020-08-08 NOTE — Patient Instructions (Signed)
Medication Instructions:  Stop Amiodarone  Start Metoprolol Succinate 25 mg daily  Take Lisinopril twice daily   *If you need a refill on your cardiac medications before your next appointment, please call your pharmacy*   Follow-Up: At [facility], you and your health needs are our priority.  As part of our continuing mission to provide you with exceptional heart care, we have created designated Provider Care Teams.  These Care Teams include your primary Cardiologist (physician) and Advanced Practice Providers (APPs -  Physician Assistants and Nurse Practitioners) who all work together to provide you with the care you need, when you need it.  We recommend signing up for the patient portal called "MyChart".  Sign up information is provided on this After Visit Summary.  MyChart is used to connect with patients for Virtual Visits (Telemedicine).  Patients are able to view lab/test results, encounter notes, upcoming appointments, etc.  Non-urgent messages can be sent to your provider as well.   To learn more about what you can do with MyChart, go to [email or web address].    Your next appointment:   Keep as scheduled.

## 2020-08-09 ENCOUNTER — Other Ambulatory Visit: Payer: Self-pay | Admitting: Cardiovascular Disease

## 2020-08-09 NOTE — Telephone Encounter (Signed)
Prescription refill request for Eliquis received. Last office visit:[place]'ne[place] 08/08/20 Scr:1.19 06/14/20 Age: 81f Weight:54.3kg

## 2020-09-13 DIAGNOSIS — E039 Hypothyroidism, unspecified: Secondary | ICD-10-CM | POA: Diagnosis not present

## 2020-09-13 DIAGNOSIS — D6869 Other thrombophilia: Secondary | ICD-10-CM | POA: Diagnosis not present

## 2020-09-13 DIAGNOSIS — M8580 Other specified disorders of bone density and structure, unspecified site: Secondary | ICD-10-CM | POA: Diagnosis not present

## 2020-09-13 DIAGNOSIS — I739 Peripheral vascular disease, unspecified: Secondary | ICD-10-CM | POA: Diagnosis not present

## 2020-09-13 DIAGNOSIS — G629 Polyneuropathy, unspecified: Secondary | ICD-10-CM | POA: Diagnosis not present

## 2020-09-13 DIAGNOSIS — I10 Essential (primary) hypertension: Secondary | ICD-10-CM | POA: Diagnosis not present

## 2020-09-13 DIAGNOSIS — I2510 Atherosclerotic heart disease of native coronary artery without angina pectoris: Secondary | ICD-10-CM | POA: Diagnosis not present

## 2020-09-13 DIAGNOSIS — J301 Allergic rhinitis due to pollen: Secondary | ICD-10-CM | POA: Diagnosis not present

## 2020-09-13 DIAGNOSIS — I4891 Unspecified atrial fibrillation: Secondary | ICD-10-CM | POA: Diagnosis not present

## 2020-09-13 DIAGNOSIS — K219 Gastro-esophageal reflux disease without esophagitis: Secondary | ICD-10-CM | POA: Diagnosis not present

## 2020-09-13 DIAGNOSIS — Z008 Encounter for other general examination: Secondary | ICD-10-CM | POA: Diagnosis not present

## 2020-09-13 DIAGNOSIS — Z7901 Long term (current) use of anticoagulants: Secondary | ICD-10-CM | POA: Diagnosis not present

## 2020-09-13 DIAGNOSIS — Z803 Family history of malignant neoplasm of breast: Secondary | ICD-10-CM | POA: Diagnosis not present

## 2020-09-18 NOTE — Progress Notes (Signed)
Cardiology Office Note:   Date:  09/19/2020  NAME:  Kathleen Santana    MRN: [id number] DOB:  03-17-1926   PCP:  Deloris Ping, MD  Cardiologist:  None  Electrophysiologist:  None   Referring MD: Deloris Ping, *   Chief Complaint  Patient presents with   Follow-up   History of Present Illness:   Kathleen Santana is a 87 y.o. female with a hx of pAF, HTN, HLD who presents for follow-up. Continued to explain of exercise intolerance. We stopped amiodarone at last visit.  She reports she is doing well.  We stopped her amiodarone she is noticed improvement in her shortness of breath.  She can walk up to 3 miles with no limitations.  Recently went on a beach trip and climb a 136 stairs at [employer].  She apparently is the oldest female in the state to have done this.  This was in [facility].  No bleeding issues on Eliquis.  Her blood pressure is 180/70.  She reports that it ranges between 160 170.  If it gets much lower she feels weak and fatigued.  We did increase her lisinopril to 10 mg twice a day.  She reports values are stable at home.  She reports she does feel a bit dizzy when she takes too much medication.  Tolerating metoprolol well.  Denies any chest pain or trouble breathing.  Overall she seems to be doing well.  Problem List 1. Paroxysmal Afib -Afib with RVR 04/06/2020 -4% Afib burden -CHADSVASC=4 (age, sex, HTN) 2. Atrial tachycardia/PACs -6.3% PAC burden 3. HTN 4. HLD 5. Hypothyroidism   Past Medical History: Past Medical History:  Diagnosis Date   Afib (HCC)    Arrhythmia    GERD (gastroesophageal reflux disease)    Hypertension    Hypothyroidism    Lower extremity neuropathy    Osteoporosis    Rhinitis, non-allergic     Past Surgical History: Past Surgical History:  Procedure Laterality Date   BACK SURGERY     NOSE SURGERY      Current Medications: Current Meds  Medication Sig   apixaban (ELIQUIS) 2.5 MG TABS tablet  TAKE ONE TABLET BY MOUTH TWICE A DAY   Cholecalciferol (VITAMIN D-3 PO) Take 1 capsule by mouth daily with breakfast.   gabapentin (NEURONTIN) 300 MG capsule Take 300 mg by mouth in the morning, at noon, in the evening, and at bedtime.   ipratropium (ATROVENT) 0.03 % nasal spray Place 1-3 sprays into both nostrils in the morning and at bedtime.   levothyroxine (SYNTHROID) 50 MCG tablet Take 50 mcg by mouth daily before breakfast.   lisinopril (ZESTRIL) 10 MG tablet Take 1 tablet (10 mg total) by mouth in the morning and at bedtime.   metoprolol succinate (TOPROL XL) 25 MG 24 hr tablet Take 1 tablet (25 mg total) by mouth daily.   Multiple Vitamins-Minerals (ONE-A-DAY WOMENS 50+ ADVANTAGE) TABS Take 1 tablet by mouth daily with breakfast.   omeprazole (PRILOSEC) 20 MG capsule Take 20 mg by mouth daily as needed (before eating spicy/hot foods).      Allergies:    Nickel and Other   Social History: Social History   Socioeconomic History   Marital status: Widowed    Spouse name: Not on file   Number of children: 2   Years of education: Not on file   Highest education level: Not on file  Occupational History   Occupation: retired  Tobacco Use  Smoking status: Former    Years: 16.00    Types: Cigarettes   Smokeless tobacco: Never  Substance and Sexual Activity   Alcohol use: Never   Drug use: Never   Sexual activity: Not on file  Other Topics Concern   Not on file  Social History Narrative   Not on file   Social Determinants of Health   Financial Resource Strain: Not on file  Food Insecurity: Not on file  Transportation Needs: Not on file  Physical Activity: Not on file  Stress: Not on file  Social Connections: Not on file     Family History: The patient's family history includes Arrhythmia in her mother; Heart disease in her father.  ROS:   All other ROS reviewed and negative. Pertinent positives noted in the HPI.     EKGs/Labs/Other Studies Reviewed:   The  following studies were personally reviewed by me today:  [facility] 05/04/2020 1. Paroxysmal Atrial fibrillation detected (4% burden; longest duration 13 hours 37 minutes; average HR 94 bpm).  2. Atrial tachycardia episodes detected (103 episodes in 12 days; longest duration 19 beats).  3. Frequent PACs (6.3%).   TTE 02/29/2020  1. Left ventricular ejection fraction, by estimation, is 55 to 60%. The  left ventricle has normal function. The left ventricle has no regional  wall motion abnormalities. There is moderate asymmetric left ventricular  hypertrophy. Left ventricular  diastolic function could not be evaluated.   2. Right ventricular systolic function is normal. The right ventricular  size is normal.   3. The mitral valve is normal in structure. Mild to moderate mitral valve  regurgitation.   4. The aortic valve is normal in structure. Aortic valve regurgitation is  not visualized. No aortic stenosis is present.  Recent Labs: 01/24/2020: BUN 20; Creatinine, Ser 1.18; Hemoglobin 12.7; Platelets 235; Potassium 4.2; Sodium 134   Recent Lipid Panel No results found for: CHOL, TRIG, HDL, CHOLHDL, VLDL, LDLCALC, LDLDIRECT  Physical Exam:   VS:  BP (!) 181/70   Pulse (!) 56   Ht 5\' 2"  (1.575 m)   Wt 121 lb (54.9 kg)   SpO2 100%   BMI 22.13 kg/m    Wt Readings from Last 3 Encounters:  09/19/20 121 lb (54.9 kg)  08/08/20 119 lb 12.8 oz (54.3 kg)  06/14/20 114 lb 3.2 oz (51.8 kg)    General: Well nourished, well developed, in no acute distress Head: Atraumatic, normal size  Eyes: PEERLA, EOMI  Neck: Supple, no JVD Endocrine: No thryomegaly Cardiac: Normal S1, S2; RRR; no murmurs, rubs, or gallops Lungs: Clear to auscultation bilaterally, no wheezing, rhonchi or rales  Abd: Soft, nontender, no hepatomegaly  Ext: No edema, pulses 2+ Musculoskeletal: No deformities, BUE and BLE strength normal and equal Skin: Warm and dry, no rashes   Neuro: Alert and oriented to person, place, time,  and situation, CNII-XII grossly intact, no focal deficits  Psych: Normal mood and affect   ASSESSMENT:   Kathleen Santana is a 87 y.o. female who presents for the following: 1. Paroxysmal atrial fibrillation (HCC)   2. Atrial tachycardia (HCC)   3. Adequate anticoagulation on anticoagulant therapy   4. Primary hypertension   5. SOB (shortness of breath)   6. Dizziness     PLAN:   1. Paroxysmal atrial fibrillation (HCC) 2. Atrial tachycardia (HCC) 3. Adequate anticoagulation on anticoagulant therapy -Recurrent atrial fibrillation with 4% burden.  She is on Eliquis no bleeding issues. -Due to rapid A. fib with syncopal  episodes we did try her on dronedarone.  Cost was an issue.  We did settle on amiodarone given age however she reports she had shortness of breath and symptoms with this.  We did up stopping this and she has done better.  I have been reluctant to pursue 1C agents given her age.  We may need to consider this in the future.  For now she seems to be doing well without any recurrence of her A. fib.  We will continue to hold amiodarone.  She remains on metoprolol succinate 25 mg daily. -We will continue to monitor and see how she does.  I will see her back in 6 months.  4. Primary hypertension -Blood pressure bit elevated 180/70 today.  Reports it runs between 160- 170 at home.  We increased her lisinopril at her last visit.  I would like for her to start keeping a log of her blood pressure.  She will do this 2-3 times per week.  She is feeling great and I really see no need to aggressively treat this.  Given her age I only see side effects and adverse reactions if we become aggressive.  5. SOB (shortness of breath) 6. Dizziness -Improved with stopping amiodarone.  Continue to hold this.  Disposition: Return in about 6 months (around 03/19/2021).  Medication Adjustments/Labs and Tests Ordered: Current medicines are reviewed at length with the patient today.  Concerns regarding  medicines are outlined above.  No orders of the defined types were placed in this encounter.  No orders of the defined types were placed in this encounter.   Patient Instructions  Medication Instructions:  The current medical regimen is effective;  continue present plan and medications.  *If you need a refill on your cardiac medications before your next appointment, please call your pharmacy*   Follow-Up: At [facility], you and your health needs are our priority.  As part of our continuing mission to provide you with exceptional heart care, we have created designated Provider Care Teams.  These Care Teams include your primary Cardiologist (physician) and Advanced Practice Providers (APPs -  Physician Assistants and Nurse Practitioners) who all work together to provide you with the care you need, when you need it.  We recommend signing up for the patient portal called "MyChart".  Sign up information is provided on this After Visit Summary.  MyChart is used to connect with patients for Virtual Visits (Telemedicine).  Patients are able to view lab/test results, encounter notes, upcoming appointments, etc.  Non-urgent messages can be sent to your provider as well.   To learn more about what you can do with MyChart, go to [email or web address].    Your next appointment:   6 month(s)  The format for your next appointment:   In Person  Provider:   Lennie Odor, MD      Time Spent with Patient: I have spent a total of 35 minutes with patient reviewing hospital notes, telemetry, EKGs, labs and examining the patient as well as establishing an assessment and plan that was discussed with the patient.  > 50% of time was spent in direct patient care.  Signed, Lenna Gilford. Flora Lipps, MD, [facility]  [facility]  [address], [address] [place], [place] [address] [phone]  09/19/2020 11:01 AM

## 2020-09-19 ENCOUNTER — Encounter: Payer: Self-pay | Admitting: Cardiovascular Disease

## 2020-09-19 ENCOUNTER — Ambulatory Visit: Payer: Medicare HMO | Admitting: Cardiovascular Disease

## 2020-09-19 ENCOUNTER — Other Ambulatory Visit: Payer: Self-pay

## 2020-09-19 VITALS — BP 181/70 | HR 56 | Ht 62.0 in | Wt 121.0 lb

## 2020-09-19 DIAGNOSIS — Z7901 Long term (current) use of anticoagulants: Secondary | ICD-10-CM

## 2020-09-19 DIAGNOSIS — R0602 Shortness of breath: Secondary | ICD-10-CM | POA: Diagnosis not present

## 2020-09-19 DIAGNOSIS — I4719 Other supraventricular tachycardia: Secondary | ICD-10-CM

## 2020-09-19 DIAGNOSIS — R42 Dizziness and giddiness: Secondary | ICD-10-CM | POA: Diagnosis not present

## 2020-09-19 DIAGNOSIS — I480 Paroxysmal atrial fibrillation: Secondary | ICD-10-CM | POA: Diagnosis not present

## 2020-09-19 DIAGNOSIS — I10 Essential (primary) hypertension: Secondary | ICD-10-CM

## 2020-09-19 DIAGNOSIS — I471 Supraventricular tachycardia: Secondary | ICD-10-CM

## 2020-09-19 NOTE — Patient Instructions (Signed)
Medication Instructions:  The current medical regimen is effective;  continue present plan and medications.  *If you need a refill on your cardiac medications before your next appointment, please call your pharmacy*   Follow-Up: At [facility], you and your health needs are our priority.  As part of our continuing mission to provide you with exceptional heart care, we have created designated Provider Care Teams.  These Care Teams include your primary Cardiologist (physician) and Advanced Practice Providers (APPs -  Physician Assistants and Nurse Practitioners) who all work together to provide you with the care you need, when you need it.  We recommend signing up for the patient portal called "MyChart".  Sign up information is provided on this After Visit Summary.  MyChart is used to connect with patients for Virtual Visits (Telemedicine).  Patients are able to view lab/test results, encounter notes, upcoming appointments, etc.  Non-urgent messages can be sent to your provider as well.   To learn more about what you can do with MyChart, go to [email or web address].    Your next appointment:   6 month(s)  The format for your next appointment:   In Person  Provider:   [place] O'Neal, MD      

## 2020-10-05 ENCOUNTER — Other Ambulatory Visit: Payer: Self-pay | Admitting: Cardiovascular Disease

## 2020-10-05 NOTE — Telephone Encounter (Signed)
D/C 04/17/20

## 2020-12-15 DIAGNOSIS — G9602 Spinal cerebrospinal fluid leak, spontaneous: Secondary | ICD-10-CM | POA: Diagnosis not present

## 2020-12-19 DIAGNOSIS — R202 Paresthesia of skin: Secondary | ICD-10-CM | POA: Diagnosis not present

## 2020-12-19 DIAGNOSIS — G629 Polyneuropathy, unspecified: Secondary | ICD-10-CM | POA: Diagnosis not present

## 2020-12-19 DIAGNOSIS — M79673 Pain in unspecified foot: Secondary | ICD-10-CM | POA: Diagnosis not present

## 2020-12-25 DIAGNOSIS — J069 Acute upper respiratory infection, unspecified: Secondary | ICD-10-CM | POA: Diagnosis not present

## 2020-12-25 DIAGNOSIS — R058 Other specified cough: Secondary | ICD-10-CM | POA: Diagnosis not present

## 2020-12-29 ENCOUNTER — Other Ambulatory Visit: Payer: Self-pay | Admitting: Cardiovascular Disease

## 2021-01-11 DIAGNOSIS — H02834 Dermatochalasis of left upper eyelid: Secondary | ICD-10-CM | POA: Diagnosis not present

## 2021-01-11 DIAGNOSIS — H04123 Dry eye syndrome of bilateral lacrimal glands: Secondary | ICD-10-CM | POA: Diagnosis not present

## 2021-01-11 DIAGNOSIS — H02831 Dermatochalasis of right upper eyelid: Secondary | ICD-10-CM | POA: Diagnosis not present

## 2021-01-11 DIAGNOSIS — H527 Unspecified disorder of refraction: Secondary | ICD-10-CM | POA: Diagnosis not present

## 2021-01-11 DIAGNOSIS — Z961 Presence of intraocular lens: Secondary | ICD-10-CM | POA: Diagnosis not present

## 2021-01-11 DIAGNOSIS — H43813 Vitreous degeneration, bilateral: Secondary | ICD-10-CM | POA: Diagnosis not present

## 2021-01-11 DIAGNOSIS — H353131 Nonexudative age-related macular degeneration, bilateral, early dry stage: Secondary | ICD-10-CM | POA: Diagnosis not present

## 2021-01-22 DIAGNOSIS — M79671 Pain in right foot: Secondary | ICD-10-CM | POA: Diagnosis not present

## 2021-01-22 DIAGNOSIS — S92515A Nondisplaced fracture of proximal phalanx of left lesser toe(s), initial encounter for closed fracture: Secondary | ICD-10-CM | POA: Diagnosis not present

## 2021-01-22 DIAGNOSIS — I10 Essential (primary) hypertension: Secondary | ICD-10-CM | POA: Diagnosis not present

## 2021-01-22 DIAGNOSIS — M79675 Pain in left toe(s): Secondary | ICD-10-CM | POA: Diagnosis not present

## 2021-01-22 DIAGNOSIS — M79672 Pain in left foot: Secondary | ICD-10-CM | POA: Diagnosis not present

## 2021-01-23 ENCOUNTER — Encounter: Payer: Self-pay | Admitting: Cardiovascular Disease

## 2021-01-23 DIAGNOSIS — S92515A Nondisplaced fracture of proximal phalanx of left lesser toe(s), initial encounter for closed fracture: Secondary | ICD-10-CM | POA: Diagnosis not present

## 2021-02-02 ENCOUNTER — Other Ambulatory Visit: Payer: Self-pay | Admitting: Cardiovascular Disease

## 2021-02-02 NOTE — Telephone Encounter (Signed)
Prescription refill request for Eliquis received. Indication:Afib Last office visit:9/22 Scr:1.1 Age: 88 Weight:54.9 kg  Prescription refilled

## 2021-02-19 DIAGNOSIS — I10 Essential (primary) hypertension: Secondary | ICD-10-CM | POA: Diagnosis not present

## 2021-02-23 DIAGNOSIS — M7741 Metatarsalgia, right foot: Secondary | ICD-10-CM | POA: Diagnosis not present

## 2021-02-23 DIAGNOSIS — Q6671 Congenital pes cavus, right foot: Secondary | ICD-10-CM | POA: Diagnosis not present

## 2021-02-23 DIAGNOSIS — M216X1 Other acquired deformities of right foot: Secondary | ICD-10-CM | POA: Diagnosis not present

## 2021-02-23 DIAGNOSIS — M79671 Pain in right foot: Secondary | ICD-10-CM | POA: Diagnosis not present

## 2021-03-20 ENCOUNTER — Other Ambulatory Visit: Payer: Self-pay | Admitting: Cardiovascular Disease

## 2021-03-30 ENCOUNTER — Ambulatory Visit: Payer: Medicare HMO | Admitting: Cardiovascular Disease

## 2021-04-04 NOTE — Progress Notes (Signed)
?Cardiology Office Note:   ?Date:  04/05/2021  ?NAME:  Kathleen Santana    ?MRN: [id number] ?DOB:  06-24-1926  ? ?PCP:  Deloris Ping, MD  ?Cardiologist:  None  ?Electrophysiologist:  None  ? ?Referring MD: Deloris Ping, *  ? ?Chief Complaint  ?Patient presents with  ? Follow-up  ?   ?  ? ?History of Present Illness:   ?Kathleen Santana is a 88 y.o. female with a hx of pAF, HTN, Hld who presents for follow-up.  She reports he is doing well.  Blood pressure 152/78.  Well-controlled at home.  Currently on amlodipine 2.5 mg daily.  Lisinopril 10 mg daily as well as metoprolol succinate 25 mg daily.  Still walking 3 miles every other day.  No chest pain or trouble breathing.  She is doing quite well.  No A-fib reported.  No palpitations.  No dizziness or lightheadedness.  Remains on Eliquis.  Unfortunately this is not generic.  She seems to be doing quite well.  No recurrence of A-fib. ? ?Problem List ?1. Paroxysmal Afib ?-Afib with RVR 04/06/2020 ?-4% Afib burden ?-CHADSVASC=4 (age, sex, HTN) ?2. Atrial tachycardia/PACs ?-6.3% PAC burden ?3. HTN ?4. HLD ?5. Hypothyroidism  ? ?Past Medical History: ?Past Medical History:  ?Diagnosis Date  ? Afib (HCC)   ? Arrhythmia   ? GERD (gastroesophageal reflux disease)   ? Hypertension   ? Hypothyroidism   ? Lower extremity neuropathy   ? Osteoporosis   ? Rhinitis, non-allergic   ? ? ?Past Surgical History: ?Past Surgical History:  ?Procedure Laterality Date  ? BACK SURGERY    ? NOSE SURGERY    ? ? ?Current Medications: ?Current Meds  ?Medication Sig  ? amLODipine (NORVASC) 2.5 MG tablet Take 2.5 mg by mouth daily.  ? apixaban (ELIQUIS) 2.5 MG TABS tablet TAKE ONE TABLET BY MOUTH TWICE A DAY  ? apixaban (ELIQUIS) 2.5 MG TABS tablet Take 2.5 mg by mouth in the morning and at bedtime.  ? Cholecalciferol (VITAMIN D-3 PO) Take 1 capsule by mouth daily with breakfast.  ? gabapentin (NEURONTIN) 300 MG capsule Take 300 mg by mouth in the morning, at noon, in the  evening, and at bedtime.  ? ipratropium (ATROVENT) 0.03 % nasal spray Place 1-3 sprays into both nostrils in the morning and at bedtime.  ? levothyroxine (SYNTHROID) 50 MCG tablet Take 50 mcg by mouth daily before breakfast.  ? lisinopril (ZESTRIL) 10 MG tablet TAKE ONE TABLET BY MOUTH EVERY MORNING AND EVERY EVENING  ? metoprolol succinate (TOPROL-XL) 25 MG 24 hr tablet TAKE ONE TABLET BY MOUTH DAILY  ? Multiple Vitamins-Minerals (ONE-A-DAY WOMENS 50+ ADVANTAGE) TABS Take 1 tablet by mouth daily with breakfast.  ? omeprazole (PRILOSEC) 20 MG capsule Take 20 mg by mouth daily as needed (before eating spicy/hot foods).   ?  ? ?Allergies:    ?Nickel and Other  ? ?Social History: ?Social History  ? ?Socioeconomic History  ? Marital status: Widowed  ?  Spouse name: Not on file  ? Number of children: 2  ? Years of education: Not on file  ? Highest education level: Not on file  ?Occupational History  ? Occupation: retired  ?Tobacco Use  ? Smoking status: Former  ?  Years: 16.00  ?  Types: Cigarettes  ? Smokeless tobacco: Never  ?Substance and Sexual Activity  ? Alcohol use: Never  ? Drug use: Never  ? Sexual activity: Not on file  ?Other Topics Concern  ? Not  on file  ?Social History Narrative  ? Not on file  ? ?Social Determinants of Health  ? ?Financial Resource Strain: Not on file  ?Food Insecurity: Not on file  ?Transportation Needs: Not on file  ?Physical Activity: Not on file  ?Stress: Not on file  ?Social Connections: Not on file  ?  ? ?Family History: ?The patient's family history includes Arrhythmia in her mother; Heart disease in her father. ? ?ROS:   ?All other ROS reviewed and negative. Pertinent positives noted in the HPI.    ? ?EKGs/Labs/Other Studies Reviewed:   ?The following studies were personally reviewed by me today: ? ?[facility] 05/14/2020 ?Impression: ?1. Paroxysmal Atrial fibrillation detected (4% burden; longest duration 13 hours 37 minutes; average HR 94 bpm).  ?2. Atrial tachycardia episodes detected  (103 episodes in 12 days; longest duration 19 beats).  ?3. Frequent PACs (6.3%).  ? ?Recent Labs: ?No results found for requested labs within last 8760 hours.  ? ?Recent Lipid Panel ?No results found for: CHOL, TRIG, HDL, CHOLHDL, VLDL, LDLCALC, LDLDIRECT ? ?Physical Exam:   ?VS:  BP (!) 152/78   Pulse 73   Ht 5\' 2"  (1.575 m)   Wt 118 lb (53.5 kg)   SpO2 96%   BMI 21.58 kg/m?    ?Wt Readings from Last 3 Encounters:  ?04/05/21 118 lb (53.5 kg)  ?09/19/20 121 lb (54.9 kg)  ?08/08/20 119 lb 12.8 oz (54.3 kg)  ?  ?General: Well nourished, well developed, in no acute distress ?Head: Atraumatic, normal size  ?Eyes: PEERLA, EOMI  ?Neck: Supple, no JVD ?Endocrine: No thryomegaly ?Cardiac: Normal S1, S2; RRR; no murmurs, rubs, or gallops ?Lungs: Clear to auscultation bilaterally, no wheezing, rhonchi or rales  ?Abd: Soft, nontender, no hepatomegaly  ?Ext: No edema, pulses 2+ ?Musculoskeletal: No deformities, BUE and BLE strength normal and equal ?Skin: Warm and dry, no rashes   ?Neuro: Alert and oriented to person, place, time, and situation, CNII-XII grossly intact, no focal deficits  ?Psych: Normal mood and affect  ? ?ASSESSMENT:   ?Kathleen Santana is a 88 y.o. female who presents for the following: ?1. Paroxysmal atrial fibrillation (HCC)   ?2. Atrial tachycardia (HCC)   ?3. Acquired thrombophilia (HCC)   ?4. Primary hypertension   ? ? ?PLAN:   ?1. Paroxysmal atrial fibrillation (HCC) ?2. Atrial tachycardia (HCC) ?3. Acquired thrombophilia (HCC) ?-Has been on and off from Adderall as well as amiodarone.  Seems to be doing well on no medication.  No recurrence of atrial fibrillation.  For now we will just continue metoprolol succinate 25 mg daily. ?-Currently on Eliquis 2.5 mg twice daily.  Weight less than 60 kg and age over [age].  We will continue this dose.  No bleeding issues. ? ?4. Primary hypertension ?-Well-controlled on lisinopril 10 mg daily.  Amlodipine 2.5 mg daily.  Metoprolol succinate 25 mg  daily. ? ? ?  ? ?Disposition: Return in about 6 months (around 10/06/2021). ? ?Medication Adjustments/Labs and Tests Ordered: ?Current medicines are reviewed at length with the patient today.  Concerns regarding medicines are outlined above.  ?No orders of the defined types were placed in this encounter. ? ?No orders of the defined types were placed in this encounter. ? ? ?Patient Instructions  ?Medication Instructions:  ?The current medical regimen is effective;  continue present plan and medications. ? ?*If you need a refill on your cardiac medications before your next appointment, please call your pharmacy* ? ?Follow-Up: ?At [facility], you and your health  needs are our priority.  As part of our continuing mission to provide you with exceptional heart care, we have created designated Provider Care Teams.  These Care Teams include your primary Cardiologist (physician) and Advanced Practice Providers (APPs -  Physician Assistants and Nurse Practitioners) who all work together to provide you with the care you need, when you need it. ? ?We recommend signing up for the patient portal called "MyChart".  Sign up information is provided on this After Visit Summary.  MyChart is used to connect with patients for Virtual Visits (Telemedicine).  Patients are able to view lab/test results, encounter notes, upcoming appointments, etc.  Non-urgent messages can be sent to your provider as well.   ?To learn more about what you can do with MyChart, go to [email or web address].   ? ?Your next appointment:   ?6 month(s) ? ?The format for your next appointment:   ?In Person ? ?Provider:   ?Lennie Odor, MD  ? ? ?  ? ?Time Spent with Patient: I have spent a total of 25 minutes with patient reviewing hospital notes, telemetry, EKGs, labs and examining the patient as well as establishing an assessment and plan that was discussed with the patient.  > 50% of time was spent in direct patient care. ? ?Signed, ?Gerri Spore T. Flora Lipps, MD,  [facility] ?[place]  [facility]  ?[address], [address] ?[place], [place] [address] ?[phone]  ?04/05/2021 4:08 PM    ? ?

## 2021-04-05 ENCOUNTER — Other Ambulatory Visit: Payer: Self-pay

## 2021-04-05 ENCOUNTER — Encounter: Payer: Self-pay | Admitting: Cardiovascular Disease

## 2021-04-05 ENCOUNTER — Ambulatory Visit: Payer: Medicare HMO | Admitting: Cardiovascular Disease

## 2021-04-05 VITALS — BP 152/78 | HR 73 | Ht 62.0 in | Wt 118.0 lb

## 2021-04-05 DIAGNOSIS — I471 Supraventricular tachycardia: Secondary | ICD-10-CM | POA: Diagnosis not present

## 2021-04-05 DIAGNOSIS — I480 Paroxysmal atrial fibrillation: Secondary | ICD-10-CM

## 2021-04-05 DIAGNOSIS — I10 Essential (primary) hypertension: Secondary | ICD-10-CM | POA: Diagnosis not present

## 2021-04-05 DIAGNOSIS — D6869 Other thrombophilia: Secondary | ICD-10-CM

## 2021-04-05 NOTE — Patient Instructions (Signed)
Medication Instructions:  The current medical regimen is effective;  continue present plan and medications.  *If you need a refill on your cardiac medications before your next appointment, please call your pharmacy*   Follow-Up: At [facility], you and your health needs are our priority.  As part of our continuing mission to provide you with exceptional heart care, we have created designated Provider Care Teams.  These Care Teams include your primary Cardiologist (physician) and Advanced Practice Providers (APPs -  Physician Assistants and Nurse Practitioners) who all work together to provide you with the care you need, when you need it.  We recommend signing up for the patient portal called "MyChart".  Sign up information is provided on this After Visit Summary.  MyChart is used to connect with patients for Virtual Visits (Telemedicine).  Patients are able to view lab/test results, encounter notes, upcoming appointments, etc.  Non-urgent messages can be sent to your provider as well.   To learn more about what you can do with MyChart, go to [email or web address].    Your next appointment:   6 month(s)  The format for your next appointment:   In Person  Provider:   [place] O'Neal, MD      

## 2021-04-12 DIAGNOSIS — D225 Melanocytic nevi of trunk: Secondary | ICD-10-CM | POA: Diagnosis not present

## 2021-04-12 DIAGNOSIS — L820 Inflamed seborrheic keratosis: Secondary | ICD-10-CM | POA: Diagnosis not present

## 2021-04-12 DIAGNOSIS — L570 Actinic keratosis: Secondary | ICD-10-CM | POA: Diagnosis not present

## 2021-04-12 DIAGNOSIS — L814 Other melanin hyperpigmentation: Secondary | ICD-10-CM | POA: Diagnosis not present

## 2021-04-12 DIAGNOSIS — L578 Other skin changes due to chronic exposure to nonionizing radiation: Secondary | ICD-10-CM | POA: Diagnosis not present

## 2021-05-24 DIAGNOSIS — Z008 Encounter for other general examination: Secondary | ICD-10-CM | POA: Diagnosis not present

## 2021-06-13 DIAGNOSIS — G629 Polyneuropathy, unspecified: Secondary | ICD-10-CM | POA: Diagnosis not present

## 2021-06-13 DIAGNOSIS — R252 Cramp and spasm: Secondary | ICD-10-CM | POA: Diagnosis not present

## 2021-06-13 DIAGNOSIS — I480 Paroxysmal atrial fibrillation: Secondary | ICD-10-CM | POA: Diagnosis not present

## 2021-07-10 DIAGNOSIS — J3089 Other allergic rhinitis: Secondary | ICD-10-CM | POA: Diagnosis not present

## 2021-07-10 DIAGNOSIS — Z0000 Encounter for general adult medical examination without abnormal findings: Secondary | ICD-10-CM | POA: Diagnosis not present

## 2021-07-10 DIAGNOSIS — M792 Neuralgia and neuritis, unspecified: Secondary | ICD-10-CM | POA: Diagnosis not present

## 2021-07-10 DIAGNOSIS — E559 Vitamin D deficiency, unspecified: Secondary | ICD-10-CM | POA: Diagnosis not present

## 2021-07-10 DIAGNOSIS — I10 Essential (primary) hypertension: Secondary | ICD-10-CM | POA: Diagnosis not present

## 2021-07-10 DIAGNOSIS — I480 Paroxysmal atrial fibrillation: Secondary | ICD-10-CM | POA: Diagnosis not present

## 2021-07-10 DIAGNOSIS — R143 Flatulence: Secondary | ICD-10-CM | POA: Diagnosis not present

## 2021-07-10 DIAGNOSIS — E039 Hypothyroidism, unspecified: Secondary | ICD-10-CM | POA: Diagnosis not present

## 2021-07-10 DIAGNOSIS — M47816 Spondylosis without myelopathy or radiculopathy, lumbar region: Secondary | ICD-10-CM | POA: Diagnosis not present

## 2021-07-10 DIAGNOSIS — E538 Deficiency of other specified B group vitamins: Secondary | ICD-10-CM | POA: Diagnosis not present

## 2021-07-10 DIAGNOSIS — Z79899 Other long term (current) drug therapy: Secondary | ICD-10-CM | POA: Diagnosis not present

## 2021-07-10 DIAGNOSIS — K219 Gastro-esophageal reflux disease without esophagitis: Secondary | ICD-10-CM | POA: Diagnosis not present

## 2021-07-29 ENCOUNTER — Other Ambulatory Visit: Payer: Self-pay | Admitting: Cardiovascular Disease

## 2021-07-30 NOTE — Telephone Encounter (Signed)
Prescription refill request for Eliquis received. Indication:Afib Last office visit:3/23 Scr:1.0 Age: 88 Weight:53.5 kg  Prescription refilled

## 2021-08-13 DIAGNOSIS — Z780 Asymptomatic menopausal state: Secondary | ICD-10-CM | POA: Diagnosis not present

## 2021-08-13 DIAGNOSIS — Z1231 Encounter for screening mammogram for malignant neoplasm of breast: Secondary | ICD-10-CM | POA: Diagnosis not present

## 2021-08-13 DIAGNOSIS — M810 Age-related osteoporosis without current pathological fracture: Secondary | ICD-10-CM | POA: Diagnosis not present

## 2021-08-17 DIAGNOSIS — Z01419 Encounter for gynecological examination (general) (routine) without abnormal findings: Secondary | ICD-10-CM | POA: Diagnosis not present

## 2021-09-28 DIAGNOSIS — H04123 Dry eye syndrome of bilateral lacrimal glands: Secondary | ICD-10-CM | POA: Diagnosis not present

## 2021-09-28 DIAGNOSIS — H02831 Dermatochalasis of right upper eyelid: Secondary | ICD-10-CM | POA: Diagnosis not present

## 2021-09-28 DIAGNOSIS — H353111 Nonexudative age-related macular degeneration, right eye, early dry stage: Secondary | ICD-10-CM | POA: Diagnosis not present

## 2021-09-28 DIAGNOSIS — H353221 Exudative age-related macular degeneration, left eye, with active choroidal neovascularization: Secondary | ICD-10-CM | POA: Diagnosis not present

## 2021-09-28 DIAGNOSIS — H02834 Dermatochalasis of left upper eyelid: Secondary | ICD-10-CM | POA: Diagnosis not present

## 2021-09-28 DIAGNOSIS — Z961 Presence of intraocular lens: Secondary | ICD-10-CM | POA: Diagnosis not present

## 2021-09-28 DIAGNOSIS — H527 Unspecified disorder of refraction: Secondary | ICD-10-CM | POA: Diagnosis not present

## 2021-09-28 DIAGNOSIS — H43813 Vitreous degeneration, bilateral: Secondary | ICD-10-CM | POA: Diagnosis not present

## 2021-10-01 DIAGNOSIS — J300 Vasomotor rhinitis: Secondary | ICD-10-CM | POA: Diagnosis not present

## 2021-10-04 DIAGNOSIS — H353221 Exudative age-related macular degeneration, left eye, with active choroidal neovascularization: Secondary | ICD-10-CM | POA: Diagnosis not present

## 2021-10-04 DIAGNOSIS — H43813 Vitreous degeneration, bilateral: Secondary | ICD-10-CM | POA: Diagnosis not present

## 2021-10-04 DIAGNOSIS — H3540 Unspecified peripheral retinal degeneration: Secondary | ICD-10-CM | POA: Diagnosis not present

## 2021-10-04 DIAGNOSIS — H353112 Nonexudative age-related macular degeneration, right eye, intermediate dry stage: Secondary | ICD-10-CM | POA: Diagnosis not present

## 2021-10-17 DIAGNOSIS — M65331 Trigger finger, right middle finger: Secondary | ICD-10-CM | POA: Diagnosis not present

## 2021-11-01 DIAGNOSIS — H353112 Nonexudative age-related macular degeneration, right eye, intermediate dry stage: Secondary | ICD-10-CM | POA: Diagnosis not present

## 2021-11-01 DIAGNOSIS — H353221 Exudative age-related macular degeneration, left eye, with active choroidal neovascularization: Secondary | ICD-10-CM | POA: Diagnosis not present

## 2021-11-12 NOTE — Progress Notes (Unsigned)
Cardiology Office Note:   Date:  11/13/2021  NAME:  Kathleen Santana    MRN: [id number] DOB:  28-Feb-1926   PCP:  Wayland Salinas, MD  Cardiologist:  None  Electrophysiologist:  None   Referring MD: Wayland Salinas, *   Chief Complaint  Patient presents with   Follow-up        History of Present Illness:   Kathleen Santana is a 88 y.o. female with a hx of pAF, HTN, HLD who presents for follow-up.  She presents for follow-up.  Doing well.  No further recurrence of atrial fibrillation.  Blood pressure well controlled 122/90.  Denies any chest pain or trouble breathing.  On Eliquis without any bleeding issues.  Overall doing quite well.  Problem List 1. Paroxysmal Afib -Afib with RVR 04/06/2020 -4% Afib burden -CHADSVASC=4 (age, sex, HTN) 2. Atrial tachycardia/PACs -6.3% PAC burden 3. HTN 4. HLD 5. Hypothyroidism   Past Medical History: Past Medical History:  Diagnosis Date   Afib ([place])    Arrhythmia    GERD (gastroesophageal reflux disease)    Hypertension    Hypothyroidism    Lower extremity neuropathy    Osteoporosis    Rhinitis, non-allergic     Past Surgical History: Past Surgical History:  Procedure Laterality Date   BACK SURGERY     NOSE SURGERY      Current Medications: Current Meds  Medication Sig   amLODipine (NORVASC) 2.5 MG tablet Take 2.5 mg by mouth daily.   apixaban (ELIQUIS) 2.5 MG TABS tablet Take 2.5 mg by mouth in the morning and at bedtime.   Cholecalciferol (VITAMIN D-3 PO) Take 1 capsule by mouth daily with breakfast.   gabapentin (NEURONTIN) 300 MG capsule Take 300 mg by mouth in the morning, at noon, in the evening, and at bedtime.   ipratropium (ATROVENT) 0.03 % nasal spray Place 1-3 sprays into both nostrils in the morning and at bedtime.   levothyroxine (SYNTHROID) 50 MCG tablet Take 50 mcg by mouth daily before breakfast.   lisinopril (ZESTRIL) 10 MG tablet TAKE ONE TABLET BY MOUTH EVERY MORNING AND EVERY EVENING    metoprolol succinate (TOPROL-XL) 25 MG 24 hr tablet TAKE ONE TABLET BY MOUTH DAILY   Multiple Vitamins-Minerals (ONE-A-DAY WOMENS 50+ ADVANTAGE) TABS Take 1 tablet by mouth daily with breakfast.   omeprazole (PRILOSEC) 20 MG capsule Take 20 mg by mouth daily as needed (before eating spicy/hot foods).      Allergies:    Nickel and Other   Social History: Social History   Socioeconomic History   Marital status: Widowed    Spouse name: Not on file   Number of children: 2   Years of education: Not on file   Highest education level: Not on file  Occupational History   Occupation: retired  Tobacco Use   Smoking status: Former    Years: 16.00    Types: Cigarettes   Smokeless tobacco: Never  Substance and Sexual Activity   Alcohol use: Never   Drug use: Never   Sexual activity: Not on file  Other Topics Concern   Not on file  Social History Narrative   Not on file   Social Determinants of Health   Financial Resource Strain: Not on file  Food Insecurity: Not on file  Transportation Needs: Not on file  Physical Activity: Not on file  Stress: Not on file  Social Connections: Not on file     Family History: The patient's family history includes Arrhythmia  in her mother; Heart disease in her father.  ROS:   All other ROS reviewed and negative. Pertinent positives noted in the HPI.     EKGs/Labs/Other Studies Reviewed:   The following studies were personally reviewed by me today:  Recent Labs: No results found for requested labs within last 365 days.   Recent Lipid Panel No results found for: "CHOL", "TRIG", "HDL", "CHOLHDL", "VLDL", "LDLCALC", "LDLDIRECT"  Physical Exam:   VS:  BP (!) 122/90   Pulse 69   Ht 5\' 2"  (1.575 m)   Wt 114 lb 12.8 oz (52.1 kg)   SpO2 99%   BMI 21.00 kg/m    Wt Readings from Last 3 Encounters:  11/13/21 114 lb 12.8 oz (52.1 kg)  04/05/21 118 lb (53.5 kg)  09/19/20 121 lb (54.9 kg)    General: Well nourished, well developed, in no  acute distress Head: Atraumatic, normal size  Eyes: PEERLA, EOMI  Neck: Supple, no JVD Endocrine: No thryomegaly Cardiac: Normal S1, S2; RRR; no murmurs, rubs, or gallops Lungs: Rhonchi bilaterally Abd: Soft, nontender, no hepatomegaly  Ext: No edema, pulses 2+ Musculoskeletal: No deformities, BUE and BLE strength normal and equal Skin: Warm and dry, no rashes   Neuro: Alert and oriented to person, place, time, and situation, CNII-XII grossly intact, no focal deficits  Psych: Normal mood and affect   ASSESSMENT:   Kathleen Santana is a 88 y.o. female who presents for the following: 1. Paroxysmal atrial fibrillation (HCC)   2. Atrial tachycardia   3. Acquired thrombophilia ([place])   4. Primary hypertension     PLAN:   1. Paroxysmal atrial fibrillation (HCC) 2. Atrial tachycardia 3. Acquired thrombophilia ([place]) -No recurrence of atrial patient.  Doing well.  Continue metoprolol succinate 25 mg daily.  Still walking at least 1 mile per day.  No issues.  Continue Eliquis 2.5 mg twice daily.  4. Primary hypertension -Well-controlled on amlodipine 2.5 mg daily, lisinopril 10 mg daily and metoprolol succinate 25 mg daily.  Disposition: Return in about 6 months (around 05/14/2022).  Medication Adjustments/Labs and Tests Ordered: Current medicines are reviewed at length with the patient today.  Concerns regarding medicines are outlined above.  No orders of the defined types were placed in this encounter.  No orders of the defined types were placed in this encounter.   Patient Instructions  Medication Instructions:  The current medical regimen is effective;  continue present plan and medications.  *If you need a refill on your cardiac medications before your next appointment, please call your pharmacy*   Follow-Up: At [facility], you and your health needs are our priority.  As part of our continuing mission to provide you with exceptional heart care, we have created  designated Provider Care Teams.  These Care Teams include your primary Cardiologist (physician) and Advanced Practice Providers (APPs -  Physician Assistants and Nurse Practitioners) who all work together to provide you with the care you need, when you need it.  We recommend signing up for the patient portal called "MyChart".  Sign up information is provided on this After Visit Summary.  MyChart is used to connect with patients for Virtual Visits (Telemedicine).  Patients are able to view lab/test results, encounter notes, upcoming appointments, etc.  Non-urgent messages can be sent to your provider as well.   To learn more about what you can do with MyChart, go to [email or web address].    Your next appointment:   6 month(s)  The format for your  next appointment:   In Person  Provider:   Eleonore Chiquito, MD or Sande Rives, PA-C          Time Spent with Patient: I have spent a total of 25 minutes with patient reviewing hospital notes, telemetry, EKGs, labs and examining the patient as well as establishing an assessment and plan that was discussed with the patient.  > 50% of time was spent in direct patient care.  Signed, Addison Naegeli. Audie Box, MD, [place]  [address] [place], [place] [address] [phone]  11/13/2021 3:04 PM

## 2021-11-13 ENCOUNTER — Ambulatory Visit: Payer: Medicare HMO | Attending: Cardiovascular Disease | Admitting: Cardiovascular Disease

## 2021-11-13 ENCOUNTER — Encounter: Payer: Self-pay | Admitting: Cardiovascular Disease

## 2021-11-13 VITALS — BP 122/90 | HR 69 | Ht 62.0 in | Wt 114.8 lb

## 2021-11-13 DIAGNOSIS — I480 Paroxysmal atrial fibrillation: Secondary | ICD-10-CM

## 2021-11-13 DIAGNOSIS — D6869 Other thrombophilia: Secondary | ICD-10-CM

## 2021-11-13 DIAGNOSIS — I10 Essential (primary) hypertension: Secondary | ICD-10-CM

## 2021-11-13 DIAGNOSIS — I4719 Other supraventricular tachycardia: Secondary | ICD-10-CM

## 2021-11-13 NOTE — Patient Instructions (Signed)
Medication Instructions:  The current medical regimen is effective;  continue present plan and medications.  *If you need a refill on your cardiac medications before your next appointment, please call your pharmacy*   Follow-Up: At [facility], you and your health needs are our priority.  As part of our continuing mission to provide you with exceptional heart care, we have created designated Provider Care Teams.  These Care Teams include your primary Cardiologist (physician) and Advanced Practice Providers (APPs -  Physician Assistants and Nurse Practitioners) who all work together to provide you with the care you need, when you need it.  We recommend signing up for the patient portal called "MyChart".  Sign up information is provided on this After Visit Summary.  MyChart is used to connect with patients for Virtual Visits (Telemedicine).  Patients are able to view lab/test results, encounter notes, upcoming appointments, etc.  Non-urgent messages can be sent to your provider as well.   To learn more about what you can do with MyChart, go to [email or web address].    Your next appointment:   6 month(s)  The format for your next appointment:   In Person  Provider:   Eleonore Chiquito, MD or Sande Rives, PA-C

## 2021-11-29 DIAGNOSIS — H43813 Vitreous degeneration, bilateral: Secondary | ICD-10-CM | POA: Diagnosis not present

## 2021-11-29 DIAGNOSIS — H353221 Exudative age-related macular degeneration, left eye, with active choroidal neovascularization: Secondary | ICD-10-CM | POA: Diagnosis not present

## 2021-11-29 DIAGNOSIS — H3540 Unspecified peripheral retinal degeneration: Secondary | ICD-10-CM | POA: Diagnosis not present

## 2021-11-29 DIAGNOSIS — H353111 Nonexudative age-related macular degeneration, right eye, early dry stage: Secondary | ICD-10-CM | POA: Diagnosis not present

## 2021-12-11 DIAGNOSIS — G629 Polyneuropathy, unspecified: Secondary | ICD-10-CM | POA: Diagnosis not present

## 2021-12-11 DIAGNOSIS — I480 Paroxysmal atrial fibrillation: Secondary | ICD-10-CM | POA: Diagnosis not present

## 2021-12-11 DIAGNOSIS — M5432 Sciatica, left side: Secondary | ICD-10-CM | POA: Diagnosis not present

## 2021-12-11 DIAGNOSIS — R202 Paresthesia of skin: Secondary | ICD-10-CM | POA: Diagnosis not present

## 2021-12-12 DIAGNOSIS — M5432 Sciatica, left side: Secondary | ICD-10-CM | POA: Diagnosis not present

## 2021-12-24 ENCOUNTER — Other Ambulatory Visit: Payer: Self-pay | Admitting: Cardiovascular Disease

## 2021-12-26 DIAGNOSIS — R293 Abnormal posture: Secondary | ICD-10-CM | POA: Diagnosis not present

## 2021-12-26 DIAGNOSIS — R2689 Other abnormalities of gait and mobility: Secondary | ICD-10-CM | POA: Diagnosis not present

## 2021-12-26 DIAGNOSIS — Z723 Lack of physical exercise: Secondary | ICD-10-CM | POA: Diagnosis not present

## 2021-12-26 DIAGNOSIS — M79605 Pain in left leg: Secondary | ICD-10-CM | POA: Diagnosis not present

## 2021-12-26 DIAGNOSIS — M7918 Myalgia, other site: Secondary | ICD-10-CM | POA: Diagnosis not present

## 2022-01-03 DIAGNOSIS — Z723 Lack of physical exercise: Secondary | ICD-10-CM | POA: Diagnosis not present

## 2022-01-03 DIAGNOSIS — R293 Abnormal posture: Secondary | ICD-10-CM | POA: Diagnosis not present

## 2022-01-03 DIAGNOSIS — R2689 Other abnormalities of gait and mobility: Secondary | ICD-10-CM | POA: Diagnosis not present

## 2022-01-03 DIAGNOSIS — M79605 Pain in left leg: Secondary | ICD-10-CM | POA: Diagnosis not present

## 2022-01-03 DIAGNOSIS — M7918 Myalgia, other site: Secondary | ICD-10-CM | POA: Diagnosis not present

## 2022-01-09 DIAGNOSIS — S62303A Unspecified fracture of third metacarpal bone, left hand, initial encounter for closed fracture: Secondary | ICD-10-CM | POA: Diagnosis not present

## 2022-01-09 DIAGNOSIS — R293 Abnormal posture: Secondary | ICD-10-CM | POA: Diagnosis not present

## 2022-01-09 DIAGNOSIS — S62307A Unspecified fracture of fifth metacarpal bone, left hand, initial encounter for closed fracture: Secondary | ICD-10-CM | POA: Diagnosis not present

## 2022-01-09 DIAGNOSIS — S62323A Displaced fracture of shaft of third metacarpal bone, left hand, initial encounter for closed fracture: Secondary | ICD-10-CM | POA: Diagnosis not present

## 2022-01-09 DIAGNOSIS — S62321A Displaced fracture of shaft of second metacarpal bone, left hand, initial encounter for closed fracture: Secondary | ICD-10-CM | POA: Diagnosis not present

## 2022-01-09 DIAGNOSIS — R2689 Other abnormalities of gait and mobility: Secondary | ICD-10-CM | POA: Diagnosis not present

## 2022-01-09 DIAGNOSIS — M7918 Myalgia, other site: Secondary | ICD-10-CM | POA: Diagnosis not present

## 2022-01-09 DIAGNOSIS — M79605 Pain in left leg: Secondary | ICD-10-CM | POA: Diagnosis not present

## 2022-01-09 DIAGNOSIS — Z723 Lack of physical exercise: Secondary | ICD-10-CM | POA: Diagnosis not present

## 2022-01-10 ENCOUNTER — Other Ambulatory Visit: Payer: Self-pay

## 2022-01-10 ENCOUNTER — Emergency Department (HOSPITAL_COMMUNITY)
Admission: EM | Admit: 2022-01-10 | Discharge: 2022-01-10 | Disposition: A | Discharge status: Home / Self Care | Payer: Medicare HMO | Attending: Emergency Medicine | Admitting: Emergency Medicine

## 2022-01-10 ENCOUNTER — Emergency Department (HOSPITAL_COMMUNITY): Payer: Medicare HMO

## 2022-01-10 DIAGNOSIS — N289 Disorder of kidney and ureter, unspecified: Secondary | ICD-10-CM | POA: Diagnosis not present

## 2022-01-10 DIAGNOSIS — Z79899 Other long term (current) drug therapy: Secondary | ICD-10-CM | POA: Insufficient documentation

## 2022-01-10 DIAGNOSIS — S62303A Unspecified fracture of third metacarpal bone, left hand, initial encounter for closed fracture: Secondary | ICD-10-CM | POA: Diagnosis not present

## 2022-01-10 DIAGNOSIS — I10 Essential (primary) hypertension: Secondary | ICD-10-CM | POA: Diagnosis not present

## 2022-01-10 DIAGNOSIS — S62307A Unspecified fracture of fifth metacarpal bone, left hand, initial encounter for closed fracture: Secondary | ICD-10-CM | POA: Diagnosis not present

## 2022-01-10 DIAGNOSIS — E039 Hypothyroidism, unspecified: Secondary | ICD-10-CM | POA: Diagnosis not present

## 2022-01-10 DIAGNOSIS — R0789 Other chest pain: Secondary | ICD-10-CM | POA: Diagnosis not present

## 2022-01-10 DIAGNOSIS — R0902 Hypoxemia: Secondary | ICD-10-CM | POA: Diagnosis not present

## 2022-01-10 DIAGNOSIS — S62321A Displaced fracture of shaft of second metacarpal bone, left hand, initial encounter for closed fracture: Secondary | ICD-10-CM | POA: Diagnosis not present

## 2022-01-10 DIAGNOSIS — R55 Syncope and collapse: Secondary | ICD-10-CM | POA: Diagnosis not present

## 2022-01-10 DIAGNOSIS — S62323A Displaced fracture of shaft of third metacarpal bone, left hand, initial encounter for closed fracture: Secondary | ICD-10-CM | POA: Diagnosis not present

## 2022-01-10 DIAGNOSIS — Z7901 Long term (current) use of anticoagulants: Secondary | ICD-10-CM | POA: Diagnosis not present

## 2022-01-10 DIAGNOSIS — D72829 Elevated white blood cell count, unspecified: Secondary | ICD-10-CM | POA: Diagnosis not present

## 2022-01-10 DIAGNOSIS — I959 Hypotension, unspecified: Secondary | ICD-10-CM | POA: Diagnosis not present

## 2022-01-10 DIAGNOSIS — Z743 Need for continuous supervision: Secondary | ICD-10-CM | POA: Diagnosis not present

## 2022-01-10 DIAGNOSIS — M79642 Pain in left hand: Secondary | ICD-10-CM | POA: Diagnosis not present

## 2022-01-10 LAB — CBC
HCT: 33.4 % — ABNORMAL LOW (ref 36.0–46.0)
Hemoglobin: 10.9 g/dL — ABNORMAL LOW (ref 12.0–15.0)
MCH: 31.4 pg (ref 26.0–34.0)
MCHC: 32.6 g/dL (ref 30.0–36.0)
MCV: 96.3 fL (ref 80.0–100.0)
Platelets: 263 10*3/uL (ref 150–400)
RBC: 3.47 MIL/uL — ABNORMAL LOW (ref 3.87–5.11)
RDW: 14.9 % (ref 11.5–15.5)
WBC: 11.6 10*3/uL — ABNORMAL HIGH (ref 4.0–10.5)
nRBC: 0 % (ref 0.0–0.2)

## 2022-01-10 LAB — URINALYSIS, ROUTINE W REFLEX MICROSCOPIC
Bilirubin Urine: NEGATIVE
Glucose, UA: NEGATIVE mg/dL
Hgb urine dipstick: NEGATIVE
Ketones, ur: NEGATIVE mg/dL
Nitrite: NEGATIVE
Protein, ur: NEGATIVE mg/dL
Specific Gravity, Urine: 1.028 (ref 1.005–1.030)
pH: 8 (ref 5.0–8.0)

## 2022-01-10 LAB — BASIC METABOLIC PANEL
Anion gap: 9 (ref 5–15)
BUN: 23 mg/dL (ref 8–23)
CO2: 21 mmol/L — ABNORMAL LOW (ref 22–32)
Calcium: 8.9 mg/dL (ref 8.9–10.3)
Chloride: 105 mmol/L (ref 98–111)
Creatinine, Ser: 1.33 mg/dL — ABNORMAL HIGH (ref 0.44–1.00)
GFR, Estimated: 37 mL/min — ABNORMAL LOW (ref >60)
Glucose, Bld: 131 mg/dL — ABNORMAL HIGH (ref 70–99)
Potassium: 4.1 mmol/L (ref 3.5–5.1)
Sodium: 135 mmol/L (ref 135–145)

## 2022-01-10 LAB — I-STAT CHEM 8, ED
BUN: 27 mg/dL — ABNORMAL HIGH (ref 8–23)
Calcium, Ion: 1.01 mmol/L — ABNORMAL LOW (ref 1.15–1.40)
Chloride: 104 mmol/L (ref 98–111)
Creatinine, Ser: 1.2 mg/dL — ABNORMAL HIGH (ref 0.44–1.00)
Glucose, Bld: 128 mg/dL — ABNORMAL HIGH (ref 70–99)
HCT: 32 % — ABNORMAL LOW (ref 36.0–46.0)
Hemoglobin: 10.9 g/dL — ABNORMAL LOW (ref 12.0–15.0)
Potassium: 4.1 mmol/L (ref 3.5–5.1)
Sodium: 136 mmol/L (ref 135–145)
TCO2: 23 mmol/L (ref 22–32)

## 2022-01-10 LAB — TROPONIN I (HIGH SENSITIVITY)
Troponin I (High Sensitivity): 15 ng/L (ref <18)
Troponin I (High Sensitivity): 15 ng/L (ref <18)

## 2022-01-10 MED ORDER — SODIUM CHLORIDE 0.9 % IV SOLN: Freq: Once | INTRAVENOUS | Status: AC

## 2022-01-10 MED ORDER — IOHEXOL 350 MG/ML SOLN
75.0000 mL | Freq: Once | INTRAVENOUS | Status: AC | PRN
  Administered 2022-01-10: 75 mL via INTRAVENOUS

## 2022-01-10 NOTE — Progress Notes (Signed)
Orthopedic Tech Progress Note Patient Details:  Kathleen Santana 03-31-1926 [id number]  Patient ID: [name], female   DOB: 1926-05-07, 88 y.o.   MRN: [id number] Level II; not currently needed. [name] 01/10/2022, 3:44 AM
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
## 2022-01-10 NOTE — ED Notes (Signed)
Ambulated pt in hall independently without assistive device

## 2022-01-11 ENCOUNTER — Other Ambulatory Visit: Payer: Self-pay

## 2022-01-11 ENCOUNTER — Encounter (HOSPITAL_COMMUNITY): Payer: Self-pay | Admitting: Orthopedic Surgery

## 2022-01-11 NOTE — Progress Notes (Signed)
PCP -  [facility] Cardiologist - Dr Stephenie Acres  PPM/ICD - Denies   Chest x-ray -  EKG - 01/10/2022 Stress Test -  ECHO -  02/29/2020 Cardiac Cath - Denies  Sleep Study - Denies   DM- Denies  Blood Thinner Instructions: Eliquis BID.  Per dr Melvyn Novas to stop tomorrow 01/12/2022 Aspirin Instructions: Stop unless otherwise indicated by MD.  ERAS Protcol - Yes   COVID TEST- N/A   Anesthesia review: Yes, cardiac history.  Notified James via secure chat  Patient denies shortness of breath, fever, cough and chest pain during phone interview.    All instructions explained to the patient, with a verbal understanding of the material. . The opportunity to ask questions was provided.

## 2022-01-11 NOTE — Progress Notes (Signed)
Per Dr Ginger Carne instruct pt to stop Eliquis tomorrow in preparation for her procedure scheduled on  Sunday 01/13/2022.
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
## 2022-01-13 ENCOUNTER — Encounter (HOSPITAL_COMMUNITY)
Admission: RE | Disposition: A | Discharge status: Home / Self Care | Payer: Self-pay | Source: Home / Self Care | Attending: Orthopedic Surgery

## 2022-01-13 ENCOUNTER — Ambulatory Visit (HOSPITAL_COMMUNITY): Payer: Medicare HMO

## 2022-01-13 ENCOUNTER — Ambulatory Visit (HOSPITAL_BASED_OUTPATIENT_CLINIC_OR_DEPARTMENT_OTHER): Payer: Medicare HMO | Admitting: Anesthesiology

## 2022-01-13 ENCOUNTER — Other Ambulatory Visit: Payer: Self-pay

## 2022-01-13 ENCOUNTER — Ambulatory Visit (HOSPITAL_COMMUNITY): Payer: Medicare HMO | Admitting: Anesthesiology

## 2022-01-13 ENCOUNTER — Ambulatory Visit (HOSPITAL_COMMUNITY)
Admission: RE | Admit: 2022-01-13 | Discharge: 2022-01-13 | Disposition: A | Discharge status: Home / Self Care | Payer: Medicare HMO | Attending: Orthopedic Surgery | Admitting: Orthopedic Surgery

## 2022-01-13 ENCOUNTER — Encounter (HOSPITAL_COMMUNITY): Payer: Self-pay | Admitting: Orthopedic Surgery

## 2022-01-13 DIAGNOSIS — Z87891 Personal history of nicotine dependence: Secondary | ICD-10-CM

## 2022-01-13 DIAGNOSIS — S62323A Displaced fracture of shaft of third metacarpal bone, left hand, initial encounter for closed fracture: Secondary | ICD-10-CM | POA: Diagnosis present

## 2022-01-13 DIAGNOSIS — I10 Essential (primary) hypertension: Secondary | ICD-10-CM | POA: Insufficient documentation

## 2022-01-13 DIAGNOSIS — K219 Gastro-esophageal reflux disease without esophagitis: Secondary | ICD-10-CM | POA: Insufficient documentation

## 2022-01-13 DIAGNOSIS — Z79899 Other long term (current) drug therapy: Secondary | ICD-10-CM | POA: Diagnosis not present

## 2022-01-13 DIAGNOSIS — S62303A Unspecified fracture of third metacarpal bone, left hand, initial encounter for closed fracture: Secondary | ICD-10-CM

## 2022-01-13 DIAGNOSIS — S62321A Displaced fracture of shaft of second metacarpal bone, left hand, initial encounter for closed fracture: Secondary | ICD-10-CM | POA: Insufficient documentation

## 2022-01-13 DIAGNOSIS — S62301A Unspecified fracture of second metacarpal bone, left hand, initial encounter for closed fracture: Secondary | ICD-10-CM | POA: Diagnosis not present

## 2022-01-13 DIAGNOSIS — E039 Hypothyroidism, unspecified: Secondary | ICD-10-CM | POA: Diagnosis not present

## 2022-01-13 DIAGNOSIS — M8000XA Age-related osteoporosis with current pathological fracture, unspecified site, initial encounter for fracture: Secondary | ICD-10-CM

## 2022-01-13 HISTORY — DX: COVID-19: U07.1

## 2022-01-13 HISTORY — DX: Pneumonia, unspecified organism: J18.9

## 2022-01-13 HISTORY — PX: OPEN REDUCTION INTERNAL FIXATION (ORIF) METACARPAL: SHX6234

## 2022-01-13 SURGERY — OPEN REDUCTION INTERNAL FIXATION (ORIF) METACARPAL
Anesthesia: Monitor Anesthesia Care | Site: Finger | Laterality: Left

## 2022-01-13 MED ORDER — PROPOFOL 10 MG/ML IV BOLUS
INTRAVENOUS | Status: DC | PRN
  Administered 2022-01-13: 30 mg via INTRAVENOUS

## 2022-01-13 MED ORDER — CEFAZOLIN SODIUM-DEXTROSE 2-4 GM/100ML-% IV SOLN
INTRAVENOUS | Status: AC
  Filled 2022-01-13: qty 100

## 2022-01-13 MED ORDER — 0.9 % SODIUM CHLORIDE (POUR BTL) OPTIME
TOPICAL | Status: DC | PRN
  Administered 2022-01-13: 1000 mL

## 2022-01-13 MED ORDER — CHLORHEXIDINE GLUCONATE 0.12 % MT SOLN: 15.0000 mL | Freq: Once | OROMUCOSAL | Status: AC

## 2022-01-13 MED ORDER — CEFAZOLIN SODIUM-DEXTROSE 2-4 GM/100ML-% IV SOLN
2.0000 g | INTRAVENOUS | Status: AC
  Administered 2022-01-13: 2 g via INTRAVENOUS

## 2022-01-13 MED ORDER — PROPOFOL 500 MG/50ML IV EMUL
INTRAVENOUS | Status: DC | PRN
  Administered 2022-01-13: 30 ug/kg/min via INTRAVENOUS

## 2022-01-13 MED ORDER — FENTANYL CITRATE (PF) 100 MCG/2ML IJ SOLN
INTRAMUSCULAR | Status: AC
  Filled 2022-01-13: qty 2

## 2022-01-13 MED ORDER — TRAMADOL HCL 50 MG PO TABS: 50.0000 mg | ORAL_TABLET | Freq: Four times a day (QID) | ORAL | 0 refills | Status: AC | PRN

## 2022-01-13 MED ORDER — LACTATED RINGERS IV SOLN: INTRAVENOUS | Status: DC

## 2022-01-13 MED ORDER — ORAL CARE MOUTH RINSE: 15.0000 mL | Freq: Once | OROMUCOSAL | Status: AC

## 2022-01-13 MED ORDER — PROPOFOL 1000 MG/100ML IV EMUL
INTRAVENOUS | Status: AC
  Filled 2022-01-13: qty 100

## 2022-01-13 MED ORDER — ROPIVACAINE HCL 5 MG/ML IJ SOLN
INTRAMUSCULAR | Status: DC | PRN
  Administered 2022-01-13: 20 mL via PERINEURAL

## 2022-01-13 MED ORDER — LIDOCAINE 2% (20 MG/ML) 5 ML SYRINGE
INTRAMUSCULAR | Status: DC | PRN
  Administered 2022-01-13: 40 mg via INTRAVENOUS

## 2022-01-13 MED ORDER — ONDANSETRON HCL 4 MG/2ML IJ SOLN
INTRAMUSCULAR | Status: DC | PRN
  Administered 2022-01-13: 4 mg via INTRAVENOUS

## 2022-01-13 MED ORDER — FENTANYL CITRATE (PF) 250 MCG/5ML IJ SOLN
INTRAMUSCULAR | Status: AC
  Filled 2022-01-13: qty 5

## 2022-01-13 MED ORDER — FENTANYL CITRATE PF 50 MCG/ML IJ SOSY
50.0000 ug | PREFILLED_SYRINGE | Freq: Once | INTRAMUSCULAR | Status: AC
  Administered 2022-01-13: 50 ug via INTRAVENOUS

## 2022-01-13 MED ORDER — LIDOCAINE 2% (20 MG/ML) 5 ML SYRINGE
INTRAMUSCULAR | Status: AC
  Filled 2022-01-13: qty 5

## 2022-01-13 MED ORDER — FENTANYL CITRATE (PF) 250 MCG/5ML IJ SOLN
INTRAMUSCULAR | Status: DC | PRN
  Administered 2022-01-13: 50 ug via INTRAVENOUS

## 2022-01-13 MED ORDER — CHLORHEXIDINE GLUCONATE 0.12 % MT SOLN
OROMUCOSAL | Status: AC
  Administered 2022-01-13: 15 mL via OROMUCOSAL
  Filled 2022-01-13: qty 15

## 2022-01-13 SURGICAL SUPPLY — 57 items
BAG COUNTER SPONGE SURGICOUNT (BAG) ×1 IMPLANT
BAG SPNG CNTER NS LX DISP (BAG) ×1
BIT DRILL 3.0X110 (BIT) IMPLANT
BLADE CLIPPER SURG (BLADE) IMPLANT
BNDG CMPR 9X4 STRL LF SNTH (GAUZE/BANDAGES/DRESSINGS) ×1
BNDG ELASTIC 3X5.8 VLCR STR LF (GAUZE/BANDAGES/DRESSINGS) ×1 IMPLANT
BNDG ELASTIC 4X5.8 VLCR STR LF (GAUZE/BANDAGES/DRESSINGS) ×1 IMPLANT
BNDG ESMARK 4X9 LF (GAUZE/BANDAGES/DRESSINGS) ×1 IMPLANT
BNDG GAUZE DERMACEA FLUFF 4 (GAUZE/BANDAGES/DRESSINGS) ×1 IMPLANT
BNDG GZE DERMACEA 4 6PLY (GAUZE/BANDAGES/DRESSINGS) ×1
CORD BIPOLAR FORCEPS 12FT (ELECTRODE) ×1 IMPLANT
COVER SURGICAL LIGHT HANDLE (MISCELLANEOUS) ×1 IMPLANT
CUFF TOURN SGL QUICK 18X4 (TOURNIQUET CUFF) ×1 IMPLANT
CUFF TOURN SGL QUICK 24 (TOURNIQUET CUFF)
CUFF TRNQT CYL 24X4X16.5-23 (TOURNIQUET CUFF) IMPLANT
DRAIN TLS ROUND 10FR (DRAIN) IMPLANT
DRAPE OEC MINIVIEW 54X84 (DRAPES) ×1 IMPLANT
DRAPE SURG 17X11 SM STRL (DRAPES) ×1 IMPLANT
DRSG ADAPTIC 3X8 NADH LF (GAUZE/BANDAGES/DRESSINGS) ×1 IMPLANT
GAUZE 4X4 16PLY ~~LOC~~+RFID DBL (SPONGE) IMPLANT
GAUZE SPONGE 4X4 12PLY STRL (GAUZE/BANDAGES/DRESSINGS) ×1 IMPLANT
GAUZE XEROFORM 1X8 LF (GAUZE/BANDAGES/DRESSINGS) IMPLANT
GLOVE BIOGEL PI IND STRL 8.5 (GLOVE) ×1 IMPLANT
GLOVE SURG ORTHO 8.0 STRL STRW (GLOVE) ×1 IMPLANT
GOWN STRL REUS W/ TWL LRG LVL3 (GOWN DISPOSABLE) ×3 IMPLANT
GOWN STRL REUS W/ TWL XL LVL3 (GOWN DISPOSABLE) ×1 IMPLANT
GOWN STRL REUS W/TWL LRG LVL3 (GOWN DISPOSABLE) ×3
GOWN STRL REUS W/TWL XL LVL3 (GOWN DISPOSABLE) ×1
K-WIRE FIXATION TRI 1.1X120 (WIRE) ×2
KIT BASIN OR (CUSTOM PROCEDURE TRAY) ×1 IMPLANT
KIT TURNOVER KIT B (KITS) ×1 IMPLANT
KWIRE FIXATION TRI 1.1X120 (WIRE) IMPLANT
MANIFOLD NEPTUNE II (INSTRUMENTS) ×1 IMPLANT
NAIL IM THRD 3X55 (Nail) IMPLANT
NDL HYPO 25X1 1.5 SAFETY (NEEDLE) ×1 IMPLANT
NEEDLE HYPO 25X1 1.5 SAFETY (NEEDLE) ×1 IMPLANT
NS IRRIG 1000ML POUR BTL (IV SOLUTION) ×1 IMPLANT
PACK ORTHO EXTREMITY (CUSTOM PROCEDURE TRAY) ×1 IMPLANT
PAD ARMBOARD 7.5X6 YLW CONV (MISCELLANEOUS) ×2 IMPLANT
PAD CAST 3X4 CTTN HI CHSV (CAST SUPPLIES) IMPLANT
PAD CAST 4YDX4 CTTN HI CHSV (CAST SUPPLIES) ×1 IMPLANT
PADDING CAST COTTON 3X4 STRL (CAST SUPPLIES) ×1
PADDING CAST COTTON 4X4 STRL (CAST SUPPLIES) ×1
SOAP 2 % CHG 4 OZ (WOUND CARE) ×1 IMPLANT
SPLINT FIBERGLASS 3X12 (CAST SUPPLIES) IMPLANT
STRIP CLOSURE SKIN 1/2X4 (GAUZE/BANDAGES/DRESSINGS) IMPLANT
SUT ETHILON 4 0 PS 2 18 (SUTURE) IMPLANT
SUT MNCRL AB 4-0 PS2 18 (SUTURE) IMPLANT
SUT VIC AB 2-0 FS1 27 (SUTURE) IMPLANT
SUT VICRYL 4-0 PS2 18IN ABS (SUTURE) IMPLANT
SYR CONTROL 10ML LL (SYRINGE) IMPLANT
SYSTEM CHEST DRAIN TLS 7FR (DRAIN) IMPLANT
TOWEL GREEN STERILE (TOWEL DISPOSABLE) ×1 IMPLANT
TOWEL GREEN STERILE FF (TOWEL DISPOSABLE) ×1 IMPLANT
TUBE CONNECTING 12X1/4 (SUCTIONS) ×1 IMPLANT
WATER STERILE IRR 1000ML POUR (IV SOLUTION) ×1 IMPLANT
YANKAUER SUCT BULB TIP NO VENT (SUCTIONS) IMPLANT
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
## 2022-01-13 NOTE — Transfer of Care (Signed)
Immediate Anesthesia Transfer of Care Note  Patient: Kathleen Santana  Procedure(s) Performed: OPEN REDUCTION INTERNAL FIXATION (ORIF) METACARPAL, LEFT INDEX AND LONG FINGER (Left: Finger)  Patient Location: PACU  Anesthesia Type:MAC and Regional  Level of Consciousness: awake, alert , and oriented  Airway & Oxygen Therapy: Patient Spontanous Breathing  Post-op Assessment: Report given to RN and Post -op Vital signs reviewed and stable  Post vital signs: Reviewed and stable  Last Vitals:  Vitals Value Taken Time  BP 115/62 01/13/22 1546  Temp    Pulse 64 01/13/22 1550  Resp 11 01/13/22 1550  SpO2 100 % 01/13/22 1550  Vitals shown include unvalidated device data.  Last Pain:  Vitals:   01/13/22 1028  TempSrc: Oral  PainSc: 0-No pain         Complications: No notable events documented.

## 2022-01-13 NOTE — Anesthesia Procedure Notes (Signed)
Anesthesia Regional Block: Supraclavicular block   Pre-Anesthetic Checklist: , timeout performed,  Correct Patient, Correct Site, Correct Laterality,  Correct Procedure, Correct Position, site marked,  Risks and benefits discussed,  Surgical consent,  Pre-op evaluation,  At surgeon's request and post-op pain management  Laterality: Left  Prep: Dura Prep       Needles:  Injection technique: Single-shot  Needle Type: Echogenic Stimulator Needle     Needle Length: 5cm  Needle Gauge: 20     Additional Needles:   Procedures:,,,, ultrasound used (permanent image in chart),,    Narrative:  Start time: 01/13/2022 12:40 PM End time: 01/13/2022 12:43 PM Injection made incrementally with aspirations every 5 mL.  Performed by: Personally  Anesthesiologist: Atilano Median, DO  Additional Notes: Patient identified. Risks/Benefits/Options discussed with patient including but not limited to bleeding, infection, nerve damage, failed block, incomplete pain control. Patient expressed understanding and wished to proceed. All questions were answered. Sterile technique was used throughout the entire procedure. Please see nursing notes for vital signs. Aspirated in 5cc intervals with injection for negative confirmation. Patient was given instructions on fall risk and not to get out of bed. All questions and concerns addressed with instructions to call with any issues or inadequate analgesia.

## 2022-01-13 NOTE — Discharge Instructions (Signed)
KEEP BANDAGE CLEAN AND DRY °CALL OFFICE FOR F/U APPT [phone] °Dr Antaniya Venuti [phone] °KEEP HAND ELEVATED ABOVE HEART °OK TO APPLY ICE TO OPERATIVE AREA °CONTACT OFFICE IF ANY WORSENING PAIN OR CONCERNS. °

## 2022-01-13 NOTE — Anesthesia Postprocedure Evaluation (Signed)
Anesthesia Post Note  Patient: Kathleen Santana  Procedure(s) Performed: OPEN REDUCTION INTERNAL FIXATION (ORIF) METACARPAL, LEFT INDEX AND LONG FINGER (Left: Finger)     Patient location during evaluation: PACU Anesthesia Type: Regional and MAC Level of consciousness: awake and alert Pain management: pain level controlled Vital Signs Assessment: post-procedure vital signs reviewed and stable Respiratory status: spontaneous breathing, nonlabored ventilation, respiratory function stable and patient connected to nasal cannula oxygen Cardiovascular status: stable and blood pressure returned to baseline Postop Assessment: no apparent nausea or vomiting Anesthetic complications: no   No notable events documented.  Last Vitals:  Vitals:   01/13/22 1645 01/13/22 1700  BP: (!) 157/75 (!) 142/71  Pulse: (!) 55 67  Resp: 10 17  Temp:  36.4 C  SpO2: 98% 96%    Last Pain:  Vitals:   01/13/22 1700  TempSrc:   PainSc: 0-No pain                 Belenda Cruise P Khylie Larmore

## 2022-01-13 NOTE — Anesthesia Procedure Notes (Signed)
Procedure Name: MAC Date/Time: 01/13/2022 2:45 PM  Performed by: Trinna Post., CRNAPre-anesthesia Checklist: Patient identified, Emergency Drugs available and Timeout performed Patient Re-evaluated:Patient Re-evaluated prior to induction Oxygen Delivery Method: Simple face mask Preoxygenation: Pre-oxygenation with 100% oxygen Induction Type: IV induction Placement Confirmation: positive ETCO2

## 2022-01-13 NOTE — Anesthesia Preprocedure Evaluation (Addendum)
Anesthesia Evaluation  Patient identified by MRN, date of birth, ID band Patient awake    Reviewed: Allergy & Precautions, NPO status , Patient's Chart, lab work & pertinent test results  Airway Mallampati: II  TM Distance: >3 FB Neck ROM: Full    Dental no notable dental hx.    Pulmonary neg pulmonary ROS, former smoker   Pulmonary exam normal        Cardiovascular hypertension, Pt. on medications and Pt. on home beta blockers + dysrhythmias Atrial Fibrillation  Rhythm:Regular Rate:Normal     Neuro/Psych negative neurological ROS  negative psych ROS   GI/Hepatic Neg liver ROS,GERD  Medicated,,  Endo/Other  Hypothyroidism    Renal/GU negative Renal ROS  negative genitourinary   Musculoskeletal  (+) Arthritis , Osteoarthritis,    Abdominal Normal abdominal exam  (+)   Peds  Hematology negative hematology ROS (+)   Anesthesia Other Findings   Reproductive/Obstetrics                             Anesthesia Physical Anesthesia Plan  ASA: 3  Anesthesia Plan: MAC and Regional   Post-op Pain Management: Regional block*   Induction: Intravenous  PONV Risk Score and Plan: 2 and Ondansetron, Dexamethasone, Propofol infusion and Treatment may vary due to age or medical condition  Airway Management Planned: Simple Face Mask, Natural Airway and Nasal Cannula  Additional Equipment: None  Intra-op Plan:   Post-operative Plan:   Informed Consent: I have reviewed the patients History and Physical, chart, labs and discussed the procedure including the risks, benefits and alternatives for the proposed anesthesia with the patient or authorized representative who has indicated his/her understanding and acceptance.     Dental advisory given  Plan Discussed with: CRNA  Anesthesia Plan Comments:        Anesthesia Quick Evaluation

## 2022-01-17 ENCOUNTER — Encounter (HOSPITAL_COMMUNITY): Payer: Self-pay | Admitting: Orthopedic Surgery

## 2022-01-17 DIAGNOSIS — H353221 Exudative age-related macular degeneration, left eye, with active choroidal neovascularization: Secondary | ICD-10-CM | POA: Diagnosis not present

## 2022-01-18 ENCOUNTER — Encounter (HOSPITAL_COMMUNITY): Payer: Self-pay | Admitting: Orthopedic Surgery

## 2022-01-24 DIAGNOSIS — S62321A Displaced fracture of shaft of second metacarpal bone, left hand, initial encounter for closed fracture: Secondary | ICD-10-CM | POA: Diagnosis not present

## 2022-01-24 DIAGNOSIS — M79642 Pain in left hand: Secondary | ICD-10-CM | POA: Diagnosis not present

## 2022-01-24 DIAGNOSIS — S62323A Displaced fracture of shaft of third metacarpal bone, left hand, initial encounter for closed fracture: Secondary | ICD-10-CM | POA: Diagnosis not present

## 2022-01-26 IMAGING — DX DG LUMBAR SPINE COMPLETE 4+V
5 series · 5 of 5 positions shown · non-contrast
Comparison: None.

CLINICAL DATA: Low back pain after a fall out of bed this morning.
Initial encounter.

EXAM:
LUMBAR SPINE - COMPLETE 4+ VIEW

[l-spine ap]
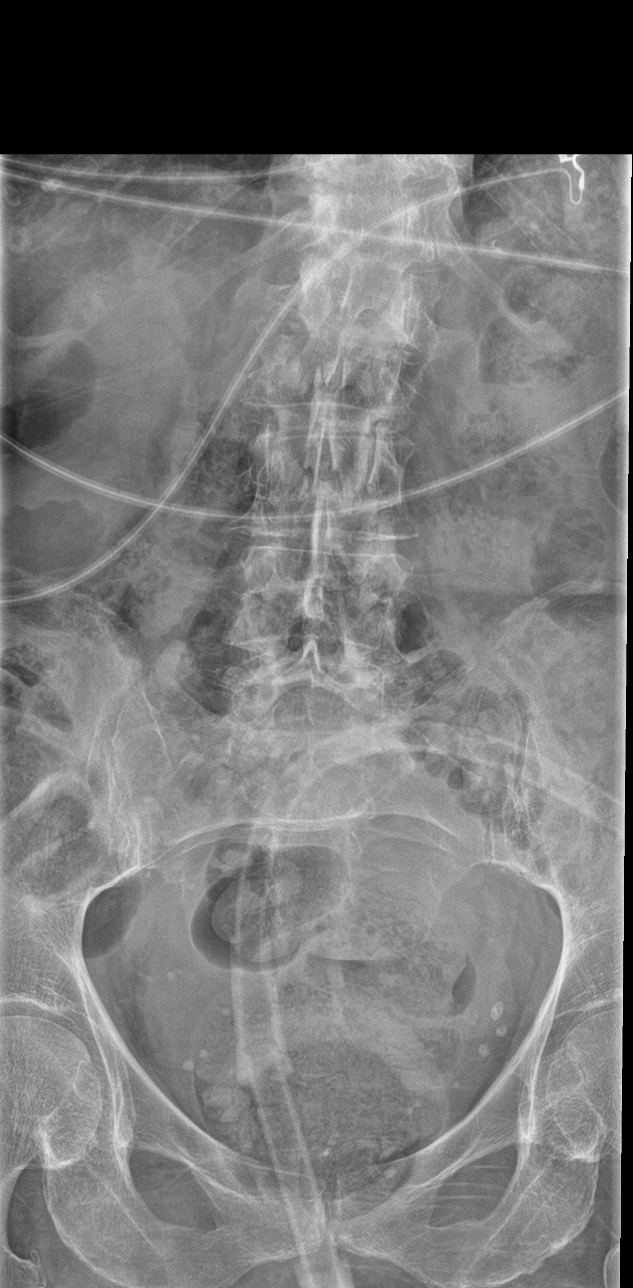

[l-spine obl (1 of 2)]
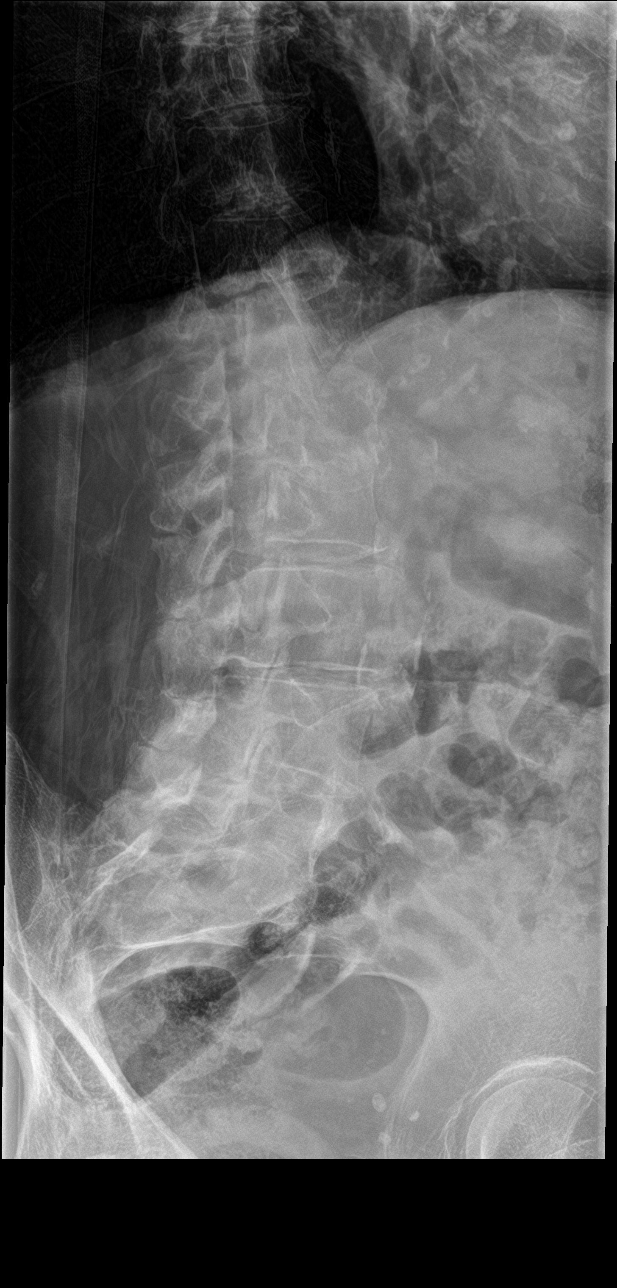

[l-spine obl (2 of 2)]
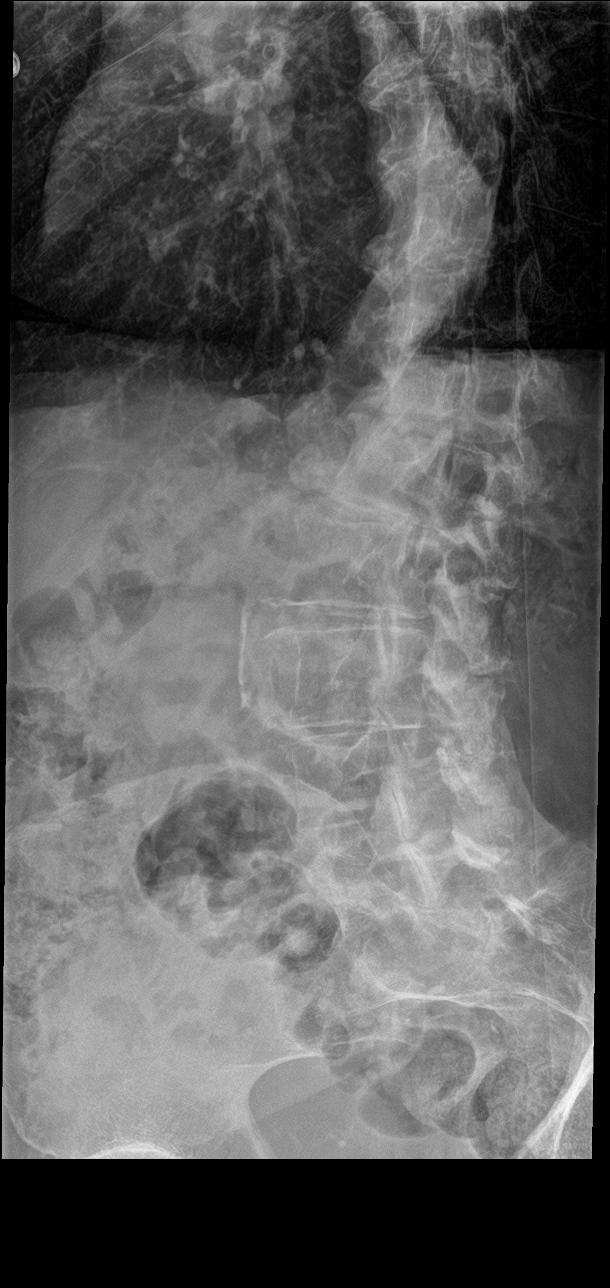

[l-spine lat]
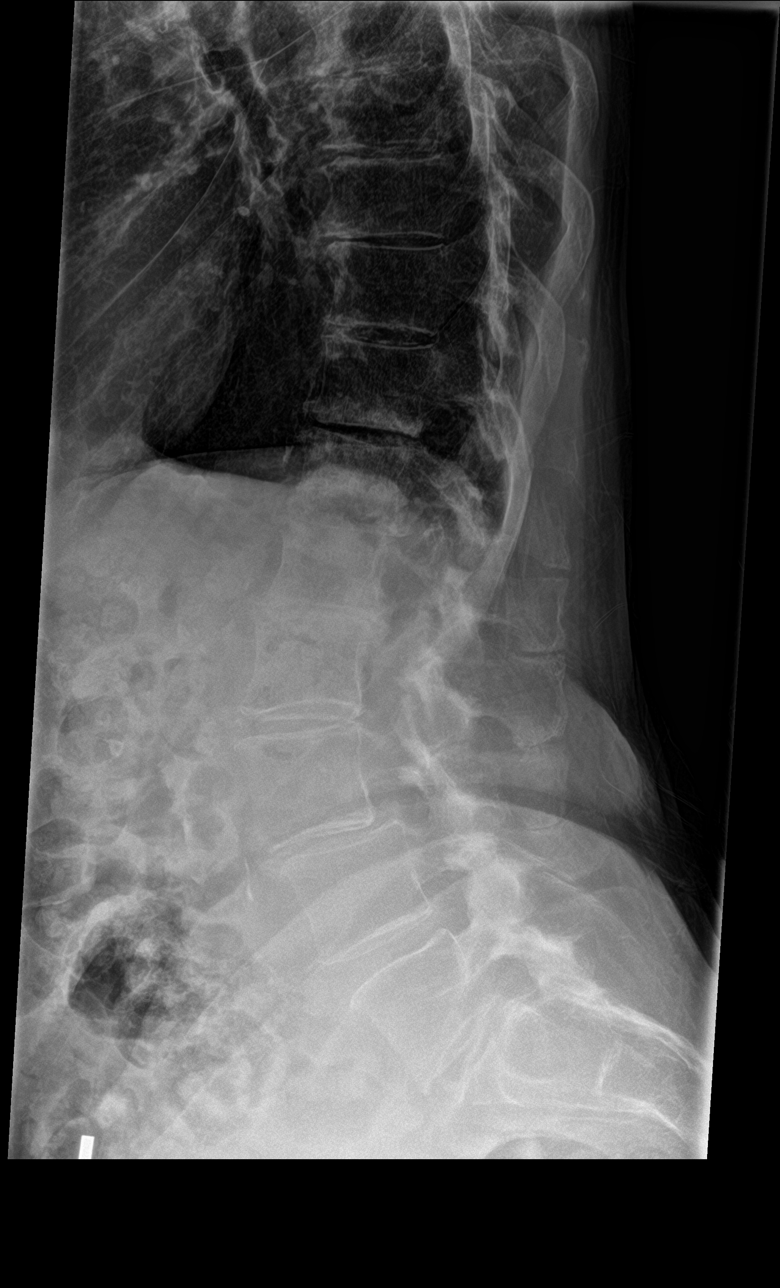

[l-spine spot]
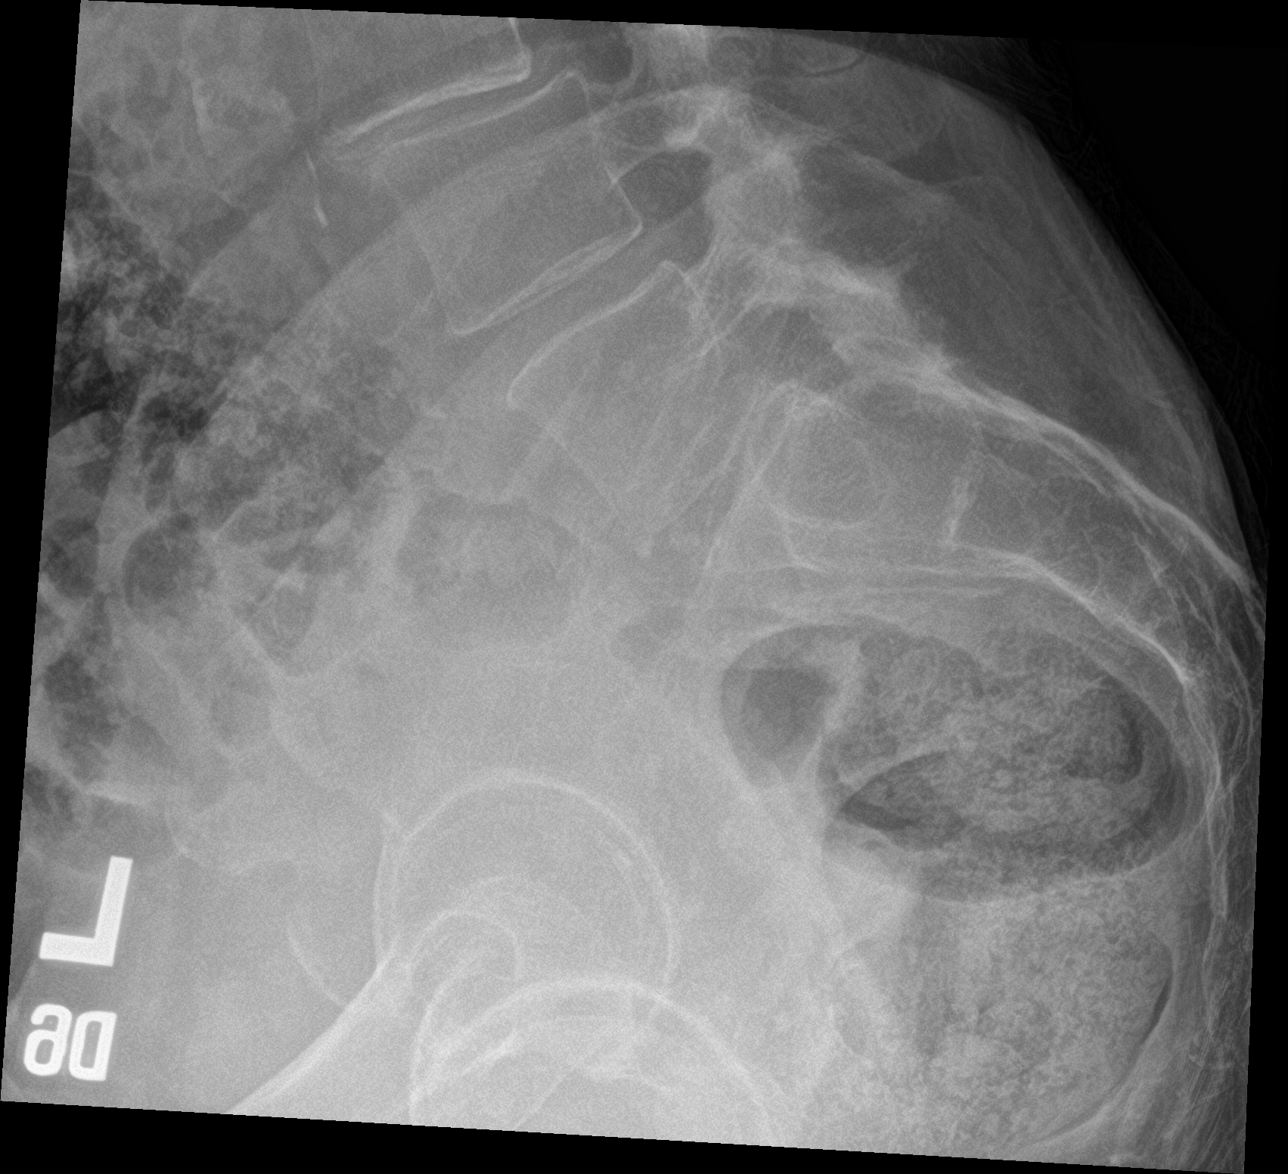

[5 of 5 positions shown; findings below may reference images not displayed]

FINDINGS: There is no fracture or other acute abnormality. 0.8 cm
anterolisthesis L3 on L4 due to facet arthropathy is noted. Convex
left scoliosis with the apex at T12 is seen. Loss of disc space
height is worst at T12-L1, L1-2 and L3-4.
IMPRESSION: No acute abnormality.

Lumbar degenerative change.

## 2022-01-26 IMAGING — CT CT HIP*R* W/O CM
2 of 3 series · 16 of 46 positions shown, 18 images · non-contrast
Comparison: None.

CLINICAL DATA: Right hip pain after a fall out of bed this morning.
Initial encounter.

EXAM:
CT OF THE RIGHT HIP WITHOUT CONTRAST
TECHNIQUE: Multidetector CT imaging of the right hip was performed according to
the standard protocol. Multiplanar CT image reconstructions were
also generated.

[Series 5: hip 2.0 st · axial · 0.50mm/px · z∈[-192,+42]mm · 13 of 135 slices shown, 15 images]
[im 9/135  soft-tissue]
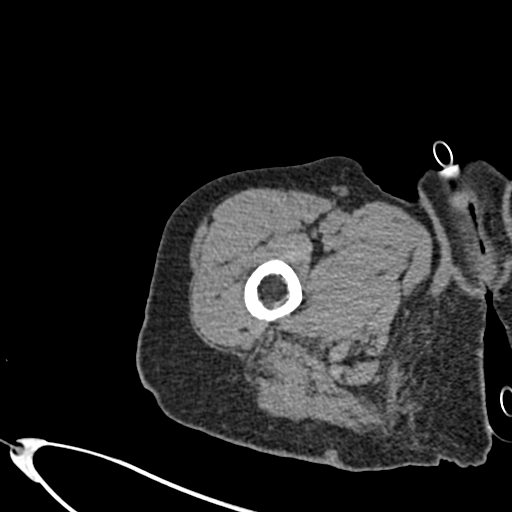
[im 9/135  bone]
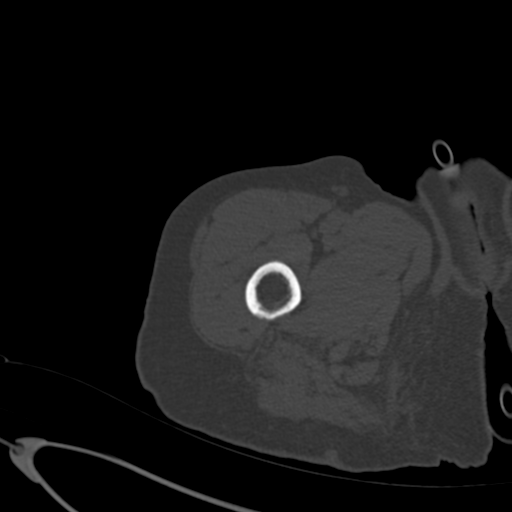
[im 18/135  soft-tissue]
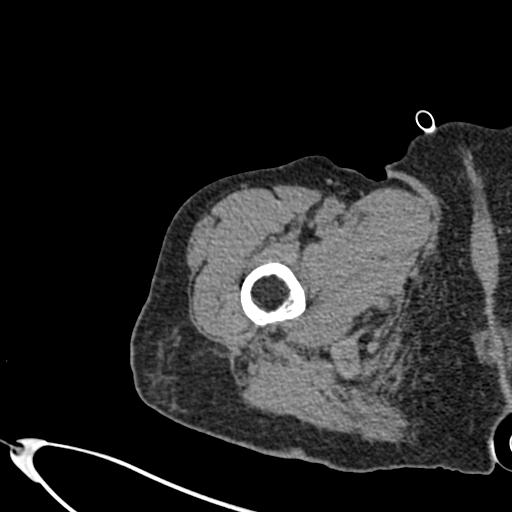
[im 26/135  soft-tissue]
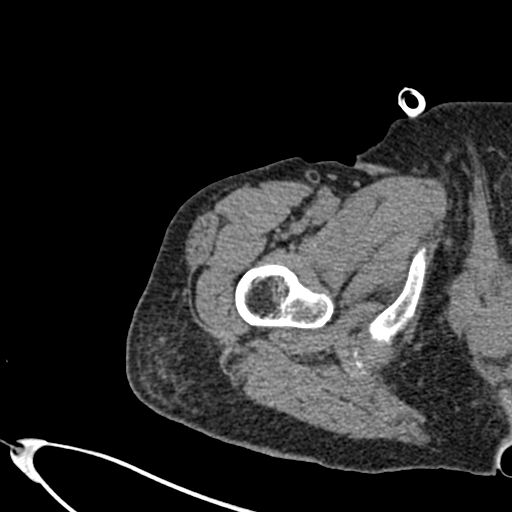
[im 39/135  soft-tissue]
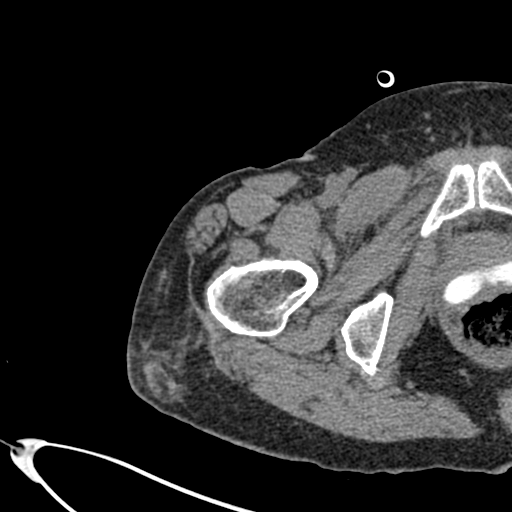
[im 48/135  soft-tissue]
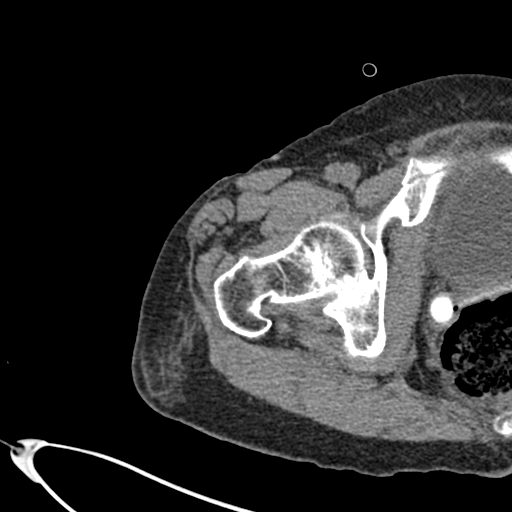
[im 57/135  soft-tissue]
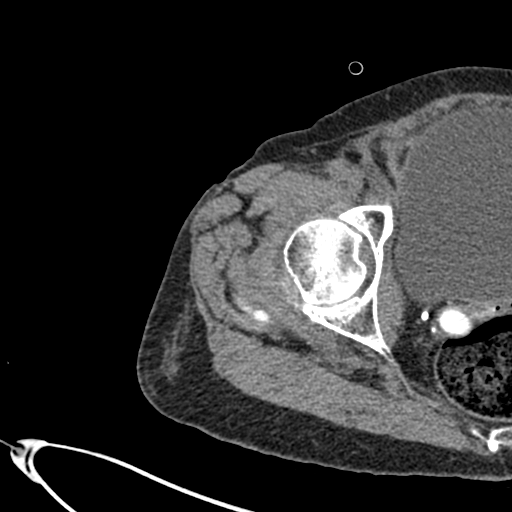
[im 70/135  soft-tissue]
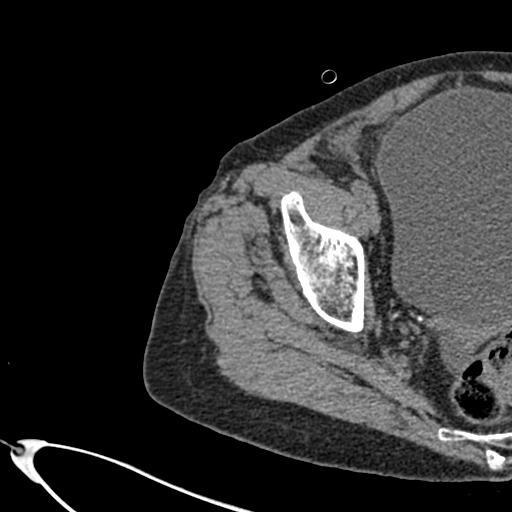
[im 78/135  soft-tissue]
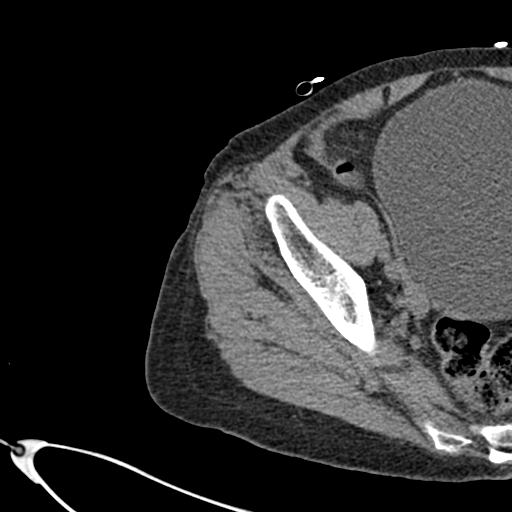
[im 87/135  soft-tissue]
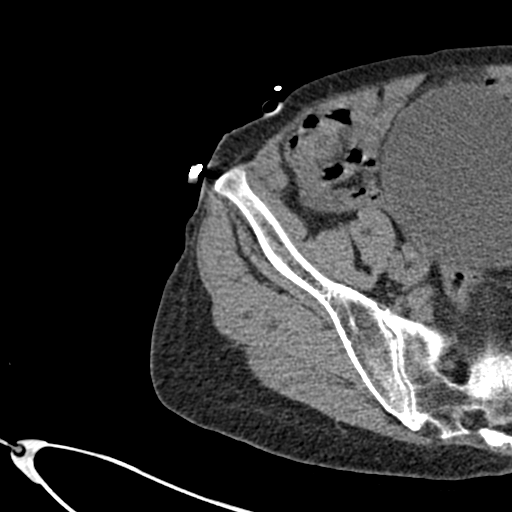
[im 87/135  bone]
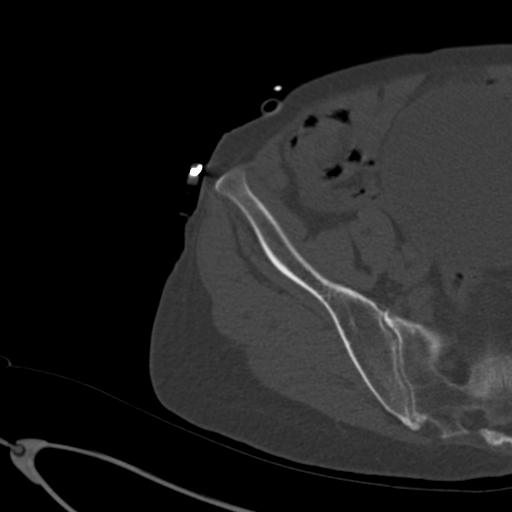
[im 96/135  soft-tissue]
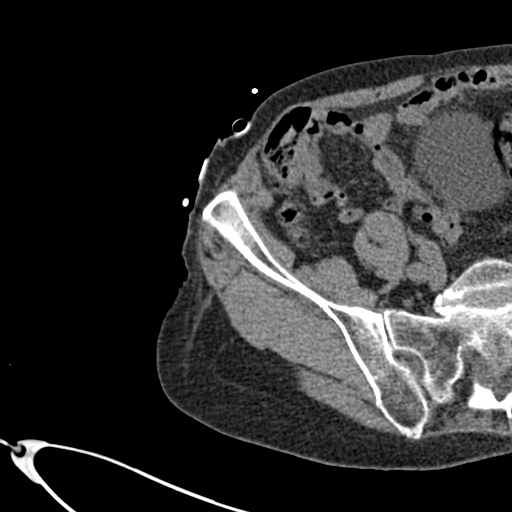
[im 109/135  soft-tissue]
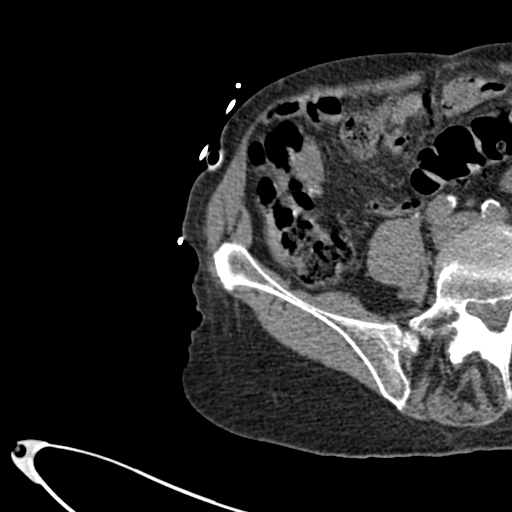
[im 117/135  soft-tissue]
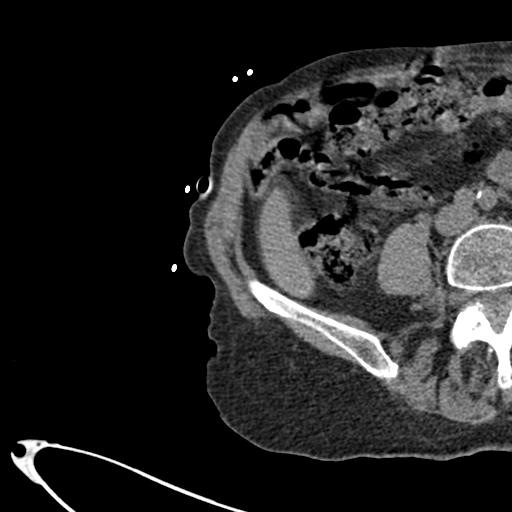
[im 126/135  soft-tissue]
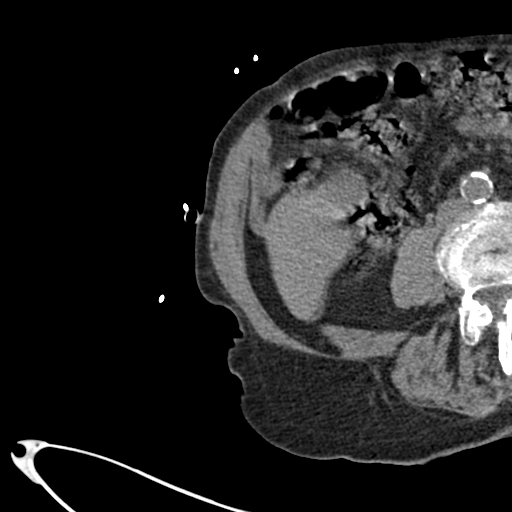

[Series 8: hip 2.0 cor. st · coronal · 0.40mm/px · 3 of 115 slices shown]
[im 39/115  soft-tissue]
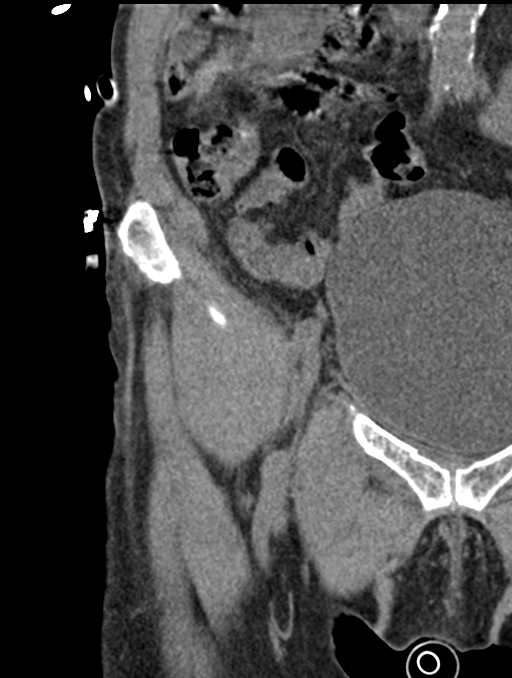
[im 51/115  soft-tissue]
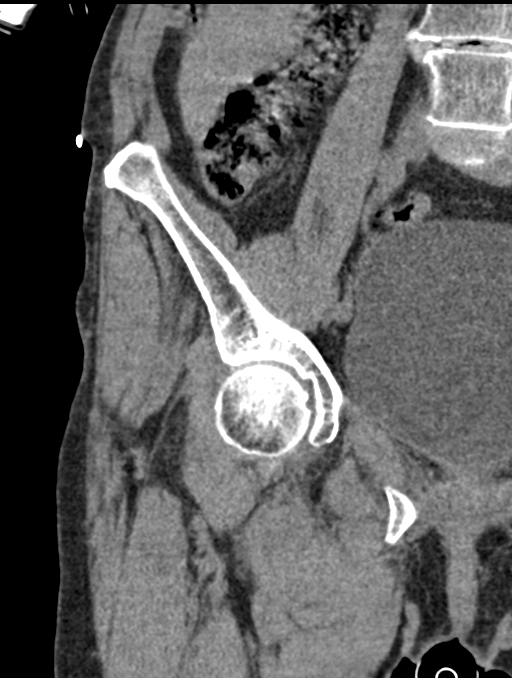
[im 64/115  soft-tissue]
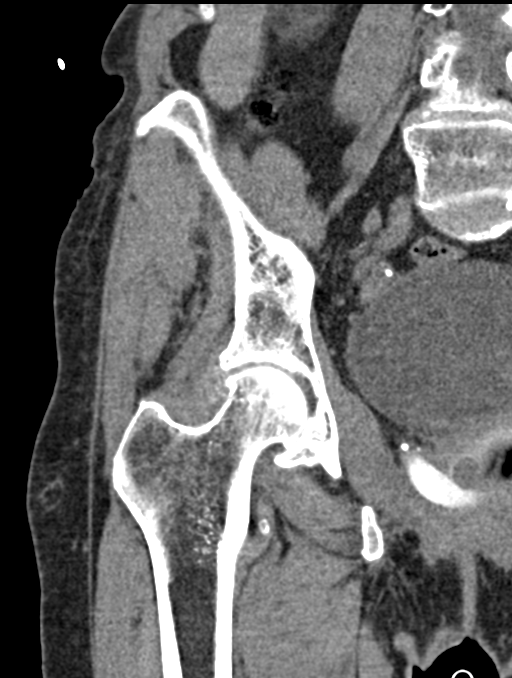

[16 of 46 positions shown; findings below may reference images not displayed]

FINDINGS: Bones/Joint/Cartilage

An acute, nondisplaced fracture of the right sacrum is identified.
Minimally displaced fracture of the superior pubic ramus is also
seen. Finally, mild buckling of the cortex of the inferior pubic
ramus is consistent with a nondisplaced fracture.

The right hip is and no hip fracture is seen. There is mild
degenerative disease about the hip. No lytic or sclerotic lesion is
identified.

Ligaments

Suboptimally assessed by CT.

Muscles and Tendons

Intact.

Soft tissues

Subcutaneous contusion adjacent to the right hip noted.
IMPRESSION: Acute fractures of the right sacrum and right superior and inferior
pubic rami.

Negative for hip fracture or dislocation.

Soft tissue contusion adjacent to the right hip.

Mild right hip osteoarthritis.

## 2022-01-26 IMAGING — CT CT CERVICAL SPINE W/O CM
3 of 4 series · 12 of 35 positions shown, 14 images · non-contrast
Comparison: None

CLINICAL DATA: Neck trauma.

EXAM:
CT HEAD WITHOUT CONTRAST
CT CERVICAL SPINE WITHOUT CONTRAST
TECHNIQUE: Multidetector CT imaging of the head and cervical spine was
performed following the standard protocol without intravenous
contrast. Multiplanar CT image reconstructions of the cervical spine
were also generated.

[Series 4: c_spine 2.0 st · axial · 0.34mm/px · z∈[-276,-172]mm · 4 of 79 slices shown, 5 images]
[im 14/79  soft-tissue]
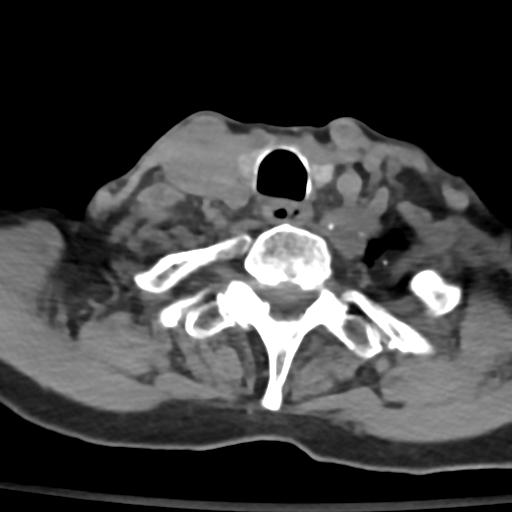
[im 14/79  bone]
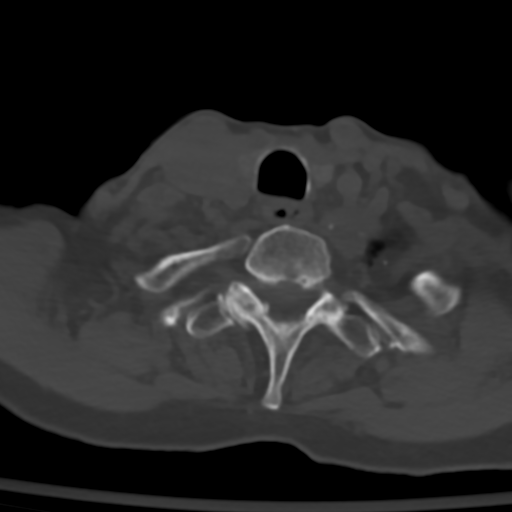
[im 27/79  bone]
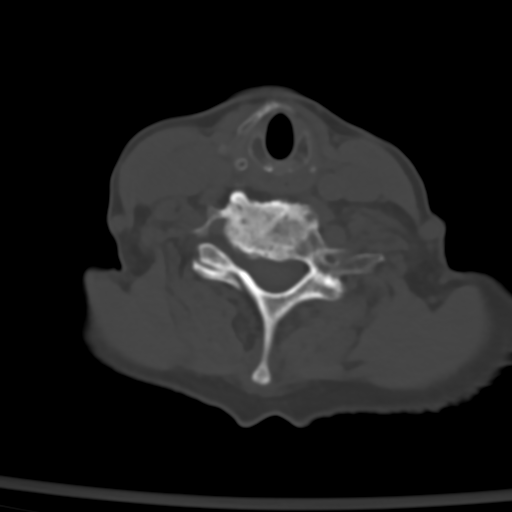
[im 53/79  bone]
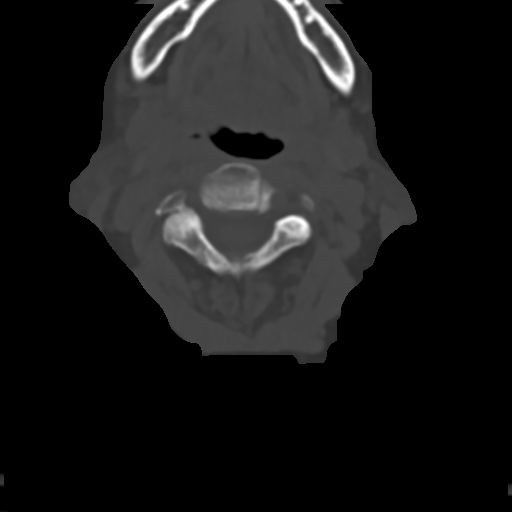
[im 66/79  bone]
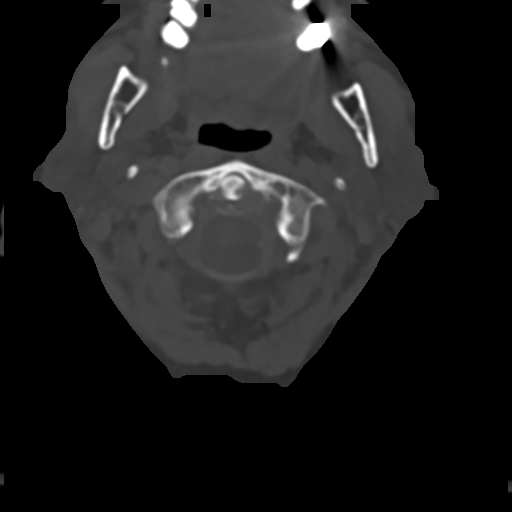

[Series 6: c_spine 2.0 sag bone · sagittal · 0.23mm/px · 5 of 61 slices shown, 6 images]
[im 21/61  bone]
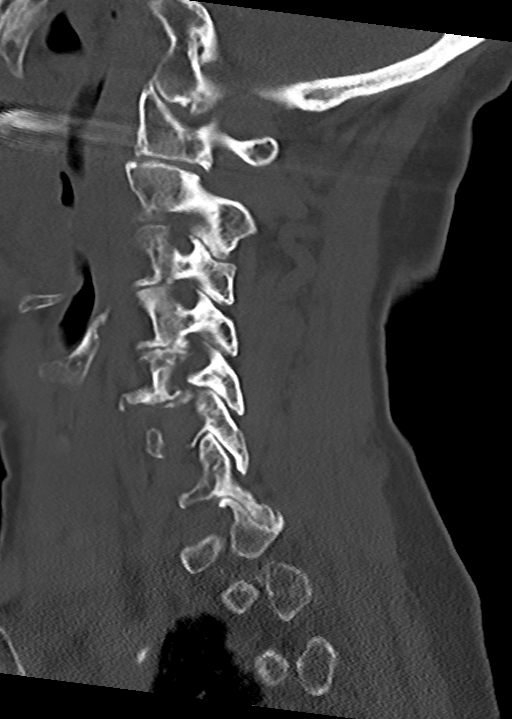
[im 26/61  bone]
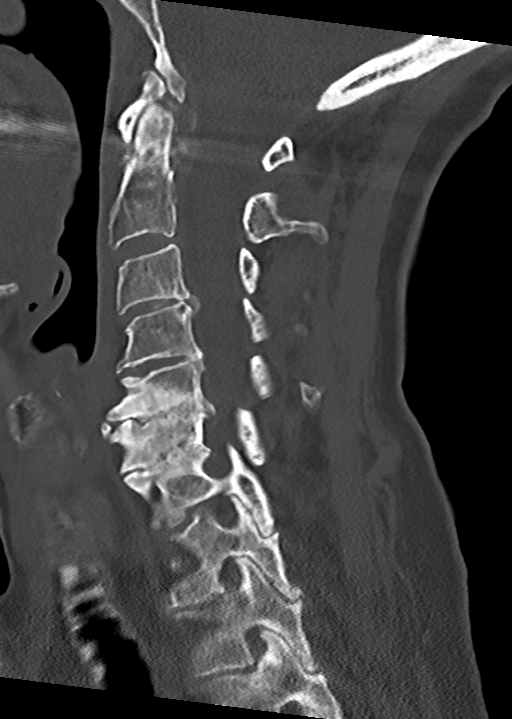
[im 31/61  soft-tissue]
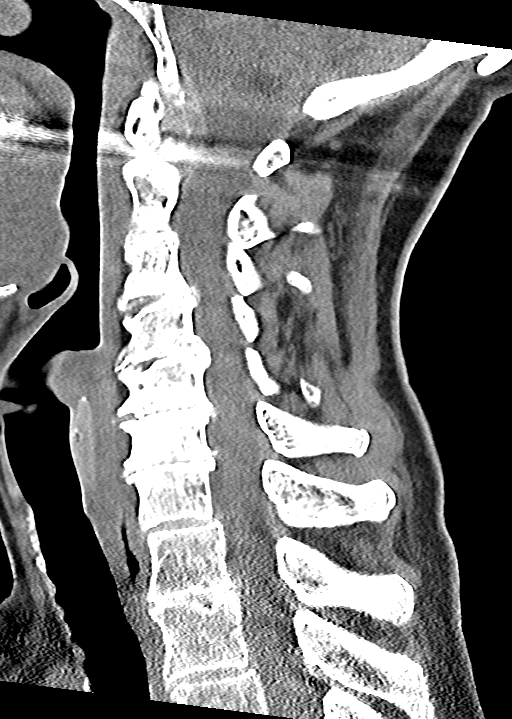
[im 31/61  bone]
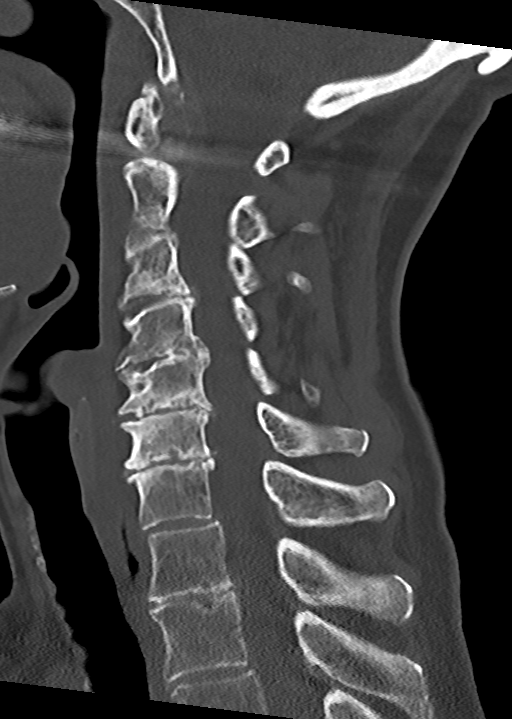
[im 36/61  bone]
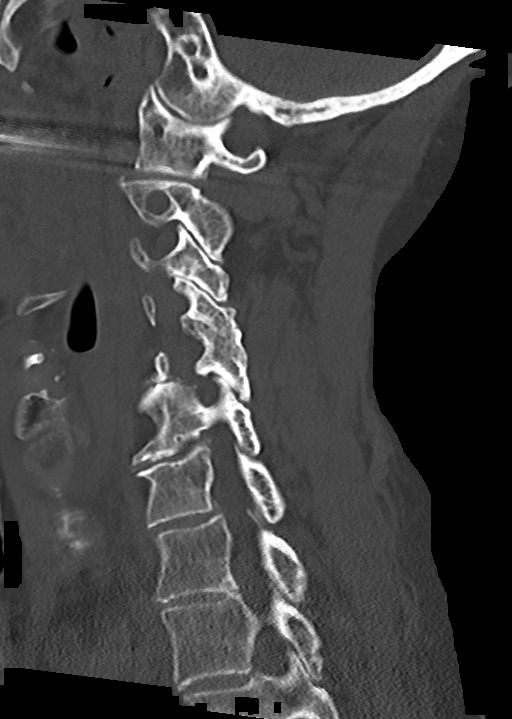
[im 41/61  bone]
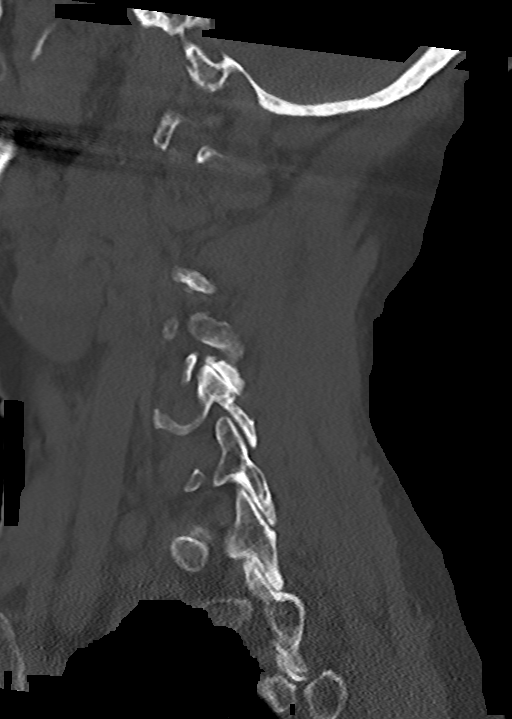

[Series 7: c_spine 2.0 cor bone · coronal · 0.23mm/px · 3 of 61 slices shown]
[im 13/61  bone]
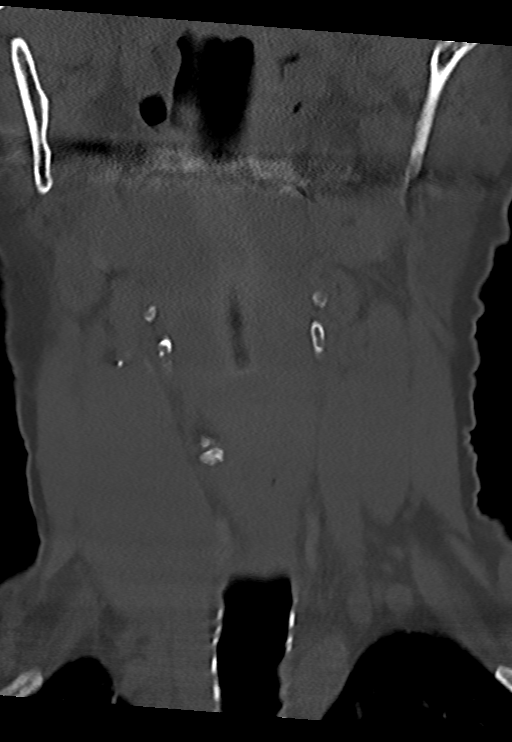
[im 25/61  bone]
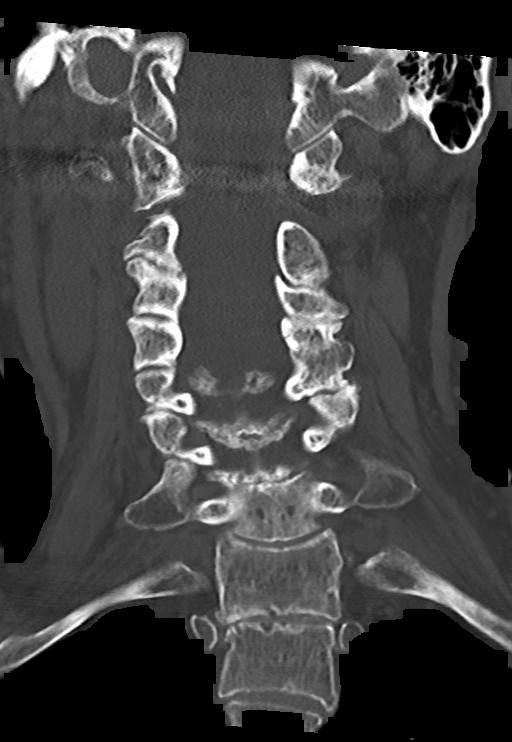
[im 37/61  bone]
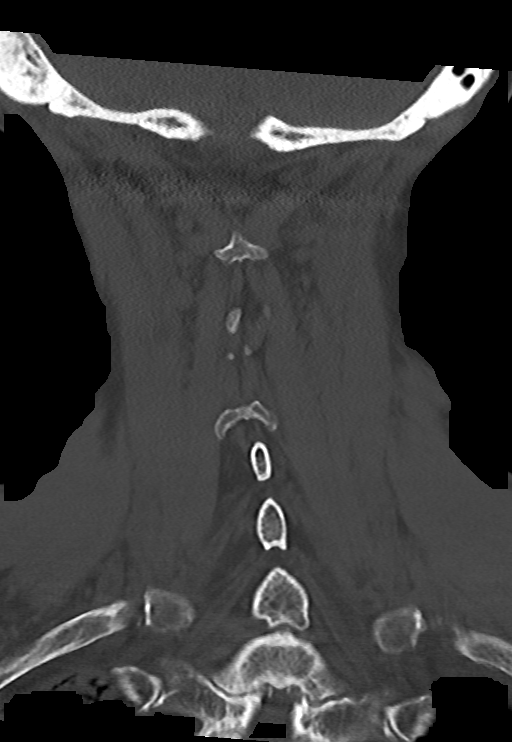

[12 of 35 positions shown; findings below may reference images not displayed]

FINDINGS: CT HEAD FINDINGS

Brain: No evidence of acute infarction, hemorrhage, hydrocephalus,
extra-axial collection or mass lesion/mass effect. Signs of atrophy
and chronic microvascular ischemic change.

Vascular: No hyperdense vessel or unexpected calcification.

Skull: Normal. Negative for fracture or focal lesion.

Sinuses/Orbits: Near complete opacification of the RIGHT maxillary
sinus indicative of chronic sinus disease. Thickening of the walls
of the LEFT maxillary sinus also with some mucosal thickening in the
LEFT maxillary sinus indicative of chronic sinus disease. Similar
changes though less pronounced in the ethmoid sinuses. Mild
involvement of LEFT sphenoid. No air-fluid level. Orbits are
unremarkable.

Other: None.

CT CERVICAL SPINE FINDINGS

Alignment: Straightening of normal cervical lordosis likely due to
patient position or spasm.

Skull base and vertebrae: No acute fracture. No primary bone lesion
or focal pathologic process.

Soft tissues and spinal canal: No prevertebral fluid or swelling. No
visible canal hematoma.

Disc levels: Degenerative changes of the atlantoaxial joint with
mild pannus formation.

Multilevel degenerative change with disc space narrowing and
uncovertebral degenerative changes. Degenerative changes greatest at
C5-6 and C6-7 with respect to disc space narrowing and uncovertebral
degenerative changes with similar changes seen throughout the
cervical spine.

Facet hypertrophy and facet degenerative changes greatest at C4-5 on
the LEFT.

Upper chest: Biapical pleural and parenchymal scarring greatest on
the RIGHT.

Other: None
IMPRESSION: 1. No acute intracranial abnormality.
2. Signs of atrophy and chronic microvascular ischemic change.
3. Chronic sinusitis with complete opacification in the RIGHT
maxillary sinus
4. No evidence for acute fracture or malalignment of the cervical
spine.
5. Multilevel spinal degenerative changes.
6. Biapical pleural and parenchymal scarring greatest on the RIGHT.

Aortic Atherosclerosis ([id number]-ZP5.5).

## 2022-01-27 ENCOUNTER — Other Ambulatory Visit: Payer: Self-pay | Admitting: Cardiovascular Disease

## 2022-01-28 NOTE — Telephone Encounter (Signed)
Prescription refill request for Eliquis received. Indication:afib Last office visit:10/23 Scr:1.2 Age: 89 Weight:52.6  kg  Prescription refilled

## 2022-02-01 DIAGNOSIS — M79642 Pain in left hand: Secondary | ICD-10-CM | POA: Diagnosis not present

## 2022-02-04 DIAGNOSIS — M79642 Pain in left hand: Secondary | ICD-10-CM | POA: Diagnosis not present

## 2022-02-11 DIAGNOSIS — M79642 Pain in left hand: Secondary | ICD-10-CM | POA: Diagnosis not present

## 2022-02-14 DIAGNOSIS — M79642 Pain in left hand: Secondary | ICD-10-CM | POA: Diagnosis not present

## 2022-02-14 DIAGNOSIS — S62323A Displaced fracture of shaft of third metacarpal bone, left hand, initial encounter for closed fracture: Secondary | ICD-10-CM | POA: Diagnosis not present

## 2022-03-11 DIAGNOSIS — R69 Illness, unspecified: Secondary | ICD-10-CM | POA: Diagnosis not present

## 2022-03-14 DIAGNOSIS — H353221 Exudative age-related macular degeneration, left eye, with active choroidal neovascularization: Secondary | ICD-10-CM | POA: Diagnosis not present

## 2022-03-14 DIAGNOSIS — M79642 Pain in left hand: Secondary | ICD-10-CM | POA: Diagnosis not present

## 2022-03-14 DIAGNOSIS — S62323A Displaced fracture of shaft of third metacarpal bone, left hand, initial encounter for closed fracture: Secondary | ICD-10-CM | POA: Diagnosis not present

## 2022-03-14 DIAGNOSIS — H43813 Vitreous degeneration, bilateral: Secondary | ICD-10-CM | POA: Diagnosis not present

## 2022-03-14 DIAGNOSIS — H353112 Nonexudative age-related macular degeneration, right eye, intermediate dry stage: Secondary | ICD-10-CM | POA: Diagnosis not present

## 2022-03-18 DIAGNOSIS — M79642 Pain in left hand: Secondary | ICD-10-CM | POA: Diagnosis not present

## 2022-03-24 ENCOUNTER — Other Ambulatory Visit: Payer: Self-pay | Admitting: Cardiovascular Disease

## 2022-03-25 DIAGNOSIS — M79642 Pain in left hand: Secondary | ICD-10-CM | POA: Diagnosis not present

## 2022-04-02 DIAGNOSIS — M79642 Pain in left hand: Secondary | ICD-10-CM | POA: Diagnosis not present

## 2022-04-11 DIAGNOSIS — S62321A Displaced fracture of shaft of second metacarpal bone, left hand, initial encounter for closed fracture: Secondary | ICD-10-CM | POA: Diagnosis not present

## 2022-04-11 DIAGNOSIS — M79642 Pain in left hand: Secondary | ICD-10-CM | POA: Diagnosis not present

## 2022-04-25 DIAGNOSIS — M79642 Pain in left hand: Secondary | ICD-10-CM | POA: Diagnosis not present

## 2022-04-25 DIAGNOSIS — L578 Other skin changes due to chronic exposure to nonionizing radiation: Secondary | ICD-10-CM | POA: Diagnosis not present

## 2022-04-25 DIAGNOSIS — L820 Inflamed seborrheic keratosis: Secondary | ICD-10-CM | POA: Diagnosis not present

## 2022-04-25 DIAGNOSIS — D225 Melanocytic nevi of trunk: Secondary | ICD-10-CM | POA: Diagnosis not present

## 2022-04-25 DIAGNOSIS — L814 Other melanin hyperpigmentation: Secondary | ICD-10-CM | POA: Diagnosis not present

## 2022-05-02 DIAGNOSIS — G629 Polyneuropathy, unspecified: Secondary | ICD-10-CM | POA: Diagnosis not present

## 2022-05-02 DIAGNOSIS — E039 Hypothyroidism, unspecified: Secondary | ICD-10-CM | POA: Diagnosis not present

## 2022-05-02 DIAGNOSIS — M1990 Unspecified osteoarthritis, unspecified site: Secondary | ICD-10-CM | POA: Diagnosis not present

## 2022-05-02 DIAGNOSIS — I10 Essential (primary) hypertension: Secondary | ICD-10-CM | POA: Diagnosis not present

## 2022-05-02 DIAGNOSIS — Z7901 Long term (current) use of anticoagulants: Secondary | ICD-10-CM | POA: Diagnosis not present

## 2022-05-02 DIAGNOSIS — K219 Gastro-esophageal reflux disease without esophagitis: Secondary | ICD-10-CM | POA: Diagnosis not present

## 2022-05-02 DIAGNOSIS — Z87891 Personal history of nicotine dependence: Secondary | ICD-10-CM | POA: Diagnosis not present

## 2022-05-02 DIAGNOSIS — Z8249 Family history of ischemic heart disease and other diseases of the circulatory system: Secondary | ICD-10-CM | POA: Diagnosis not present

## 2022-05-02 DIAGNOSIS — D6869 Other thrombophilia: Secondary | ICD-10-CM | POA: Diagnosis not present

## 2022-05-02 DIAGNOSIS — M2040 Other hammer toe(s) (acquired), unspecified foot: Secondary | ICD-10-CM | POA: Diagnosis not present

## 2022-05-02 DIAGNOSIS — I4891 Unspecified atrial fibrillation: Secondary | ICD-10-CM | POA: Diagnosis not present

## 2022-05-02 DIAGNOSIS — J301 Allergic rhinitis due to pollen: Secondary | ICD-10-CM | POA: Diagnosis not present

## 2022-05-02 DIAGNOSIS — Z008 Encounter for other general examination: Secondary | ICD-10-CM | POA: Diagnosis not present

## 2022-05-06 DIAGNOSIS — M79642 Pain in left hand: Secondary | ICD-10-CM | POA: Diagnosis not present

## 2022-05-14 DIAGNOSIS — M79642 Pain in left hand: Secondary | ICD-10-CM | POA: Diagnosis not present

## 2022-05-14 DIAGNOSIS — S62321A Displaced fracture of shaft of second metacarpal bone, left hand, initial encounter for closed fracture: Secondary | ICD-10-CM | POA: Diagnosis not present

## 2022-05-22 ENCOUNTER — Ambulatory Visit: Payer: Medicare HMO | Admitting: Student

## 2022-05-23 DIAGNOSIS — H353221 Exudative age-related macular degeneration, left eye, with active choroidal neovascularization: Secondary | ICD-10-CM | POA: Diagnosis not present

## 2022-05-23 DIAGNOSIS — H43813 Vitreous degeneration, bilateral: Secondary | ICD-10-CM | POA: Diagnosis not present

## 2022-05-23 DIAGNOSIS — H353112 Nonexudative age-related macular degeneration, right eye, intermediate dry stage: Secondary | ICD-10-CM | POA: Diagnosis not present

## 2022-05-27 DIAGNOSIS — M79642 Pain in left hand: Secondary | ICD-10-CM | POA: Diagnosis not present

## 2022-06-11 NOTE — Progress Notes (Unsigned)
Cardiology Office Note:   Date:  06/13/2022  NAME:  Kathleen Santana    MRN: [id number] DOB:  1926/08/24   PCP:  Deloris Ping, MD  Cardiologist:  None  Electrophysiologist:  None   Referring MD: Deloris Ping, *   Chief Complaint  Patient presents with   Follow-up        History of Present Illness:   Kathleen Santana is a 89 y.o. female with a hx of paroxysmal atrial fibrillation, hypertension, hypothyroidism who presents for follow-up.  She reports she is doing well.  Denies any chest pain or trouble breathing.  Still walking 3 miles per day.  No limitations.  Maintaining sinus rhythm on examination.  Overall doing quite well.  BP well-controlled.  Without any major complaints today.  She will be 89 in June.  Problem List 1. Paroxysmal Afib -Afib with RVR 04/06/2020 -4% Afib burden -CHADSVASC=4 (age, sex, HTN) 2. Atrial tachycardia/PACs -6.3% PAC burden 3. HTN 4. HLD 5. Hypothyroidism   Past Medical History: Past Medical History:  Diagnosis Date   Afib (HCC)    Arrhythmia    COVID-19    2022   GERD (gastroesophageal reflux disease)    Hypertension    Hypothyroidism    Lower extremity neuropathy    Osteoporosis    Pneumonia    2021   Rhinitis, non-allergic     Past Surgical History: Past Surgical History:  Procedure Laterality Date   BACK SURGERY     CATARACT EXTRACTION Bilateral    2021   NOSE SURGERY     OPEN REDUCTION INTERNAL FIXATION (ORIF) METACARPAL Left 01/13/2022   Procedure: OPEN REDUCTION INTERNAL FIXATION (ORIF) METACARPAL, LEFT INDEX AND LONG FINGER;  Surgeon: Bradly Bienenstock, MD;  Location: [facility] OR;  Service: Orthopedics;  Laterality: Left;  REGIONAL WITH IV SEDATION    Current Medications: Current Meds  Medication Sig   acetaminophen (TYLENOL) 500 MG tablet Take 500 mg by mouth every 6 (six) hours as needed (pain.).   Cholecalciferol (VITAMIN D-3 PO) Take 1,000 Units by mouth daily with breakfast.   gabapentin (NEURONTIN)  300 MG capsule Take 300 mg by mouth in the morning, at noon, and at bedtime.   ipratropium (ATROVENT) 0.06 % nasal spray Place 1 spray into both nostrils 3 (three) times daily as needed for rhinitis.   levothyroxine (SYNTHROID) 50 MCG tablet Take 50 mcg by mouth daily before breakfast.   magnesium oxide (MAG-OX) 400 (240 Mg) MG tablet Take 400 mg by mouth at bedtime.   Multiple Vitamin (MULTIVITAMIN WITH MINERALS) TABS tablet Take 1 tablet by mouth daily with breakfast. One A Day for Women   Multiple Vitamins-Minerals (PRESERVISION AREDS) CAPS Take 1 capsule by mouth in the morning and at bedtime.   omeprazole (PRILOSEC) 20 MG capsule Take 20 mg by mouth daily as needed (before eating spicy/hot foods).    Propylene Glycol, PF, (SYSTANE COMPLETE PF) 0.6 % SOLN Place 1 drop into both eyes 3 (three) times daily as needed (dry/irritated eyes.).   simethicone (MYLICON) 125 MG chewable tablet Chew 125 mg by mouth 2 (two) times daily as needed for flatulence.   [DISCONTINUED] amLODipine (NORVASC) 2.5 MG tablet Take 2.5 mg by mouth every evening.   [DISCONTINUED] apixaban (ELIQUIS) 2.5 MG TABS tablet TAKE 1 TABLET BY MOUTH TWICE A DAY   [DISCONTINUED] lisinopril (ZESTRIL) 10 MG tablet TAKE ONE TABLET BY MOUTH EVERY MORNING AND TAKE ONE TABLET BY MOUTH EVERY EVENING   [DISCONTINUED] metoprolol succinate (TOPROL-XL) 25  MG 24 hr tablet TAKE 1 TABLET BY MOUTH DAILY     Allergies:    Nickel, Norco [hydrocodone-acetaminophen], and Other   Social History: Social History   Socioeconomic History   Marital status: Widowed    Spouse name: Not on file   Number of children: 2   Years of education: Not on file   Highest education level: Not on file  Occupational History   Occupation: retired  Tobacco Use   Smoking status: Former    Years: 16    Types: Cigarettes   Smokeless tobacco: Never  Substance and Sexual Activity   Alcohol use: Never   Drug use: Never   Sexual activity: Not on file  Other  Topics Concern   Not on file  Social History Narrative   Not on file   Social Determinants of Health   Financial Resource Strain: Not on file  Food Insecurity: Not on file  Transportation Needs: Not on file  Physical Activity: Not on file  Stress: Not on file  Social Connections: Not on file     Family History: The patient's family history includes Arrhythmia in her mother; Heart disease in her father.  ROS:   All other ROS reviewed and negative. Pertinent positives noted in the HPI.     EKGs/Labs/Other Studies Reviewed:   The following studies were personally reviewed by me today:  Recent Labs: 01/10/2022: BUN 27; Creatinine, Ser 1.20; Hemoglobin 10.9; Platelets 263; Potassium 4.1; Sodium 136   Recent Lipid Panel No results found for: "CHOL", "TRIG", "HDL", "CHOLHDL", "VLDL", "LDLCALC", "LDLDIRECT"  Physical Exam:   VS:  BP 124/70 (BP Location: Left Arm, Patient Position: Sitting, Cuff Size: Normal)   Pulse (!) 48   Ht 5\' 2"  (1.575 m)   Wt 115 lb 3.2 oz (52.3 kg)   SpO2 100%   BMI 21.07 kg/m    Wt Readings from Last 3 Encounters:  06/13/22 115 lb 3.2 oz (52.3 kg)  01/13/22 116 lb (52.6 kg)  01/10/22 114 lb 10.2 oz (52 kg)    General: Well nourished, well developed, in no acute distress Head: Atraumatic, normal size  Eyes: PEERLA, EOMI  Neck: Supple, no JVD Endocrine: No thryomegaly Cardiac: Normal S1, S2; RRR; no murmurs, rubs, or gallops Lungs: Clear to auscultation bilaterally, no wheezing, rhonchi or rales  Abd: Soft, nontender, no hepatomegaly  Ext: No edema, pulses 2+ Musculoskeletal: No deformities, BUE and BLE strength normal and equal Skin: Warm and dry, no rashes   Neuro: Alert and oriented to person, place, time, and situation, CNII-XII grossly intact, no focal deficits  Psych: Normal mood and affect   ASSESSMENT:   Kathleen Santana is a 89 y.o. female who presents for the following: 1. Paroxysmal atrial fibrillation (HCC)   2. Atrial  tachycardia   3. Acquired thrombophilia (HCC)   4. Primary hypertension     PLAN:   1. Paroxysmal atrial fibrillation (HCC) -No recurrence of arrhythmia.  Well-controlled on metoprolol succinate.  Sinus rhythm by examination today.  She will continue this.  On Eliquis.  No bleeding issues.  Heart rate 48 but higher on my examination.  No symptoms.  Will continue to monitor this.  2. Atrial tachycardia -No recurrence.  3. Acquired thrombophilia (HCC) -On Eliquis 2.5 mg twice daily.  She will continue this.  4. Primary hypertension -Continue current regimen.  Well-controlled.  No changes.      Disposition: Return in about 6 months (around 12/14/2022).  Medication Adjustments/Labs and Tests Ordered: Current  medicines are reviewed at length with the patient today.  Concerns regarding medicines are outlined above.  No orders of the defined types were placed in this encounter.  Meds ordered this encounter  Medications   amLODipine (NORVASC) 2.5 MG tablet    Sig: Take 1 tablet (2.5 mg total) by mouth every evening.    Dispense:  90 tablet    Refill:  3   apixaban (ELIQUIS) 2.5 MG TABS tablet    Sig: Take 1 tablet (2.5 mg total) by mouth 2 (two) times daily.    Dispense:  60 tablet    Refill:  5   lisinopril (ZESTRIL) 10 MG tablet    Sig: TAKE ONE TABLET BY MOUTH EVERY MORNING AND TAKE ONE TABLET BY MOUTH EVERY EVENING    Dispense:  180 tablet    Refill:  3   metoprolol succinate (TOPROL-XL) 25 MG 24 hr tablet    Sig: Take 1 tablet (25 mg total) by mouth daily.    Dispense:  90 tablet    Refill:  3    Patient Instructions  Medication Instructions:  The current medical regimen is effective;  continue present plan and medications.  *If you need a refill on your cardiac medications before your next appointment, please call your pharmacy*   Follow-Up: At [facility], you and your health needs are our priority.  As part of our continuing mission to provide you with  exceptional heart care, we have created designated Provider Care Teams.  These Care Teams include your primary Cardiologist (physician) and Advanced Practice Providers (APPs -  Physician Assistants and Nurse Practitioners) who all work together to provide you with the care you need, when you need it.  We recommend signing up for the patient portal called "MyChart".  Sign up information is provided on this After Visit Summary.  MyChart is used to connect with patients for Virtual Visits (Telemedicine).  Patients are able to view lab/test results, encounter notes, upcoming appointments, etc.  Non-urgent messages can be sent to your provider as well.   To learn more about what you can do with MyChart, go to [email or web address].    Your next appointment:   6 month(s)  Provider:   Lennie Odor, MD      Time Spent with Patient: I have spent a total of 25 minutes with patient reviewing hospital notes, telemetry, EKGs, labs and examining the patient as well as establishing an assessment and plan that was discussed with the patient.  > 50% of time was spent in direct patient care.  Signed, Lenna Gilford. Flora Lipps, MD, [facility]  [facility]  [address] [place], [place] [address] [phone]  06/13/2022 1:07 PM

## 2022-06-12 DIAGNOSIS — Z1330 Encounter for screening examination for mental health and behavioral disorders, unspecified: Secondary | ICD-10-CM | POA: Diagnosis not present

## 2022-06-12 DIAGNOSIS — I480 Paroxysmal atrial fibrillation: Secondary | ICD-10-CM | POA: Diagnosis not present

## 2022-06-12 DIAGNOSIS — M79671 Pain in right foot: Secondary | ICD-10-CM | POA: Diagnosis not present

## 2022-06-12 DIAGNOSIS — R202 Paresthesia of skin: Secondary | ICD-10-CM | POA: Diagnosis not present

## 2022-06-13 ENCOUNTER — Encounter: Payer: Self-pay | Admitting: Cardiovascular Disease

## 2022-06-13 ENCOUNTER — Ambulatory Visit: Payer: Medicare HMO | Attending: Student | Admitting: Cardiovascular Disease

## 2022-06-13 VITALS — BP 124/70 | HR 48 | Ht 62.0 in | Wt 115.2 lb

## 2022-06-13 DIAGNOSIS — D6869 Other thrombophilia: Secondary | ICD-10-CM

## 2022-06-13 DIAGNOSIS — I4719 Other supraventricular tachycardia: Secondary | ICD-10-CM | POA: Diagnosis not present

## 2022-06-13 DIAGNOSIS — I480 Paroxysmal atrial fibrillation: Secondary | ICD-10-CM | POA: Diagnosis not present

## 2022-06-13 DIAGNOSIS — I10 Essential (primary) hypertension: Secondary | ICD-10-CM

## 2022-06-13 MED ORDER — METOPROLOL SUCCINATE ER 25 MG PO TB24: 25.0000 mg | ORAL_TABLET | Freq: Every day | ORAL | 3 refills | Status: DC

## 2022-06-13 MED ORDER — AMLODIPINE BESYLATE 2.5 MG PO TABS: 2.5000 mg | ORAL_TABLET | Freq: Every evening | ORAL | 3 refills | Status: DC

## 2022-06-13 MED ORDER — APIXABAN 2.5 MG PO TABS: 2.5000 mg | ORAL_TABLET | Freq: Two times a day (BID) | ORAL | 5 refills | Status: DC

## 2022-06-13 MED ORDER — LISINOPRIL 10 MG PO TABS: ORAL_TABLET | ORAL | 3 refills | Status: DC

## 2022-06-13 NOTE — Patient Instructions (Signed)
Medication Instructions:  The current medical regimen is effective;  continue present plan and medications.  *If you need a refill on your cardiac medications before your next appointment, please call your pharmacy*   Follow-Up: At [facility], you and your health needs are our priority.  As part of our continuing mission to provide you with exceptional heart care, we have created designated Provider Care Teams.  These Care Teams include your primary Cardiologist (physician) and Advanced Practice Providers (APPs -  Physician Assistants and Nurse Practitioners) who all work together to provide you with the care you need, when you need it.  We recommend signing up for the patient portal called "MyChart".  Sign up information is provided on this After Visit Summary.  MyChart is used to connect with patients for Virtual Visits (Telemedicine).  Patients are able to view lab/test results, encounter notes, upcoming appointments, etc.  Non-urgent messages can be sent to your provider as well.   To learn more about what you can do with MyChart, go to [email or web address].    Your next appointment:   6 month(s)  Provider:   Story O'Neal, MD   

## 2022-06-25 DIAGNOSIS — S62321A Displaced fracture of shaft of second metacarpal bone, left hand, initial encounter for closed fracture: Secondary | ICD-10-CM | POA: Diagnosis not present

## 2022-06-25 DIAGNOSIS — M79642 Pain in left hand: Secondary | ICD-10-CM | POA: Diagnosis not present

## 2022-07-23 DIAGNOSIS — N1831 Chronic kidney disease, stage 3a: Secondary | ICD-10-CM | POA: Diagnosis not present

## 2022-07-23 DIAGNOSIS — I480 Paroxysmal atrial fibrillation: Secondary | ICD-10-CM | POA: Diagnosis not present

## 2022-07-23 DIAGNOSIS — E039 Hypothyroidism, unspecified: Secondary | ICD-10-CM | POA: Diagnosis not present

## 2022-07-23 DIAGNOSIS — J3089 Other allergic rhinitis: Secondary | ICD-10-CM | POA: Diagnosis not present

## 2022-07-23 DIAGNOSIS — R739 Hyperglycemia, unspecified: Secondary | ICD-10-CM | POA: Diagnosis not present

## 2022-07-23 DIAGNOSIS — Z79899 Other long term (current) drug therapy: Secondary | ICD-10-CM | POA: Diagnosis not present

## 2022-07-23 DIAGNOSIS — Z0000 Encounter for general adult medical examination without abnormal findings: Secondary | ICD-10-CM | POA: Diagnosis not present

## 2022-07-23 DIAGNOSIS — I10 Essential (primary) hypertension: Secondary | ICD-10-CM | POA: Diagnosis not present

## 2022-07-23 DIAGNOSIS — E559 Vitamin D deficiency, unspecified: Secondary | ICD-10-CM | POA: Diagnosis not present

## 2022-07-23 DIAGNOSIS — M792 Neuralgia and neuritis, unspecified: Secondary | ICD-10-CM | POA: Diagnosis not present

## 2022-07-23 DIAGNOSIS — E538 Deficiency of other specified B group vitamins: Secondary | ICD-10-CM | POA: Diagnosis not present

## 2022-07-23 DIAGNOSIS — K219 Gastro-esophageal reflux disease without esophagitis: Secondary | ICD-10-CM | POA: Diagnosis not present

## 2022-07-23 DIAGNOSIS — M5136 Other intervertebral disc degeneration, lumbar region: Secondary | ICD-10-CM | POA: Diagnosis not present

## 2022-08-08 DIAGNOSIS — H353221 Exudative age-related macular degeneration, left eye, with active choroidal neovascularization: Secondary | ICD-10-CM | POA: Diagnosis not present

## 2022-08-08 DIAGNOSIS — H43813 Vitreous degeneration, bilateral: Secondary | ICD-10-CM | POA: Diagnosis not present

## 2022-08-08 DIAGNOSIS — H353112 Nonexudative age-related macular degeneration, right eye, intermediate dry stage: Secondary | ICD-10-CM | POA: Diagnosis not present

## 2022-08-27 DIAGNOSIS — M65331 Trigger finger, right middle finger: Secondary | ICD-10-CM | POA: Diagnosis not present

## 2022-08-27 DIAGNOSIS — S62321A Displaced fracture of shaft of second metacarpal bone, left hand, initial encounter for closed fracture: Secondary | ICD-10-CM | POA: Diagnosis not present

## 2022-09-02 DIAGNOSIS — R92323 Mammographic fibroglandular density, bilateral breasts: Secondary | ICD-10-CM | POA: Diagnosis not present

## 2022-09-02 DIAGNOSIS — Z1231 Encounter for screening mammogram for malignant neoplasm of breast: Secondary | ICD-10-CM | POA: Diagnosis not present

## 2022-11-07 DIAGNOSIS — H353112 Nonexudative age-related macular degeneration, right eye, intermediate dry stage: Secondary | ICD-10-CM | POA: Diagnosis not present

## 2022-11-07 DIAGNOSIS — H353221 Exudative age-related macular degeneration, left eye, with active choroidal neovascularization: Secondary | ICD-10-CM | POA: Diagnosis not present

## 2022-11-07 DIAGNOSIS — H43813 Vitreous degeneration, bilateral: Secondary | ICD-10-CM | POA: Diagnosis not present

## 2022-11-13 DIAGNOSIS — H6121 Impacted cerumen, right ear: Secondary | ICD-10-CM | POA: Diagnosis not present

## 2022-11-13 DIAGNOSIS — J300 Vasomotor rhinitis: Secondary | ICD-10-CM | POA: Diagnosis not present

## 2022-12-10 ENCOUNTER — Encounter: Payer: Self-pay | Admitting: Nurse Practitioner

## 2022-12-10 ENCOUNTER — Ambulatory Visit: Payer: Medicare HMO | Attending: Nurse Practitioner | Admitting: Nurse Practitioner

## 2022-12-10 VITALS — BP 126/66 | HR 66 | Ht 62.0 in | Wt 112.0 lb

## 2022-12-10 DIAGNOSIS — I4719 Other supraventricular tachycardia: Secondary | ICD-10-CM

## 2022-12-10 DIAGNOSIS — E039 Hypothyroidism, unspecified: Secondary | ICD-10-CM

## 2022-12-10 DIAGNOSIS — I10 Essential (primary) hypertension: Secondary | ICD-10-CM | POA: Diagnosis not present

## 2022-12-10 DIAGNOSIS — I480 Paroxysmal atrial fibrillation: Secondary | ICD-10-CM

## 2022-12-10 MED ORDER — APIXABAN 2.5 MG PO TABS: 2.5000 mg | ORAL_TABLET | Freq: Two times a day (BID) | ORAL | 0 refills | Status: DC

## 2022-12-10 MED ORDER — APIXABAN 2.5 MG PO TABS: 2.5000 mg | ORAL_TABLET | Freq: Two times a day (BID) | ORAL | 3 refills | Status: DC

## 2022-12-10 NOTE — Progress Notes (Signed)
Office Visit    Patient Name: Kathleen Santana Date of Encounter: 12/10/2022  Primary Care Provider:  Deloris Ping, MD Primary Cardiologist:  Reatha Harps, MD  Chief Complaint    89 year old female with a history of paroxysmal atrial fibrillation, atrial tachycardia, hypertension, hypothyroidism and GERD who presents for follow-up related to atrial fibrillation.   Past Medical History    Past Medical History:  Diagnosis Date   Afib (HCC)    Arrhythmia    COVID-19    2022   GERD (gastroesophageal reflux disease)    Hypertension    Hypothyroidism    Lower extremity neuropathy    Osteoporosis    Pneumonia    2021   Rhinitis, non-allergic    Past Surgical History:  Procedure Laterality Date   BACK SURGERY     CATARACT EXTRACTION Bilateral    2021   NOSE SURGERY     OPEN REDUCTION INTERNAL FIXATION (ORIF) METACARPAL Left 01/13/2022   Procedure: OPEN REDUCTION INTERNAL FIXATION (ORIF) METACARPAL, LEFT INDEX AND LONG FINGER;  Surgeon: Bradly Bienenstock, MD;  Location: [facility] OR;  Service: Orthopedics;  Laterality: Left;  REGIONAL WITH IV SEDATION    Allergies  Allergies  Allergen Reactions   Nickel Rash   Norco [Hydrocodone-Acetaminophen] Nausea And Vomiting and Other (See Comments)    Passed out, dizziness   Other Other (See Comments) and Cough    Seasonal allergies- congestion and itchy eyes, also     Labs/Other Studies Reviewed    The following studies were reviewed today:  Cardiac Studies & Procedures       ECHOCARDIOGRAM  ECHOCARDIOGRAM COMPLETE 02/29/2020  Narrative ECHOCARDIOGRAM REPORT    Patient Name:   Kathleen Santana Date of Exam: 02/29/2020 Medical Rec #:  [id number]           Height:       62.0 in Accession #:    [id number]          Weight:       119.4 lb Date of Birth:  1926-08-04            BSA:          1.535 m Patient Age:    89 years            BP:           162/82 mmHg Patient Gender: F                   HR:           76  bpm. Exam Location:  [address]  Procedure: 2D Echo, Cardiac Doppler and Color Doppler  Indications:    I48.0 Paroxysmal atrial fibrillation  History:        Patient has no prior history of Echocardiogram examinations. Arrythmias:Paroxysmal atrial fibrillation; Risk Factors:Hypertension and Former Smoker.  Sonographer:    Samule Ohm RDCS Referring Phys: [id number] Ronnald Ramp O'NEAL  IMPRESSIONS   1. Left ventricular ejection fraction, by estimation, is 55 to 60%. The left ventricle has normal function. The left ventricle has no regional wall motion abnormalities. There is moderate asymmetric left ventricular hypertrophy. Left ventricular diastolic function could not be evaluated. 2. Right ventricular systolic function is normal. The right ventricular size is normal. 3. The mitral valve is normal in structure. Mild to moderate mitral valve regurgitation. 4. The aortic valve is normal in structure. Aortic valve regurgitation is not visualized. No aortic stenosis is present.  FINDINGS Left Ventricle: Left ventricular  ejection fraction, by estimation, is 55 to 60%. The left ventricle has normal function. The left ventricle has no regional wall motion abnormalities. The left ventricular internal cavity size was normal in size. There is moderate asymmetric left ventricular hypertrophy. Left ventricular diastolic function could not be evaluated due to atrial fibrillation. Left ventricular diastolic function could not be evaluated.  Right Ventricle: The right ventricular size is normal. No increase in right ventricular wall thickness. Right ventricular systolic function is normal.  Left Atrium: Left atrial size was normal in size.  Right Atrium: Right atrial size was normal in size.  Pericardium: There is no evidence of pericardial effusion.  Mitral Valve: The mitral valve is normal in structure. Mild to moderate mitral valve regurgitation.  Tricuspid Valve: The tricuspid valve  is normal in structure. Tricuspid valve regurgitation is mild.  Aortic Valve: The aortic valve is normal in structure. Aortic valve regurgitation is not visualized. No aortic stenosis is present.  Pulmonic Valve: The pulmonic valve was grossly normal. Pulmonic valve regurgitation is mild to moderate.  Aorta: The aortic root and ascending aorta are structurally normal, with no evidence of dilitation.  IAS/Shunts: The atrial septum is grossly normal.   LEFT VENTRICLE PLAX 2D LVIDd:         3.90 cm  Diastology LVIDs:         2.60 cm  LV e' medial:    4.35 cm/s LV PW:         0.90 cm  LV E/e' medial:  24.8 LV IVS:        1.40 cm  LV e' lateral:   6.96 cm/s LVOT diam:     1.70 cm  LV E/e' lateral: 15.5 LV SV:         39 LV SV Index:   25 LVOT Area:     2.27 cm   RIGHT VENTRICLE             IVC RV S prime:     18.30 cm/s  IVC diam: 1.20 cm TAPSE (M-mode): 2.4 cm RVSP:           29.6 mmHg  LEFT ATRIUM             Index       RIGHT ATRIUM           Index LA diam:        3.70 cm 2.41 cm/m  RA Pressure: 3.00 mmHg LA Vol (A2C):   31.8 ml 20.71 ml/m RA Area:     10.90 cm LA Vol (A4C):   33.9 ml 22.08 ml/m RA Volume:   21.90 ml  14.26 ml/m LA Biplane Vol: 32.4 ml 21.10 ml/m AORTIC VALVE LVOT Vmax:   73.20 cm/s LVOT Vmean:  56.100 cm/s LVOT VTI:    0.170 m  AORTA Ao Root diam: 2.70 cm Ao Asc diam:  3.40 cm  MV E velocity: 108.00 cm/s  TRICUSPID VALVE MV A velocity: 100.00 cm/s  TR Peak grad:   26.6 mmHg MV E/A ratio:  1.08         TR Vmax:        258.00 cm/s Estimated RAP:  3.00 mmHg RVSP:           29.6 mmHg  SHUNTS Systemic VTI:  0.17 m Systemic Diam: 1.70 cm  Kristeen Miss MD Electronically signed by Kristeen Miss MD Signature Date/Time: 02/29/2020/4:23:20 PM    Final    MONITORS  LONG TERM MONITOR (3-14 DAYS) 05/04/2020  Narrative  Enrollment 04/14/2020-04/28/2020 (12 days 15 hours). Patient had a min HR of 38 bpm (sinus bradycardia with isorhythmic  dissociation), max HR of 162 bpm (non-sustained ventricular tachycardia), and avg HR of 69 bpm (normal sinus rhythm). Predominant underlying rhythm was Sinus Rhythm. 3 non-sustained Ventricular Tachycardia runs occurred, the run with the fastest interval lasting 9 beats with a max rate of 162 bpm (avg 108 bpm); the run with the fastest interval was also the longest. Non-sustained ventricular tachycardia events may represent atrial fibrillation with aberrant conduction (irregular rhythm). 103 Supraventricular Tachycardia runs occurred, the run with the fastest interval lasting 4 beats with a max rate of 148 bpm, the longest lasting 19 beats with an avg rate of 100 bpm. Episodes appear to be atrial tachycardia. Atrial Fibrillation occurred (4% burden), ranging from 62-148 bpm (avg of 94 bpm), the longest lasting 13 hours 37 mins with an avg rate of 94 bpm. True duration of Atrial Fibrillation episodes difficult to ascertain due to the presence of artifact. Supraventricular Tachycardia was detected within +/- 45 seconds of symptomatic patient event(s). Isolated SVEs were frequent (6.3%, [id number]), SVE Couplets were rare (<1.0%, 1871), and SVE Triplets were rare (<1.0%, 353). Isolated VEs were rare (<1.0%), VE Couplets were rare (<1.0%), and no VE Triplets were present.  04/19/20 05:30pm short of breath coincided with normal sinus rhythm 97 bpm. There were 2 patient entries without time recorded.  Impression: 1. Paroxysmal Atrial fibrillation detected (4% burden; longest duration 13 hours 37 minutes; average HR 94 bpm). 2. Atrial tachycardia episodes detected (103 episodes in 12 days; longest duration 19 beats). 3. Frequent PACs (6.3%).  Gerri Spore T. Flora Lipps, MD, [facility]  [facility] [address], [address] [place], [place] [address] [phone] 2:27 PM          Recent Labs: 01/10/2022: BUN 27; Creatinine, Ser 1.20; Hemoglobin 10.9; Platelets 263; Potassium 4.1; Sodium 136  Recent Lipid  Panel No results found for: "CHOL", "TRIG", "HDL", "CHOLHDL", "VLDL", "LDLCALC", "LDLDIRECT"  History of Present Illness    89 year old female with a history of paroxysmal atrial fibrillation, atrial tachycardia, hypertension, hypothyroidism and GERD.  Echocardiogram in 2022 showed EF 55 to 60%, normal LV function, no RWMA, moderate asymmetric LVH, normal RV systolic function, mild to moderate mitral valve regurgitation.  Cardiac monitor in 04/2020 showed paroxysmal atrial fibrillation (4% burden), atrial tachycardia, frequent PACs (6.3%).  She is on reduced dose Eliquis.  She was last seen in the office on 06/13/2022 was doing well from a cardiac standpoint.  She was maintaining sinus rhythm.  She presents today for follow-up accompanied by her daughter.  Since her last visit she has done well from a cardiac standpoint.  She denies any palpitations, denies symptoms concerning for angina.  She walks 3 miles 3 days a week.  Overall, she reports feeling well.  Home Medications    Current Outpatient Medications  Medication Sig Dispense Refill   acetaminophen (TYLENOL) 500 MG tablet Take 500 mg by mouth every 6 (six) hours as needed (pain.).     amLODipine (NORVASC) 2.5 MG tablet Take 1 tablet (2.5 mg total) by mouth every evening. 90 tablet 3   Cholecalciferol (VITAMIN D-3 PO) Take 1,000 Units by mouth daily with breakfast.     gabapentin (NEURONTIN) 300 MG capsule Take 300 mg by mouth in the morning, at noon, and at bedtime.     ipratropium (ATROVENT) 0.06 % nasal spray Place 1 spray into both nostrils 3 (three) times daily as needed for rhinitis.  levothyroxine (SYNTHROID) 50 MCG tablet Take 50 mcg by mouth daily before breakfast.     lisinopril (ZESTRIL) 10 MG tablet TAKE ONE TABLET BY MOUTH EVERY MORNING AND TAKE ONE TABLET BY MOUTH EVERY EVENING 180 tablet 3   magnesium oxide (MAG-OX) 400 (240 Mg) MG tablet Take 400 mg by mouth at bedtime.     metoprolol succinate (TOPROL-XL) 25 MG 24 hr  tablet Take 1 tablet (25 mg total) by mouth daily. 90 tablet 3   Multiple Vitamin (MULTIVITAMIN WITH MINERALS) TABS tablet Take 1 tablet by mouth daily with breakfast. One A Day for Women     Multiple Vitamins-Minerals (PRESERVISION AREDS) CAPS Take 1 capsule by mouth in the morning and at bedtime.     omeprazole (PRILOSEC) 20 MG capsule Take 20 mg by mouth daily as needed (before eating spicy/hot foods).      Propylene Glycol, PF, (SYSTANE COMPLETE PF) 0.6 % SOLN Place 1 drop into both eyes 3 (three) times daily as needed (dry/irritated eyes.).     simethicone (MYLICON) 125 MG chewable tablet Chew 125 mg by mouth 2 (two) times daily as needed for flatulence.     apixaban (ELIQUIS) 2.5 MG TABS tablet Take 1 tablet (2.5 mg total) by mouth 2 (two) times daily. 68 tablet 0   apixaban (ELIQUIS) 2.5 MG TABS tablet Take 1 tablet (2.5 mg total) by mouth 2 (two) times daily. 90 tablet 3   No current facility-administered medications for this visit.     Review of Systems    She denies chest pain, palpitations, dyspnea, pnd, orthopnea, n, v, dizziness, syncope, edema, weight gain, or early satiety. All other systems reviewed and are otherwise negative except as noted above.   Physical Exam    VS:  BP 126/66 (BP Location: Left Arm, Patient Position: Sitting, Cuff Size: Normal)   Pulse 66   Ht 5\' 2"  (1.575 m)   Wt 112 lb (50.8 kg)   BMI 20.49 kg/m   GEN: Well nourished, well developed, in no acute distress. HEENT: normal. Neck: Supple, no JVD, carotid bruits, or masses. Cardiac: RRR, no murmurs, rubs, or gallops. No clubbing, cyanosis, edema.  Radials/DP/PT 2+ and equal bilaterally.  Respiratory:  Respirations regular and unlabored, clear to auscultation bilaterally. GI: Soft, nontender, nondistended, BS + x 4. MS: no deformity or atrophy. Skin: warm and dry, no rash. Neuro:  Strength and sensation are intact. Psych: Normal affect.  Accessory Clinical Findings    ECG personally reviewed by  me today - EKG Interpretation Date/Time:  Tuesday December 10 2022 11:30:00 EST Ventricular Rate:  66 PR Interval:  170 QRS Duration:  84 QT Interval:  402 QTC Calculation: 421 R Axis:   -31  Text Interpretation: Normal sinus rhythm with sinus arrhythmia Left axis deviation When compared with ECG of 10-Jan-2022 05:05, PREVIOUS ECG IS PRESENT Confirmed by Bernadene Person ([id number]) on 12/10/2022 11:30:53 AM  - no acute changes.   Lab Results  Component Value Date   WBC 11.6 (H) 01/10/2022   HGB 10.9 (L) 01/10/2022   HCT 32.0 (L) 01/10/2022   MCV 96.3 01/10/2022   PLT 263 01/10/2022   Lab Results  Component Value Date   CREATININE 1.20 (H) 01/10/2022   BUN 27 (H) 01/10/2022   NA 136 01/10/2022   K 4.1 01/10/2022   CL 104 01/10/2022   CO2 21 (L) 01/10/2022   No results found for: "ALT", "AST", "GGT", "ALKPHOS", "BILITOT" No results found for: "CHOL", "HDL", "LDLCALC", "LDLDIRECT", "TRIG", "CHOLHDL"  No results found for: "HGBA1C"  Assessment & Plan   1. Paroxysmal atrial fibrillation/atrial tachycardia: Cardiac monitor in 04/2020 showed paroxysmal atrial fibrillation (4% burden), atrial tachycardia, frequent PACs (6.3%). Echocardiogram in 2022 showed EF 55 to 60%, normal LV function, no RWMA, moderate asymmetric LVH, normal RV systolic function, mild to moderate mitral valve regurgitation.  Maintaining sinus rhythm, denies any recent palpitations.  Denies bleeding on Eliquis.  She is in the donut hole, samples of Eliquis provided in office today.  Most recent labs were stable.  Continue metoprolol, Eliquis.  2. Hypertension: BP well controlled. Continue current antihypertensive regimen.   3. Hypothyroidism: TSH was 2.44 in 07/2022.  Monitor managed per PCP.  4. Disposition: Follow-up in 6 months.      Joylene Grapes, NP 12/10/2022, 12:12 PM

## 2022-12-10 NOTE — Patient Instructions (Signed)
Medication Instructions:  Your physician recommends that you continue on your current medications as directed. Please refer to the Current Medication list given to you today.  *If you need a refill on your cardiac medications before your next appointment, please call your pharmacy*   Lab Work: NONE ordered at this time of appointment    Testing/Procedures: NONE ordered at this time of appointment    Follow-Up: At [facility], you and your health needs are our priority.  As part of our continuing mission to provide you with exceptional heart care, we have created designated Provider Care Teams.  These Care Teams include your primary Cardiologist (physician) and Advanced Practice Providers (APPs -  Physician Assistants and Nurse Practitioners) who all work together to provide you with the care you need, when you need it.  We recommend signing up for the patient portal called "MyChart".  Sign up information is provided on this After Visit Summary.  MyChart is used to connect with patients for Virtual Visits (Telemedicine).  Patients are able to view lab/test results, encounter notes, upcoming appointments, etc.  Non-urgent messages can be sent to your provider as well.   To learn more about what you can do with MyChart, go to [email or web address].    Your next appointment:   6 month(s)  Provider:   Reatha Harps, MD     Other Instructions

## 2022-12-31 DIAGNOSIS — Z961 Presence of intraocular lens: Secondary | ICD-10-CM | POA: Diagnosis not present

## 2022-12-31 DIAGNOSIS — H26492 Other secondary cataract, left eye: Secondary | ICD-10-CM | POA: Diagnosis not present

## 2022-12-31 DIAGNOSIS — H02831 Dermatochalasis of right upper eyelid: Secondary | ICD-10-CM | POA: Diagnosis not present

## 2022-12-31 DIAGNOSIS — H04123 Dry eye syndrome of bilateral lacrimal glands: Secondary | ICD-10-CM | POA: Diagnosis not present

## 2022-12-31 DIAGNOSIS — H02834 Dermatochalasis of left upper eyelid: Secondary | ICD-10-CM | POA: Diagnosis not present

## 2022-12-31 DIAGNOSIS — H353111 Nonexudative age-related macular degeneration, right eye, early dry stage: Secondary | ICD-10-CM | POA: Diagnosis not present

## 2022-12-31 DIAGNOSIS — H02403 Unspecified ptosis of bilateral eyelids: Secondary | ICD-10-CM | POA: Diagnosis not present

## 2022-12-31 DIAGNOSIS — H43813 Vitreous degeneration, bilateral: Secondary | ICD-10-CM | POA: Diagnosis not present

## 2022-12-31 DIAGNOSIS — H353221 Exudative age-related macular degeneration, left eye, with active choroidal neovascularization: Secondary | ICD-10-CM | POA: Diagnosis not present

## 2022-12-31 DIAGNOSIS — H526 Other disorders of refraction: Secondary | ICD-10-CM | POA: Diagnosis not present

## 2023-01-24 DIAGNOSIS — R202 Paresthesia of skin: Secondary | ICD-10-CM | POA: Diagnosis not present

## 2023-01-24 DIAGNOSIS — Z1330 Encounter for screening examination for mental health and behavioral disorders, unspecified: Secondary | ICD-10-CM | POA: Diagnosis not present

## 2023-01-24 DIAGNOSIS — N1831 Chronic kidney disease, stage 3a: Secondary | ICD-10-CM | POA: Diagnosis not present

## 2023-01-24 DIAGNOSIS — G629 Polyneuropathy, unspecified: Secondary | ICD-10-CM | POA: Diagnosis not present

## 2023-01-24 DIAGNOSIS — I480 Paroxysmal atrial fibrillation: Secondary | ICD-10-CM | POA: Diagnosis not present

## 2023-03-25 DIAGNOSIS — S62321A Displaced fracture of shaft of second metacarpal bone, left hand, initial encounter for closed fracture: Secondary | ICD-10-CM | POA: Diagnosis not present

## 2023-03-25 DIAGNOSIS — G5601 Carpal tunnel syndrome, right upper limb: Secondary | ICD-10-CM | POA: Diagnosis not present

## 2023-03-25 DIAGNOSIS — M65331 Trigger finger, right middle finger: Secondary | ICD-10-CM | POA: Diagnosis not present

## 2023-03-26 DIAGNOSIS — R918 Other nonspecific abnormal finding of lung field: Secondary | ICD-10-CM | POA: Diagnosis not present

## 2023-03-26 DIAGNOSIS — R059 Cough, unspecified: Secondary | ICD-10-CM | POA: Diagnosis not present

## 2023-03-26 DIAGNOSIS — J069 Acute upper respiratory infection, unspecified: Secondary | ICD-10-CM | POA: Diagnosis not present

## 2023-03-26 DIAGNOSIS — I480 Paroxysmal atrial fibrillation: Secondary | ICD-10-CM | POA: Diagnosis not present

## 2023-04-01 NOTE — Progress Notes (Unsigned)
 Cardiology Office Note:  .   Date:  04/02/2023  ID:  [name], DOB [birth year], MRN [id number] PCP: Arliss Journey, PA-C  [facility] Providers Cardiologist:  Reatha Harps, MD {   History of Present Illness: .    Chief Complaint  Patient presents with   Follow-up         Kathleen Santana is a 89 y.o. female with history of pAF, HTN, HLD who presents for follow-up.    History of Present Illness   ROSIO Santana is a 89 year old female with paroxysmal atrial fibrillation and hypertension who presents for follow-up. She is accompanied by her son, Riki Santana. She was advised by Nehemiah Settle to see her cardiologist due to heart concerns.  Her heart rate has been fluctuating, with episodes of feeling unwell starting approximately two weeks ago. On March 11, she received a cortisone injection in her hand, after which she experienced difficulty completing her usual walk, having to cut it short to two miles. Since then, her heart rate has been variable. She has been experiencing fatigue and shortness of breath. Her son noted that her heart rate on her watch ECG was as high as 174 bpm before he arrived to check on her. She has previously tried amiodarone but experienced lightheadedness as a side effect.  She reports a recent history of congestion and was prescribed an antibiotic, suspecting a possible infection. She completed a chest x-ray which showed scarring in the lungs. No recent fever or significant illness, but she mentions feeling 'junky' and having wheezing sounds in her lungs. She has not had a CT scan of her chest recently.  She is currently on Eliquis 2.5 mg twice daily and metoprolol succinate 25 mg daily. She has not missed any doses of Eliquis.  She reports occasional episodes where her fingers turn blue or white, but these are not painful. She experiences transient sensations of 'ice water' running in her veins, which are brief and not persistent.           Problem List 1. Paroxysmal Afib -Afib with RVR 04/06/2020 -4% Afib burden -CHADSVASC=4 (age, sex, HTN) 2. Atrial tachycardia/PACs -6.3% PAC burden 3. HTN 4. HLD 5. Hypothyroidism     ROS: All other ROS reviewed and negative. Pertinent positives noted in the HPI.     Studies Reviewed: Marland Kitchen   EKG Interpretation Date/Time:  Wednesday April 02 2023 13:16:47 EDT Ventricular Rate:  133 PR Interval:  152 QRS Duration:  80 QT Interval:  290 QTC Calculation: 431 R Axis:   -24  Text Interpretation: Atrial flutter Septal infarct , age undetermined Confirmed by Lennie Odor ([id number]) on 04/02/2023 1:22:22 PM    [facility] 05/14/2020 Impression: 1. Paroxysmal Atrial fibrillation detected (4% burden; longest duration 13 hours 37 minutes; average HR 94 bpm).  2. Atrial tachycardia episodes detected (103 episodes in 12 days; longest duration 19 beats).  3. Frequent PACs (6.3%).  Physical Exam:   VS:  BP 116/84   Pulse (!) 133   Ht 5' 2.4" (1.585 m)   Wt 112 lb (50.8 kg)   SpO2 98%   BMI 20.22 kg/m    Wt Readings from Last 3 Encounters:  04/02/23 112 lb (50.8 kg)  12/10/22 112 lb (50.8 kg)  06/13/22 115 lb 3.2 oz (52.3 kg)    GEN: Well nourished, well developed in no acute distress NECK: No JVD; No carotid bruits CARDIAC: tachycardia noted, no murmurs, rubs, gallops RESPIRATORY:  Clear to auscultation without rales,  wheezing or rhonchi  ABDOMEN: Soft, non-tender, non-distended EXTREMITIES:  No edema; No deformity  ASSESSMENT AND PLAN: .   Assessment and Plan    Atrial Flutter Paroxysmal Afib  Atrial flutter related to paroxysmal atrial fibrillation. Suspected bronchitis may have triggered arrhythmia. Difficult to rate control. Cardioversion recommended. Discussed rhythm control strategy with possible amiodarone if needed. Given age, favor medical management  - Schedule cardioversion, preferably Tuesday. - Order CBC, TSH, CMP. - Increase metoprolol succinate to 50 mg bid. - Repeat  echocardiogram post-cardioversion. - Discuss rhythm control strategy with possible amiodarone at follow-up. - Consider future pacemaker implant if necessary.  Bronchitis Bronchitis may contribute to atrial flutter. Congestion present, on antibiotics with one day remaining. Wheezing and snoring sounds noted. - Recommend follow-up with primary care or pulmonary specialist if symptoms persist post-antibiotics. - Consider chest CT if no improvement.  Hypertension Hypertension with blood pressure fluctuations. On metoprolol succinate. - Increase metoprolol succinate to 50 mg bid. - Advise monitoring blood pressure, maintaining hydration and nutrition.          Informed Consent   Shared Decision Making/Informed Consent The risks (stroke, cardiac arrhythmias rarely resulting in the need for a temporary or permanent pacemaker, skin irritation or burns and complications associated with conscious sedation including aspiration, arrhythmia, respiratory failure and death), benefits (restoration of normal sinus rhythm) and alternatives of a direct current cardioversion were explained in detail to Ms. Levandoski and she agrees to proceed.        Follow-up: Return in about 2 weeks (around 04/16/2023).  Signed, Lenna Gilford. Flora Lipps, MD, [facility]  [facility]  [address], [address] [place], [place] [address] [phone]  2:03 PM

## 2023-04-01 NOTE — H&P (View-Only) (Signed)
 Cardiology Office Note:  .   Date:  04/02/2023  ID:  Kathleen Santana, DOB [birth year], MRN [id number] PCP: Kathleen Journey, PA-C  [facility] Providers Cardiologist:  Kathleen Harps, MD {   History of Present Illness: .    Chief Complaint  Patient presents with   Follow-up         Kathleen Santana is a 89 y.o. female with history of pAF, HTN, HLD who presents for follow-up.    History of Present Illness   Kathleen Santana is a 89 year old female with paroxysmal atrial fibrillation and hypertension who presents for follow-up. She is accompanied by her son, Kathleen Santana. She was advised by Kathleen Santana to see her cardiologist due to heart concerns.  Her heart rate has been fluctuating, with episodes of feeling unwell starting approximately two weeks ago. On March 11, she received a cortisone injection in her hand, after which she experienced difficulty completing her usual walk, having to cut it short to two miles. Since then, her heart rate has been variable. She has been experiencing fatigue and shortness of breath. Her son noted that her heart rate on her watch ECG was as high as 174 bpm before he arrived to check on her. She has previously tried amiodarone but experienced lightheadedness as a side effect.  She reports a recent history of congestion and was prescribed an antibiotic, suspecting a possible infection. She completed a chest x-ray which showed scarring in the lungs. No recent fever or significant illness, but she mentions feeling 'junky' and having wheezing sounds in her lungs. She has not had a CT scan of her chest recently.  She is currently on Eliquis 2.5 mg twice daily and metoprolol succinate 25 mg daily. She has not missed any doses of Eliquis.  She reports occasional episodes where her fingers turn blue or white, but these are not painful. She experiences transient sensations of 'ice water' running in her veins, which are brief and not persistent.           Problem List 1. Paroxysmal Afib -Afib with RVR 04/06/2020 -4% Afib burden -CHADSVASC=4 (age, sex, HTN) 2. Atrial tachycardia/PACs -6.3% PAC burden 3. HTN 4. HLD 5. Hypothyroidism     ROS: All other ROS reviewed and negative. Pertinent positives noted in the HPI.     Studies Reviewed: Marland Kitchen   EKG Interpretation Date/Time:  Wednesday April 02 2023 13:16:47 EDT Ventricular Rate:  133 PR Interval:  152 QRS Duration:  80 QT Interval:  290 QTC Calculation: 431 R Axis:   -24  Text Interpretation: Atrial flutter Septal infarct , age undetermined Confirmed by Lennie Odor ([id number]) on 04/02/2023 1:22:22 PM    Zio 05/14/2020 Impression: 1. Paroxysmal Atrial fibrillation detected (4% burden; longest duration 13 hours 37 minutes; average HR 94 bpm).  2. Atrial tachycardia episodes detected (103 episodes in 12 days; longest duration 19 beats).  3. Frequent PACs (6.3%).  Physical Exam:   VS:  BP 116/84   Pulse (!) 133   Ht 5' 2.4" (1.585 m)   Wt 112 lb (50.8 kg)   SpO2 98%   BMI 20.22 kg/m    Wt Readings from Last 3 Encounters:  04/02/23 112 lb (50.8 kg)  12/10/22 112 lb (50.8 kg)  06/13/22 115 lb 3.2 oz (52.3 kg)    GEN: Well nourished, well developed in no acute distress NECK: No JVD; No carotid bruits CARDIAC: tachycardia noted, no murmurs, rubs, gallops RESPIRATORY:  Clear to auscultation without rales,  wheezing or rhonchi  ABDOMEN: Soft, non-tender, non-distended EXTREMITIES:  No edema; No deformity  ASSESSMENT AND PLAN: .   Assessment and Plan    Atrial Flutter Paroxysmal Afib  Atrial flutter related to paroxysmal atrial fibrillation. Suspected bronchitis may have triggered arrhythmia. Difficult to rate control. Cardioversion recommended. Discussed rhythm control strategy with possible amiodarone if needed. Given age, favor medical management  - Schedule cardioversion, preferably Tuesday. - Order CBC, TSH, CMP. - Increase metoprolol succinate to 50 mg bid. - Repeat  echocardiogram post-cardioversion. - Discuss rhythm control strategy with possible amiodarone at follow-up. - Consider future pacemaker implant if necessary.  Bronchitis Bronchitis may contribute to atrial flutter. Congestion present, on antibiotics with one day remaining. Wheezing and snoring sounds noted. - Recommend follow-up with primary care or pulmonary specialist if symptoms persist post-antibiotics. - Consider chest CT if no improvement.  Hypertension Hypertension with blood pressure fluctuations. On metoprolol succinate. - Increase metoprolol succinate to 50 mg bid. - Advise monitoring blood pressure, maintaining hydration and nutrition.          Informed Consent   Shared Decision Making/Informed Consent The risks (stroke, cardiac arrhythmias rarely resulting in the need for a temporary or permanent pacemaker, skin irritation or burns and complications associated with conscious sedation including aspiration, arrhythmia, respiratory failure and death), benefits (restoration of normal sinus rhythm) and alternatives of a direct current cardioversion were explained in detail to Kathleen Santana and she agrees to proceed.        Follow-up: Return in about 2 weeks (around 04/16/2023).  Signed, Kathleen Santana. Kathleen Lipps, MD, [facility]  [facility]  [address], [address] [place], [place] [address] [phone]  2:03 PM

## 2023-04-02 ENCOUNTER — Ambulatory Visit: Attending: Cardiovascular Disease | Admitting: Cardiovascular Disease

## 2023-04-02 ENCOUNTER — Encounter: Payer: Self-pay | Admitting: Cardiovascular Disease

## 2023-04-02 VITALS — BP 116/84 | HR 133 | Ht 62.4 in | Wt 112.0 lb

## 2023-04-02 DIAGNOSIS — J40 Bronchitis, not specified as acute or chronic: Secondary | ICD-10-CM | POA: Diagnosis not present

## 2023-04-02 DIAGNOSIS — I480 Paroxysmal atrial fibrillation: Secondary | ICD-10-CM

## 2023-04-02 DIAGNOSIS — I10 Essential (primary) hypertension: Secondary | ICD-10-CM

## 2023-04-02 DIAGNOSIS — I4719 Other supraventricular tachycardia: Secondary | ICD-10-CM | POA: Diagnosis not present

## 2023-04-02 DIAGNOSIS — Z01812 Encounter for preprocedural laboratory examination: Secondary | ICD-10-CM

## 2023-04-02 MED ORDER — METOPROLOL SUCCINATE ER 25 MG PO TB24: 25.0000 mg | ORAL_TABLET | Freq: Two times a day (BID) | ORAL | 3 refills | Status: DC

## 2023-04-02 NOTE — Patient Instructions (Signed)
 Medication Instructions:  - START METOPROLOL SUCCINATE 25 MG TWICE DAILY    *If you need a refill on your cardiac medications before your next appointment, please call your pharmacy*   Lab Work: CBC CMET TSH   If you have labs (blood work) drawn today and your tests are completely normal, you will receive your results only by: MyChart Message (if you have MyChart) OR A paper copy in the mail If you have any lab test that is abnormal or we need to change your treatment, we will call you to review the results.   Testing/Procedures: Echo will be scheduled at [address].  Your physician has requested that you have an echocardiogram. Echocardiography is a painless test that uses sound waves to create images of your heart. It provides your doctor with information about the size and shape of your heart and how well your heart's chambers and valves are working. This procedure takes approximately one hour. There are no restrictions for this procedure. Please do NOT wear cologne, perfume, aftershave, or lotions (deodorant is allowed). Please arrive 15 minutes prior to your appointment time.     Follow-Up: At [facility], you and your health needs are our priority.  As part of our continuing mission to provide you with exceptional heart care, we have created designated Provider Care Teams.  These Care Teams include your primary Cardiologist (physician) and Advanced Practice Providers (APPs -  Physician Assistants and Nurse Practitioners) who all work together to provide you with the care you need, when you need it.  We recommend signing up for the patient portal called "MyChart".  Sign up information is provided on this After Visit Summary.  MyChart is used to connect with patients for Virtual Visits (Telemedicine).  Patients are able to view lab/test results, encounter notes, upcoming appointments, etc.  Non-urgent messages can be sent to your provider as well.   To learn more  about what you can do with MyChart, go to [email or web address].    Your next appointment:   3 week(s)  The format for your next appointment:   In Person  Provider:   Reatha Harps, MD    Other Instructions      Dear Kathleen Santana  You are scheduled for a Cardioversion on Tuesday, March 25 with Dr. Anne Fu.  Please arrive at the [facility] (Main Entrance A) at [facility]: [address] [place], [place] [address] at 9:00 AM (This time is 1 hour(s) before your procedure to ensure your preparation).   Free valet parking service is available. You will check in at ADMITTING.   *Please Note: You will receive a call the day before your procedure to confirm the appointment time. That time may have changed from the original time based on the schedule for that day.*    DIET:  Nothing to eat or drink after midnight except a sip of water with medications (see medication instructions below)  MEDICATION INSTRUCTIONS: !!IF ANY NEW MEDICATIONS ARE STARTED AFTER TODAY, PLEASE NOTIFY YOUR PROVIDER AS SOON AS POSSIBLE!!  FYI: Medications such as Semaglutide (Ozempic, [place]), Tirzepatide (Mounjaro, Zepbound), Dulaglutide (Trulicity), etc ("GLP1 agonists") AND Canagliflozin (Invokana), Dapagliflozin (Farxiga), Empagliflozin (Jardiance), Ertugliflozin (Steglatro), Bexagliflozin [employer]) or any combination with one of these drugs such as Invokamet (Canagliflozin/Metformin), Synjardy (Empagliflozin/Metformin), etc ("SGLT2 inhibitors") must be held around the time of a procedure. This is not a comprehensive list of all of these drugs. Please review all of your medications and talk to  your provider if you take any one of these. If you are not sure, ask your provider.     Continue taking your anticoagulant (blood thinner): Apixaban (Eliquis).  You will need to continue this after your procedure until you are told by your provider that it is safe to stop.    LABS:   Come to the lab at  [facility] at [address] between the hours of 8:00 am and 4:30 pm. You do NOT have to be fasting.  FYI:  For your safety, and to allow [place] to monitor your vital signs accurately during the surgery/procedure we request: If you have artificial nails, gel coating, SNS etc, please have those removed prior to your surgery/procedure. Not having the nail coverings /polish removed may result in cancellation or delay of your surgery/procedure.  Your support person will be asked to wait in the waiting room during your procedure.  It is OK to have someone drop you off and come back when you are ready to be discharged.  You cannot drive after the procedure and will need someone to drive you home.  Bring your insurance cards.  *Special Note: Every effort is made to have your procedure done on time. Occasionally there are emergencies that occur at the hospital that may cause delays. Please be patient if a delay does occur.

## 2023-04-03 ENCOUNTER — Ambulatory Visit: Admitting: Emergency Medicine

## 2023-04-03 ENCOUNTER — Encounter: Payer: Self-pay | Admitting: Cardiovascular Disease

## 2023-04-03 LAB — CBC
Hematocrit: 37 % (ref 34.0–46.6)
Hemoglobin: 12.4 g/dL (ref 11.1–15.9)
MCH: 31.2 pg (ref 26.6–33.0)
MCHC: 33.5 g/dL (ref 31.5–35.7)
MCV: 93 fL (ref 79–97)
Platelets: 280 10*3/uL (ref 150–450)
RBC: 3.98 x10E6/uL (ref 3.77–5.28)
RDW: 13.2 % (ref 11.7–15.4)
WBC: 10.7 10*3/uL (ref 3.4–10.8)

## 2023-04-03 LAB — COMPREHENSIVE METABOLIC PANEL
ALT: 17 IU/L (ref 0–32)
AST: 20 IU/L (ref 0–40)
Albumin: 4.3 g/dL (ref 3.6–4.6)
Alkaline Phosphatase: 85 IU/L (ref 44–121)
BUN/Creatinine Ratio: 22 (ref 12–28)
BUN: 26 mg/dL (ref 10–36)
Bilirubin Total: 0.3 mg/dL (ref 0.0–1.2)
CO2: 23 mmol/L (ref 20–29)
Calcium: 9.8 mg/dL (ref 8.7–10.3)
Chloride: 99 mmol/L (ref 96–106)
Creatinine, Ser: 1.18 mg/dL — ABNORMAL HIGH (ref 0.57–1.00)
Globulin, Total: 2.8 g/dL (ref 1.5–4.5)
Glucose: 89 mg/dL (ref 70–99)
Potassium: 5.3 mmol/L — ABNORMAL HIGH (ref 3.5–5.2)
Sodium: 136 mmol/L (ref 134–144)
Total Protein: 7.1 g/dL (ref 6.0–8.5)
eGFR: 43 mL/min/{1.73_m2} — ABNORMAL LOW (ref >59)

## 2023-04-03 LAB — TSH: TSH: 2.2 u[IU]/mL (ref 0.450–4.500)

## 2023-04-07 DIAGNOSIS — L820 Inflamed seborrheic keratosis: Secondary | ICD-10-CM | POA: Diagnosis not present

## 2023-04-07 DIAGNOSIS — L300 Nummular dermatitis: Secondary | ICD-10-CM | POA: Diagnosis not present

## 2023-04-07 DIAGNOSIS — L578 Other skin changes due to chronic exposure to nonionizing radiation: Secondary | ICD-10-CM | POA: Diagnosis not present

## 2023-04-07 DIAGNOSIS — D225 Melanocytic nevi of trunk: Secondary | ICD-10-CM | POA: Diagnosis not present

## 2023-04-07 DIAGNOSIS — L814 Other melanin hyperpigmentation: Secondary | ICD-10-CM | POA: Diagnosis not present

## 2023-04-07 NOTE — Progress Notes (Signed)
 Spoke to pt's daughter Eunice Blase and instructed them to come at 0900 and to be NPO after 0000.  Confirmed no missed doses of AC and instructed to take in AM with a small sip of water.  Confirmed that pt will have a ride home and someone to stay with them for 24 hours after the procedure. Instructed patient to not wear any jewelry or lotion.

## 2023-04-08 ENCOUNTER — Ambulatory Visit (HOSPITAL_COMMUNITY): Payer: Self-pay

## 2023-04-08 ENCOUNTER — Encounter (HOSPITAL_COMMUNITY)
Admission: RE | Disposition: A | Discharge status: Home / Self Care | Payer: Self-pay | Source: Home / Self Care | Attending: Cardiology

## 2023-04-08 ENCOUNTER — Ambulatory Visit (HOSPITAL_COMMUNITY)
Admission: RE | Admit: 2023-04-08 | Discharge: 2023-04-08 | Disposition: A | Discharge status: Home / Self Care | Attending: Cardiology | Admitting: Cardiology

## 2023-04-08 ENCOUNTER — Other Ambulatory Visit: Payer: Self-pay

## 2023-04-08 DIAGNOSIS — I4891 Unspecified atrial fibrillation: Secondary | ICD-10-CM | POA: Diagnosis not present

## 2023-04-08 DIAGNOSIS — Z79899 Other long term (current) drug therapy: Secondary | ICD-10-CM | POA: Diagnosis not present

## 2023-04-08 DIAGNOSIS — E039 Hypothyroidism, unspecified: Secondary | ICD-10-CM | POA: Insufficient documentation

## 2023-04-08 DIAGNOSIS — I10 Essential (primary) hypertension: Secondary | ICD-10-CM

## 2023-04-08 DIAGNOSIS — Z7901 Long term (current) use of anticoagulants: Secondary | ICD-10-CM | POA: Insufficient documentation

## 2023-04-08 DIAGNOSIS — Z87891 Personal history of nicotine dependence: Secondary | ICD-10-CM

## 2023-04-08 DIAGNOSIS — I4819 Other persistent atrial fibrillation: Secondary | ICD-10-CM

## 2023-04-08 DIAGNOSIS — I483 Typical atrial flutter: Secondary | ICD-10-CM | POA: Insufficient documentation

## 2023-04-08 DIAGNOSIS — I480 Paroxysmal atrial fibrillation: Secondary | ICD-10-CM | POA: Diagnosis not present

## 2023-04-08 DIAGNOSIS — J40 Bronchitis, not specified as acute or chronic: Secondary | ICD-10-CM | POA: Insufficient documentation

## 2023-04-08 SURGERY — CARDIOVERSION (CATH LAB)
Anesthesia: General

## 2023-04-08 MED ORDER — SODIUM CHLORIDE 0.9 % IV SOLN: INTRAVENOUS | Status: DC

## 2023-04-08 MED ORDER — SODIUM CHLORIDE 0.9% FLUSH
INTRAVENOUS | Status: DC | PRN
  Administered 2023-04-08: 5 mL via INTRAVENOUS

## 2023-04-08 MED ORDER — LIDOCAINE 2% (20 MG/ML) 5 ML SYRINGE
INTRAMUSCULAR | Status: DC | PRN
  Administered 2023-04-08: 40 mg via INTRAVENOUS

## 2023-04-08 MED ORDER — PROPOFOL 10 MG/ML IV BOLUS
INTRAVENOUS | Status: DC | PRN
  Administered 2023-04-08: 40 mg via INTRAVENOUS

## 2023-04-08 SURGICAL SUPPLY — 1 items: PAD DEFIB RADIO PHYSIO CONN (PAD) ×1 IMPLANT

## 2023-04-08 NOTE — Discharge Instructions (Signed)

## 2023-04-08 NOTE — Anesthesia Preprocedure Evaluation (Addendum)
 Anesthesia Evaluation  Patient identified by MRN, date of birth, ID band Patient awake    Reviewed: Allergy & Precautions, NPO status , Patient's Chart, lab work & pertinent test results  Airway Mallampati: II  TM Distance: >3 FB Neck ROM: Full    Dental  (+) Teeth Intact, Dental Advisory Given   Pulmonary former smoker   breath sounds clear to auscultation       Cardiovascular hypertension, Pt. on medications and Pt. on home beta blockers + dysrhythmias Atrial Fibrillation  Rhythm:Irregular Rate:Abnormal  Echo:   1. Left ventricular ejection fraction, by estimation, is 55 to 60%. The  left ventricle has normal function. The left ventricle has no regional  wall motion abnormalities. There is moderate asymmetric left ventricular  hypertrophy. Left ventricular  diastolic function could not be evaluated.   2. Right ventricular systolic function is normal. The right ventricular  size is normal.   3. The mitral valve is normal in structure. Mild to moderate mitral valve  regurgitation.   4. The aortic valve is normal in structure. Aortic valve regurgitation is  not visualized. No aortic stenosis is present.     Neuro/Psych  Neuromuscular disease    GI/Hepatic Neg liver ROS,GERD  ,,  Endo/Other  Hypothyroidism    Renal/GU negative Renal ROS     Musculoskeletal negative musculoskeletal ROS (+)    Abdominal   Peds  Hematology negative hematology ROS (+)   Anesthesia Other Findings   Reproductive/Obstetrics                             Anesthesia Physical Anesthesia Plan  ASA: 3  Anesthesia Plan: General   Post-op Pain Management: Minimal or no pain anticipated   Induction: Intravenous  PONV Risk Score and Plan: Propofol infusion  Airway Management Planned: Natural Airway  Additional Equipment: None  Intra-op Plan:   Post-operative Plan:   Informed Consent: I have reviewed the  patients History and Physical, chart, labs and discussed the procedure including the risks, benefits and alternatives for the proposed anesthesia with the patient or authorized representative who has indicated his/her understanding and acceptance.       Plan Discussed with: CRNA  Anesthesia Plan Comments:         Anesthesia Quick Evaluation

## 2023-04-08 NOTE — Transfer of Care (Signed)
 Immediate Anesthesia Transfer of Care Note  Patient: Kathleen Santana  Procedure(s) Performed: CARDIOVERSION  Patient Location: PACU and Cath Lab  Anesthesia Type:MAC  Level of Consciousness: drowsy  Airway & Oxygen Therapy: Patient Spontanous Breathing and Patient connected to face mask oxygen  Post-op Assessment: Report given to RN and Post -op Vital signs reviewed and stable  Post vital signs: Reviewed and stable  Last Vitals:  Vitals Value Taken Time  BP/ 104/62 04/08/23 1012  Temp    Pulse 61 04/08/23 1012  Resp 13 04/08/23 1012  SpO2 100% 04/08/23 1012    Last Pain:  Vitals:   04/08/23 0901  TempSrc: Temporal         Complications: No notable events documented.

## 2023-04-08 NOTE — Interval H&P Note (Signed)
 History and Physical Interval Note:  04/08/2023 9:42 AM  Kathleen Santana  has presented today for surgery, with the diagnosis of atrial flutter.  The various methods of treatment have been discussed with the patient and family. After consideration of risks, benefits and other options for treatment, the patient has consented to  Procedure(s): CARDIOVERSION (N/A) as a surgical intervention.  The patient's history has been reviewed, patient examined, no change in status, stable for surgery.  I have reviewed the patient's chart and labs.  Questions were answered to the patient's satisfaction.     [employer]

## 2023-04-08 NOTE — Anesthesia Postprocedure Evaluation (Signed)
 Anesthesia Post Note  Patient: Kathleen Santana  Procedure(s) Performed: CARDIOVERSION     Patient location during evaluation: PACU Anesthesia Type: General Level of consciousness: awake and alert Pain management: pain level controlled Vital Signs Assessment: post-procedure vital signs reviewed and stable Respiratory status: spontaneous breathing, nonlabored ventilation, respiratory function stable and patient connected to nasal cannula oxygen Cardiovascular status: blood pressure returned to baseline and stable Postop Assessment: no apparent nausea or vomiting Anesthetic complications: no  No notable events documented.  Last Vitals:  Vitals:   04/08/23 1035 04/08/23 1040  BP: 105/70 113/62  Pulse: (!) 59 (!) 57  Resp: (!) 25 (!) 21  Temp:    SpO2: 95% 97%    Last Pain:  Vitals:   04/08/23 0901  TempSrc: Temporal                 Shelton Silvas

## 2023-04-08 NOTE — CV Procedure (Signed)
    Electrical Cardioversion Procedure Note Kathleen Santana [id number] 10-Jan-1927  Procedure: Electrical Cardioversion Indications:  Atrial Fibrillation  Time Out: Verified patient identification, verified procedure,medications/allergies/relevent history reviewed, required imaging and test results available.  Performed  Procedure Details  The patient was NPO after midnight. Anesthesia was administered at the beside  by Dr.Hollis with propofol.  Cardioversion was performed with synchronized biphasic defibrillation via AP pads with 200 joules.  1 attempt(s) were performed.  The patient converted to normal sinus rhythm. The patient tolerated the procedure well   IMPRESSION:  Successful cardioversion of atrial fibrillation    Kathleen Santana 04/08/2023, 10:08 AM

## 2023-04-09 ENCOUNTER — Encounter (HOSPITAL_COMMUNITY): Payer: Self-pay | Admitting: Cardiology

## 2023-04-15 DIAGNOSIS — R0989 Other specified symptoms and signs involving the circulatory and respiratory systems: Secondary | ICD-10-CM | POA: Diagnosis not present

## 2023-04-15 DIAGNOSIS — I480 Paroxysmal atrial fibrillation: Secondary | ICD-10-CM | POA: Diagnosis not present

## 2023-04-15 DIAGNOSIS — R000 Tachycardia, unspecified: Secondary | ICD-10-CM | POA: Diagnosis not present

## 2023-04-15 DIAGNOSIS — J069 Acute upper respiratory infection, unspecified: Secondary | ICD-10-CM | POA: Diagnosis not present

## 2023-04-15 DIAGNOSIS — R062 Wheezing: Secondary | ICD-10-CM | POA: Diagnosis not present

## 2023-04-29 ENCOUNTER — Ambulatory Visit (HOSPITAL_COMMUNITY): Attending: Cardiology

## 2023-04-29 ENCOUNTER — Encounter: Payer: Self-pay | Admitting: Cardiovascular Disease

## 2023-04-29 DIAGNOSIS — I480 Paroxysmal atrial fibrillation: Secondary | ICD-10-CM | POA: Diagnosis not present

## 2023-04-29 LAB — ECHOCARDIOGRAM COMPLETE
Area-P 1/2: 4.77 cm2
S' Lateral: 2.5 cm

## 2023-04-30 ENCOUNTER — Other Ambulatory Visit: Payer: Self-pay

## 2023-04-30 MED ORDER — METOPROLOL SUCCINATE ER 50 MG PO TB24: 50.0000 mg | ORAL_TABLET | Freq: Two times a day (BID) | ORAL | 3 refills | Status: DC

## 2023-05-01 NOTE — Progress Notes (Signed)
 Cardiology Office Note:  .   Date:  05/02/2023  ID:  Kathleen Santana, DOB December 18, 1926, MRN [id number] PCP: Vladimir Groves, PA-C  [facility] Providers Cardiologist:  Oneil Bigness, MD { History of Present Illness: .    Chief Complaint  Patient presents with   Follow-up         Kathleen Santana is a 89 y.o. female with history of pAF, HTN, HLD who presents for follow-up.   History of Present Illness   Kathleen Santana is a 89 year old female with paroxysmal atrial fibrillation who presents for follow-up.  She has a history of paroxysmal atrial fibrillation and recently underwent cardioversion, which was initially successful but only lasted for one day. She experiences early recurrence of arrhythmia and atrial fibrillation today with a pulse of 114. She describes having 'good and bad days' in terms of energy levels, with some days feeling capable and others feeling down.  She has previously been on amiodarone  and Multaq  (dronedarone ) for rhythm control but does not recall the effects of Multaq . She experienced dizziness and faintness with amiodarone . She is currently on Eliquis  for anticoagulation.  She experiences episodes of her fingers turning blue or white, indicative of Raynaud's phenomenon, associated with her use of metoprolol .  She has been experiencing bronchitis with persistent symptoms despite two rounds of antibiotics. She has shortness of breath, which she attributes to both her bronchitis and atrial fibrillation. She has not been using inhalers.  Her blood pressure readings have been inconsistent, with the systolic pressure ranging from 116 to 130. She questions the accuracy of her home blood pressure cuff and is concerned about a diastolic pressure reading of 103, which was explained as likely a cuff error.          Problem List 1. Paroxysmal Afib -Afib with RVR 04/06/2020 -4% Afib burden -CHADSVASC=4 (age, sex, HTN) -DCCV 04/08/2023 2. Atrial  tachycardia/PACs -6.3% PAC burden 3. HTN 4. HLD 5. Hypothyroidism     ROS: All other ROS reviewed and negative. Pertinent positives noted in the HPI.     Studies Reviewed: Aaron Aas       TTE 04/29/2023  1. Left ventricular ejection fraction, by estimation, is 50 to 55%. The  left ventricle has low normal function. The left ventricle demonstrates  global hypokinesis. There is mild concentric left ventricular hypertrophy.  Left ventricular diastolic  function could not be evaluated.   2. Right ventricular systolic function is normal. The right ventricular  size is normal. There is normal pulmonary artery systolic pressure. The  estimated right ventricular systolic pressure is 24.3 mmHg.   3. Left atrial size was mildly dilated.   4. Right atrial size was severely dilated.   5. The mitral valve is normal in structure. Moderate mitral valve  regurgitation. No evidence of mitral stenosis.   6. Tricuspid valve regurgitation is moderate to severe.   7. The aortic valve is tricuspid. Aortic valve regurgitation is not  visualized. Aortic valve sclerosis/calcification is present, without any  evidence of aortic stenosis.   8. The inferior vena cava is normal in size with greater than 50%  respiratory variability, suggesting right atrial pressure of 3 mmHg.  Physical Exam:   VS:  BP 116/76   Pulse (!) 114   Ht 5\' 2"  (1.575 m)   Wt 117 lb (53.1 kg)   SpO2 100%   BMI 21.40 kg/m    Wt Readings from Last 3 Encounters:  05/02/23 117 lb (53.1 kg)  04/08/23 112 lb (50.8 kg)  04/02/23 112 lb (50.8 kg)    GEN: Well nourished, well developed in no acute distress NECK: No JVD; No carotid bruits CARDIAC: irregular rhythm, no murmurs, rubs, gallops RESPIRATORY: + rhonchi ABDOMEN: Soft, non-tender, non-distended EXTREMITIES:  No edema; No deformity  ASSESSMENT AND PLAN: .   Assessment and Plan    Persistent Atrial Fibrillation Early recurrence post-cardioversion with exertional dyspnea. Sinus  rhythm improves symptoms. Age limits aggressive procedures, but sinus rhythm restoration is acceptable. Discussed anesthesia risks and recovery dynamics. If amiodarone  fails, consider rate control or pacemaker. - Discontinue metoprolol  due to Raynaud's phenomenon. - Prescribe diltiazem  extended release 240 mg daily. - Initiate amiodarone  200 mg twice daily for 7 days, then 200 mg daily. - Continue Eliquis . - Plan for repeat cardioversion in two weeks. - Order pre-procedure BMP and CBC.  Bronchitis Bronchitis may contribute to dyspnea and affect atrial fibrillation. Discussed albuterol  inhaler benefits and potential pulmonary referral. - Prescribe albuterol  inhaler as needed. - Consider referral to a pulmonary specialist if symptoms persist.  Hypertension Blood pressure generally well-controlled. Concerns about home cuff accuracy. Stopped certain antihypertensives to prevent hypotension with new regimen. - Discontinue amlodipine  and lisinopril  to prevent hypotension with new medications.          Informed Consent   Shared Decision Making/Informed Consent The risks (stroke, cardiac arrhythmias rarely resulting in the need for a temporary or permanent pacemaker, skin irritation or burns and complications associated with conscious sedation including aspiration, arrhythmia, respiratory failure and death), benefits (restoration of normal sinus rhythm) and alternatives of a direct current cardioversion were explained in detail to Ms. Esbenshade and she agrees to proceed.        Follow-up: Return in about 6 weeks (around 06/13/2023).  Signed, Gigi Kyle. Rolm Clos, MD, [facility]  [facility]  [address] [place], [place] [address] [phone]  2:22 PM

## 2023-05-02 ENCOUNTER — Encounter: Payer: Self-pay | Admitting: Cardiovascular Disease

## 2023-05-02 ENCOUNTER — Other Ambulatory Visit: Payer: Self-pay | Admitting: *Deleted

## 2023-05-02 ENCOUNTER — Telehealth: Payer: Self-pay | Admitting: Cardiovascular Disease

## 2023-05-02 ENCOUNTER — Ambulatory Visit: Attending: Cardiovascular Disease | Admitting: Cardiovascular Disease

## 2023-05-02 VITALS — BP 116/76 | HR 114 | Ht 62.0 in | Wt 117.0 lb

## 2023-05-02 DIAGNOSIS — R0602 Shortness of breath: Secondary | ICD-10-CM

## 2023-05-02 DIAGNOSIS — I4819 Other persistent atrial fibrillation: Secondary | ICD-10-CM | POA: Diagnosis not present

## 2023-05-02 DIAGNOSIS — I4719 Other supraventricular tachycardia: Secondary | ICD-10-CM | POA: Diagnosis not present

## 2023-05-02 DIAGNOSIS — I7300 Raynaud's syndrome without gangrene: Secondary | ICD-10-CM

## 2023-05-02 DIAGNOSIS — I10 Essential (primary) hypertension: Secondary | ICD-10-CM

## 2023-05-02 MED ORDER — ALBUTEROL SULFATE HFA 108 (90 BASE) MCG/ACT IN AERS: 2.0000 | INHALATION_SPRAY | RESPIRATORY_TRACT | 1 refills | Status: AC | PRN

## 2023-05-02 MED ORDER — DILTIAZEM HCL ER 240 MG PO CP24: 240.0000 mg | ORAL_CAPSULE | Freq: Every day | ORAL | 3 refills | Status: DC

## 2023-05-02 MED ORDER — AMIODARONE HCL 200 MG PO TABS: ORAL_TABLET | ORAL | 0 refills | Status: DC

## 2023-05-02 NOTE — Telephone Encounter (Signed)
 Pt c/o medication issue:  1. Name of Medication:   lisinopril  (ZESTRIL ) 10 MG tablet   2. How are you currently taking this medication (dosage and times per day)?   3. Are you having a reaction (difficulty breathing--STAT)?   4. What is your medication issue?   Daughter Stephenie Einstein) stated patient had 3 medications removed from her list today - Lisinopril  and 2 other medications.  Daughter wants a call back to discuss the other medications.  Daughter stated patient was also supposed to be given an inhaler and they do not have a prescription for that medication.

## 2023-05-02 NOTE — Telephone Encounter (Signed)
 See previous Encounter note (thru MyChart pt message)

## 2023-05-02 NOTE — Patient Instructions (Addendum)
 Medication Instructions:  - START AMIODARONE  200 MG twice daily for 7 days then 200 mg daily.  - Start Diltiazem  240 MG DAILY   *If you need a refill on your cardiac medications before your next appointment, please call your pharmacy*   Lab Work: COMPLETE BLOOD COUNT  BASIC METABOLIC PANEL    If you have labs (blood work) drawn today and your tests are completely normal, you will receive your results only by: MyChart Message (if you have MyChart) OR A paper copy in the mail If you have any lab test that is abnormal or we need to change your treatment, we will call you to review the results.   Testing/Procedures: CENTRAL SCHEDULING CLOSED. CALL ON MONDAY   AVS UPDATED: 05/14/23.       Dear Kathleen Santana  You are scheduled for a Cardioversion on Monday, May 5 with Dr. Maximo Spar.  Please arrive at the [facility] (Main Entrance A) at [facility]: [address] [place], [place] [address] at 9:00 AM (This time is 1 hour(s) before your procedure to ensure your preparation).   Free valet parking service is available. You will check in at ADMITTING.   *Please Note: You will receive a call the day before your procedure to confirm the appointment time. That time may have changed from the original time based on the schedule for that day.*    DIET:  Nothing to eat or drink after midnight except a sip of water with medications (see medication instructions below)  MEDICATION INSTRUCTIONS: !!IF ANY NEW MEDICATIONS ARE STARTED AFTER TODAY, PLEASE NOTIFY YOUR PROVIDER AS SOON AS POSSIBLE!!  FYI: Medications such as Semaglutide (Ozempic, [place]), Tirzepatide (Mounjaro, Zepbound), Dulaglutide (Trulicity), etc ("GLP1 agonists") AND Canagliflozin (Invokana), Dapagliflozin (Farxiga), Empagliflozin (Jardiance), Ertugliflozin (Steglatro), Bexagliflozin [employer]) or any combination with one of these drugs such as Invokamet (Canagliflozin/Metformin), Synjardy (Empagliflozin/Metformin), etc  ("SGLT2 inhibitors") must be held around the time of a procedure. This is not a comprehensive list of all of these drugs. Please review all of your medications and talk to your provider if you take any one of these. If you are not sure, ask your provider.     Continue taking your anticoagulant (blood thinner): Apixaban  (Eliquis ).  You will need to continue this after your procedure until you are told by your provider that it is safe to stop.    LABS:  COMPLETED AT LAST OFFICE VISIT    FYI:  For your safety, and to allow us  to monitor your vital signs accurately during the surgery/procedure we request: If you have artificial nails, gel coating, SNS etc, please have those removed prior to your surgery/procedure. Not having the nail coverings /polish removed may result in cancellation or delay of your surgery/procedure.  Your support person will be asked to wait in the waiting room during your procedure.  It is OK to have someone drop you off and come back when you are ready to be discharged.  You cannot drive after the procedure and will need someone to drive you home.  Bring your insurance cards.  *Special Note: Every effort is made to have your procedure done on time. Occasionally there are emergencies that occur at the hospital that may cause delays. Please be patient if a delay does occur.       Follow-Up: At [facility], you and your health needs are our priority.  As part of our continuing mission to provide you with exceptional heart care, we have created designated Provider  Care Teams.  These Care Teams include your primary Cardiologist (physician) and Advanced Practice Providers (APPs -  Physician Assistants and Nurse Practitioners) who all work together to provide you with the care you need, when you need it.  We recommend signing up for the patient portal called "MyChart".  Sign up information is provided on this After Visit Summary.  MyChart is used to connect with patients for  Virtual Visits (Telemedicine).  Patients are able to view lab/test results, encounter notes, upcoming appointments, etc.  Non-urgent messages can be sent to your provider as well.   To learn more about what you can do with MyChart, go to [email or web address].    Your next appointment:   6 week(s): FRIDAY Jun 13, 2023 AT 9:00AM   The format for your next appointment:   In Person  Provider:   Oneil Bigness, MD    Other Instructions

## 2023-05-02 NOTE — Telephone Encounter (Signed)
 Called and spoke to pt and daughter.  Pt should Stop: Metoprolol , Amlodipine , and Lisinopril  (Medications/med list updated in chart).  Albuterol  inhaler prescribed (q4hrs prn), per Dr. Rolm Clos, RX sent to [facility] in [place].    No other questions/concerns.

## 2023-05-03 LAB — BASIC METABOLIC PANEL WITH GFR
BUN/Creatinine Ratio: 19 (ref 12–28)
BUN: 25 mg/dL (ref 10–36)
CO2: 21 mmol/L (ref 20–29)
Calcium: 9.2 mg/dL (ref 8.7–10.3)
Chloride: 100 mmol/L (ref 96–106)
Creatinine, Ser: 1.32 mg/dL — ABNORMAL HIGH (ref 0.57–1.00)
Glucose: 83 mg/dL (ref 70–99)
Potassium: 5.3 mmol/L — ABNORMAL HIGH (ref 3.5–5.2)
Sodium: 136 mmol/L (ref 134–144)
eGFR: 37 mL/min/{1.73_m2} — ABNORMAL LOW (ref >59)

## 2023-05-03 LAB — CBC
Hematocrit: 37.4 % (ref 34.0–46.6)
Hemoglobin: 12.1 g/dL (ref 11.1–15.9)
MCH: 30.8 pg (ref 26.6–33.0)
MCHC: 32.4 g/dL (ref 31.5–35.7)
MCV: 95 fL (ref 79–97)
Platelets: 264 10*3/uL (ref 150–450)
RBC: 3.93 x10E6/uL (ref 3.77–5.28)
RDW: 13.6 % (ref 11.7–15.4)
WBC: 7.7 10*3/uL (ref 3.4–10.8)

## 2023-05-04 ENCOUNTER — Encounter: Payer: Self-pay | Admitting: Cardiovascular Disease

## 2023-05-06 ENCOUNTER — Emergency Department (HOSPITAL_COMMUNITY)

## 2023-05-06 ENCOUNTER — Inpatient Hospital Stay (HOSPITAL_COMMUNITY)
Admission: EM | Admit: 2023-05-06 | Discharge: 2023-05-11 | DRG: 291 | Disposition: A | Discharge status: Home / Self Care | Attending: Internal Medicine | Admitting: Internal Medicine

## 2023-05-06 ENCOUNTER — Other Ambulatory Visit: Payer: Self-pay

## 2023-05-06 ENCOUNTER — Inpatient Hospital Stay (HOSPITAL_COMMUNITY)

## 2023-05-06 DIAGNOSIS — I509 Heart failure, unspecified: Secondary | ICD-10-CM

## 2023-05-06 DIAGNOSIS — I5031 Acute diastolic (congestive) heart failure: Secondary | ICD-10-CM | POA: Diagnosis not present

## 2023-05-06 DIAGNOSIS — T380X5A Adverse effect of glucocorticoids and synthetic analogues, initial encounter: Secondary | ICD-10-CM | POA: Diagnosis not present

## 2023-05-06 DIAGNOSIS — K219 Gastro-esophageal reflux disease without esophagitis: Secondary | ICD-10-CM | POA: Diagnosis present

## 2023-05-06 DIAGNOSIS — J929 Pleural plaque without asbestos: Secondary | ICD-10-CM | POA: Diagnosis not present

## 2023-05-06 DIAGNOSIS — J42 Unspecified chronic bronchitis: Secondary | ICD-10-CM | POA: Diagnosis not present

## 2023-05-06 DIAGNOSIS — E871 Hypo-osmolality and hyponatremia: Secondary | ICD-10-CM | POA: Diagnosis present

## 2023-05-06 DIAGNOSIS — D72823 Leukemoid reaction: Secondary | ICD-10-CM | POA: Diagnosis not present

## 2023-05-06 DIAGNOSIS — J9601 Acute respiratory failure with hypoxia: Secondary | ICD-10-CM | POA: Diagnosis not present

## 2023-05-06 DIAGNOSIS — E785 Hyperlipidemia, unspecified: Secondary | ICD-10-CM | POA: Diagnosis not present

## 2023-05-06 DIAGNOSIS — I071 Rheumatic tricuspid insufficiency: Secondary | ICD-10-CM | POA: Diagnosis present

## 2023-05-06 DIAGNOSIS — R062 Wheezing: Secondary | ICD-10-CM | POA: Diagnosis not present

## 2023-05-06 DIAGNOSIS — J40 Bronchitis, not specified as acute or chronic: Secondary | ICD-10-CM | POA: Diagnosis present

## 2023-05-06 DIAGNOSIS — J90 Pleural effusion, not elsewhere classified: Secondary | ICD-10-CM | POA: Diagnosis not present

## 2023-05-06 DIAGNOSIS — I10 Essential (primary) hypertension: Secondary | ICD-10-CM | POA: Diagnosis not present

## 2023-05-06 DIAGNOSIS — J9600 Acute respiratory failure, unspecified whether with hypoxia or hypercapnia: Secondary | ICD-10-CM | POA: Diagnosis present

## 2023-05-06 DIAGNOSIS — I4819 Other persistent atrial fibrillation: Secondary | ICD-10-CM | POA: Diagnosis present

## 2023-05-06 DIAGNOSIS — E872 Acidosis, unspecified: Secondary | ICD-10-CM | POA: Diagnosis present

## 2023-05-06 DIAGNOSIS — I4891 Unspecified atrial fibrillation: Secondary | ICD-10-CM | POA: Diagnosis not present

## 2023-05-06 DIAGNOSIS — N1832 Chronic kidney disease, stage 3b: Secondary | ICD-10-CM | POA: Diagnosis present

## 2023-05-06 DIAGNOSIS — E039 Hypothyroidism, unspecified: Secondary | ICD-10-CM | POA: Diagnosis not present

## 2023-05-06 DIAGNOSIS — I361 Nonrheumatic tricuspid (valve) insufficiency: Secondary | ICD-10-CM

## 2023-05-06 DIAGNOSIS — Z7901 Long term (current) use of anticoagulants: Secondary | ICD-10-CM | POA: Diagnosis not present

## 2023-05-06 DIAGNOSIS — R0600 Dyspnea, unspecified: Principal | ICD-10-CM

## 2023-05-06 DIAGNOSIS — I130 Hypertensive heart and chronic kidney disease with heart failure and stage 1 through stage 4 chronic kidney disease, or unspecified chronic kidney disease: Secondary | ICD-10-CM | POA: Diagnosis not present

## 2023-05-06 DIAGNOSIS — Z79899 Other long term (current) drug therapy: Secondary | ICD-10-CM

## 2023-05-06 DIAGNOSIS — R918 Other nonspecific abnormal finding of lung field: Secondary | ICD-10-CM | POA: Diagnosis not present

## 2023-05-06 DIAGNOSIS — R0602 Shortness of breath: Secondary | ICD-10-CM | POA: Diagnosis not present

## 2023-05-06 DIAGNOSIS — Z79890 Hormone replacement therapy (postmenopausal): Secondary | ICD-10-CM

## 2023-05-06 DIAGNOSIS — J9811 Atelectasis: Secondary | ICD-10-CM | POA: Diagnosis not present

## 2023-05-06 DIAGNOSIS — Z87891 Personal history of nicotine dependence: Secondary | ICD-10-CM | POA: Diagnosis not present

## 2023-05-06 DIAGNOSIS — J189 Pneumonia, unspecified organism: Secondary | ICD-10-CM | POA: Diagnosis not present

## 2023-05-06 DIAGNOSIS — J180 Bronchopneumonia, unspecified organism: Secondary | ICD-10-CM | POA: Diagnosis not present

## 2023-05-06 DIAGNOSIS — R0689 Other abnormalities of breathing: Secondary | ICD-10-CM | POA: Diagnosis not present

## 2023-05-06 DIAGNOSIS — I480 Paroxysmal atrial fibrillation: Secondary | ICD-10-CM | POA: Diagnosis not present

## 2023-05-06 DIAGNOSIS — I700 Atherosclerosis of aorta: Secondary | ICD-10-CM | POA: Diagnosis not present

## 2023-05-06 DIAGNOSIS — I5033 Acute on chronic diastolic (congestive) heart failure: Secondary | ICD-10-CM | POA: Diagnosis present

## 2023-05-06 DIAGNOSIS — Z452 Encounter for adjustment and management of vascular access device: Secondary | ICD-10-CM | POA: Diagnosis not present

## 2023-05-06 DIAGNOSIS — J208 Acute bronchitis due to other specified organisms: Secondary | ICD-10-CM | POA: Diagnosis not present

## 2023-05-06 DIAGNOSIS — M810 Age-related osteoporosis without current pathological fracture: Secondary | ICD-10-CM | POA: Diagnosis present

## 2023-05-06 DIAGNOSIS — J80 Acute respiratory distress syndrome: Secondary | ICD-10-CM | POA: Diagnosis not present

## 2023-05-06 DIAGNOSIS — N183 Chronic kidney disease, stage 3 unspecified: Secondary | ICD-10-CM | POA: Diagnosis present

## 2023-05-06 DIAGNOSIS — D72829 Elevated white blood cell count, unspecified: Secondary | ICD-10-CM | POA: Diagnosis not present

## 2023-05-06 DIAGNOSIS — R0902 Hypoxemia: Secondary | ICD-10-CM | POA: Diagnosis not present

## 2023-05-06 DIAGNOSIS — E8721 Acute metabolic acidosis: Secondary | ICD-10-CM | POA: Diagnosis present

## 2023-05-06 LAB — CBC WITH DIFFERENTIAL/PLATELET
Abs Immature Granulocytes: 0.02 10*3/uL (ref 0.00–0.07)
Basophils Absolute: 0.1 10*3/uL (ref 0.0–0.1)
Basophils Relative: 1 %
Eosinophils Absolute: 0 10*3/uL (ref 0.0–0.5)
Eosinophils Relative: 0 %
HCT: 36.9 % (ref 36.0–46.0)
Hemoglobin: 12 g/dL (ref 12.0–15.0)
Immature Granulocytes: 0 %
Lymphocytes Relative: 23 %
Lymphs Abs: 2.2 10*3/uL (ref 0.7–4.0)
MCH: 30.8 pg (ref 26.0–34.0)
MCHC: 32.5 g/dL (ref 30.0–36.0)
MCV: 94.9 fL (ref 80.0–100.0)
Monocytes Absolute: 1 10*3/uL (ref 0.1–1.0)
Monocytes Relative: 10 %
Neutro Abs: 6.4 10*3/uL (ref 1.7–7.7)
Neutrophils Relative %: 66 %
Platelets: 268 10*3/uL (ref 150–400)
RBC: 3.89 MIL/uL (ref 3.87–5.11)
RDW: 15.4 % (ref 11.5–15.5)
WBC: 9.8 10*3/uL (ref 4.0–10.5)
nRBC: 0 % (ref 0.0–0.2)

## 2023-05-06 LAB — I-STAT CHEM 8, ED
BUN: 27 mg/dL — ABNORMAL HIGH (ref 8–23)
Calcium, Ion: 1.11 mmol/L — ABNORMAL LOW (ref 1.15–1.40)
Chloride: 101 mmol/L (ref 98–111)
Creatinine, Ser: 1.5 mg/dL — ABNORMAL HIGH (ref 0.44–1.00)
Glucose, Bld: 148 mg/dL — ABNORMAL HIGH (ref 70–99)
HCT: 38 % (ref 36.0–46.0)
Hemoglobin: 12.9 g/dL (ref 12.0–15.0)
Potassium: 4.5 mmol/L (ref 3.5–5.1)
Sodium: 134 mmol/L — ABNORMAL LOW (ref 135–145)
TCO2: 22 mmol/L (ref 22–32)

## 2023-05-06 LAB — I-STAT VENOUS BLOOD GAS, ED
Acid-base deficit: 3 mmol/L — ABNORMAL HIGH (ref 0.0–2.0)
Bicarbonate: 21.8 mmol/L (ref 20.0–28.0)
Calcium, Ion: 1.12 mmol/L — ABNORMAL LOW (ref 1.15–1.40)
HCT: 38 % (ref 36.0–46.0)
Hemoglobin: 12.9 g/dL (ref 12.0–15.0)
O2 Saturation: 71 %
Potassium: 4.5 mmol/L (ref 3.5–5.1)
Sodium: 134 mmol/L — ABNORMAL LOW (ref 135–145)
TCO2: 23 mmol/L (ref 22–32)
pCO2, Ven: 37 mmHg — ABNORMAL LOW (ref 44–60)
pH, Ven: 7.379 (ref 7.25–7.43)
pO2, Ven: 38 mmHg (ref 32–45)

## 2023-05-06 LAB — COMPREHENSIVE METABOLIC PANEL WITH GFR
ALT: 30 U/L (ref 0–44)
AST: 37 U/L (ref 15–41)
Albumin: 3.8 g/dL (ref 3.5–5.0)
Alkaline Phosphatase: 64 U/L (ref 38–126)
Anion gap: 13 (ref 5–15)
BUN: 24 mg/dL — ABNORMAL HIGH (ref 8–23)
CO2: 21 mmol/L — ABNORMAL LOW (ref 22–32)
Calcium: 9 mg/dL (ref 8.9–10.3)
Chloride: 100 mmol/L (ref 98–111)
Creatinine, Ser: 1.4 mg/dL — ABNORMAL HIGH (ref 0.44–1.00)
GFR, Estimated: 35 mL/min — ABNORMAL LOW (ref >60)
Glucose, Bld: 147 mg/dL — ABNORMAL HIGH (ref 70–99)
Potassium: 4.5 mmol/L (ref 3.5–5.1)
Sodium: 134 mmol/L — ABNORMAL LOW (ref 135–145)
Total Bilirubin: 0.5 mg/dL (ref 0.0–1.2)
Total Protein: 6.5 g/dL (ref 6.5–8.1)

## 2023-05-06 LAB — RESP PANEL BY RT-PCR (RSV, FLU A&B, COVID)  RVPGX2
Influenza A by PCR: NEGATIVE
Influenza B by PCR: NEGATIVE
Resp Syncytial Virus by PCR: NEGATIVE
SARS Coronavirus 2 by RT PCR: NEGATIVE

## 2023-05-06 LAB — TROPONIN I (HIGH SENSITIVITY)
Troponin I (High Sensitivity): 8 ng/L (ref <18)
Troponin I (High Sensitivity): 6 ng/L (ref <18)

## 2023-05-06 LAB — I-STAT CG4 LACTIC ACID, ED
Lactic Acid, Venous: 4.4 mmol/L (ref 0.5–1.9)
Lactic Acid, Venous: 4.2 mmol/L (ref 0.5–1.9)

## 2023-05-06 LAB — PROCALCITONIN: Procalcitonin: <0.1 ng/mL

## 2023-05-06 LAB — BRAIN NATRIURETIC PEPTIDE: B Natriuretic Peptide: 227.8 pg/mL — ABNORMAL HIGH (ref 0.0–100.0)

## 2023-05-06 MED ORDER — DILTIAZEM HCL ER COATED BEADS 240 MG PO CP24
240.0000 mg | ORAL_CAPSULE | Freq: Every day | ORAL | Status: DC
  Administered 2023-05-07 – 2023-05-11 (×5): 240 mg via ORAL
  Filled 2023-05-06 (×5): qty 1
  Filled 2023-05-06: qty 2

## 2023-05-06 MED ORDER — GUAIFENESIN ER 600 MG PO TB12
600.0000 mg | ORAL_TABLET | Freq: Two times a day (BID) | ORAL | Status: DC
  Administered 2023-05-06 – 2023-05-11 (×10): 600 mg via ORAL
  Filled 2023-05-06 (×10): qty 1

## 2023-05-06 MED ORDER — SODIUM CHLORIDE 0.9 % IV SOLN
2.0000 g | Freq: Once | INTRAVENOUS | Status: AC
  Administered 2023-05-06: 2 g via INTRAVENOUS
  Filled 2023-05-06: qty 20

## 2023-05-06 MED ORDER — ALBUTEROL SULFATE (2.5 MG/3ML) 0.083% IN NEBU
2.5000 mg | INHALATION_SOLUTION | Freq: Two times a day (BID) | RESPIRATORY_TRACT | Status: DC
  Administered 2023-05-06 – 2023-05-09 (×5): 2.5 mg via RESPIRATORY_TRACT
  Filled 2023-05-06 (×6): qty 3

## 2023-05-06 MED ORDER — SODIUM CHLORIDE 0.9% FLUSH
3.0000 mL | Freq: Two times a day (BID) | INTRAVENOUS | Status: DC
  Administered 2023-05-06 – 2023-05-11 (×7): 3 mL via INTRAVENOUS

## 2023-05-06 MED ORDER — LEVOTHYROXINE SODIUM 50 MCG PO TABS
50.0000 ug | ORAL_TABLET | Freq: Every day | ORAL | Status: DC
  Administered 2023-05-07 – 2023-05-11 (×5): 50 ug via ORAL
  Filled 2023-05-06 (×2): qty 1
  Filled 2023-05-06 (×2): qty 2
  Filled 2023-05-06 (×2): qty 1

## 2023-05-06 MED ORDER — ALBUTEROL SULFATE (2.5 MG/3ML) 0.083% IN NEBU: 2.5000 mg | INHALATION_SOLUTION | RESPIRATORY_TRACT | Status: DC | PRN

## 2023-05-06 MED ORDER — AMIODARONE HCL 200 MG PO TABS
200.0000 mg | ORAL_TABLET | Freq: Two times a day (BID) | ORAL | Status: DC
  Administered 2023-05-06 – 2023-05-11 (×10): 200 mg via ORAL
  Filled 2023-05-06 (×10): qty 1

## 2023-05-06 MED ORDER — ALBUTEROL SULFATE (2.5 MG/3ML) 0.083% IN NEBU: 2.5000 mg | INHALATION_SOLUTION | Freq: Four times a day (QID) | RESPIRATORY_TRACT | Status: DC | PRN

## 2023-05-06 MED ORDER — GABAPENTIN 300 MG PO CAPS
300.0000 mg | ORAL_CAPSULE | Freq: Two times a day (BID) | ORAL | Status: DC
Start: 2023-05-06 — End: 2023-05-11
  Administered 2023-05-06 – 2023-05-11 (×10): 300 mg via ORAL
  Filled 2023-05-06 (×8): qty 1
  Filled 2023-05-06: qty 3

## 2023-05-06 MED ORDER — POLYVINYL ALCOHOL 1.4 % OP SOLN: 1.0000 [drp] | Freq: Three times a day (TID) | OPHTHALMIC | Status: DC | PRN

## 2023-05-06 MED ORDER — MAGNESIUM GLUCONATE 500 (27 MG) MG PO TABS
500.0000 mg | ORAL_TABLET | Freq: Every day | ORAL | Status: DC
  Administered 2023-05-07 – 2023-05-11 (×5): 500 mg via ORAL
  Filled 2023-05-06 (×5): qty 1

## 2023-05-06 MED ORDER — ACETAMINOPHEN 650 MG RE SUPP: 650.0000 mg | Freq: Four times a day (QID) | RECTAL | Status: DC | PRN

## 2023-05-06 MED ORDER — ACETAMINOPHEN 325 MG PO TABS: 650.0000 mg | ORAL_TABLET | Freq: Four times a day (QID) | ORAL | Status: DC | PRN

## 2023-05-06 MED ORDER — PREDNISONE 20 MG PO TABS
40.0000 mg | ORAL_TABLET | Freq: Every day | ORAL | Status: DC
  Administered 2023-05-07: 40 mg via ORAL
  Filled 2023-05-06 (×2): qty 2

## 2023-05-06 MED ORDER — APIXABAN 2.5 MG PO TABS
2.5000 mg | ORAL_TABLET | Freq: Two times a day (BID) | ORAL | Status: DC
  Administered 2023-05-06 – 2023-05-11 (×10): 2.5 mg via ORAL
  Filled 2023-05-06 (×10): qty 1

## 2023-05-06 MED ORDER — AMIODARONE HCL 200 MG PO TABS: 200.0000 mg | ORAL_TABLET | Freq: Every day | ORAL | Status: DC

## 2023-05-06 MED ORDER — FUROSEMIDE 10 MG/ML IJ SOLN
40.0000 mg | Freq: Once | INTRAMUSCULAR | Status: AC
  Administered 2023-05-06: 40 mg via INTRAVENOUS
  Filled 2023-05-06: qty 4

## 2023-05-06 NOTE — ED Triage Notes (Signed)
 Patient arrives [facility] from home for shortness of breath. Daughter called, patient found supine on the couch accessory muscle use, feeling bad last night. Albuterol  initially 90 on room air, rhonchi, right lower decreased. Unproductive cough, 2 duoneb, CPAP placed due to sats in low 80s-decreased sounds. Wheezing subsided. 92 on cpap, afib on monitor hx of same. HR 80-96. 125 solumedrol  138/92  20 LAC.

## 2023-05-06 NOTE — H&P (View-Only) (Signed)
 History and Physical    Patient: Kathleen Santana WUJ:[id number] DOB: [birth year] DOA: 05/06/2023 DOS: the patient was seen and examined on 05/06/2023 PCP: Vladimir Groves, PA-C  Patient coming from: Home  Chief Complaint:  Chief Complaint  Patient presents with   Shortness of Breath   HPI: Kathleen Santana is a 89 y.o. female with medical history significant of hypertension, persistent atrial fibrillation on chronic anticoagulation, hypothyroidism, osteoporosis, and GERD who presents with shortness of breath, leg swelling, and abdominal distension.  She has a history of atrial fibrillation and was previously on metoprolol , which was discontinued. She was started on amiodarone  and is currently on Eliquis . She underwent cardioversion last month but reverted back to atrial fibrillation shortly after.  She describes a recent onset of shortness of breath that began yesterday, accompanied by swelling in her feet and a hard, distended abdomen. She notes discomfort under her rib cage but no pain. She also reports feeling cold, but no fever or chills.  She has also had a nonproductive cough.  Her wheezing, which was present previously, has significantly reduced. She mentions a 'rattle' in the middle of her chest.  She has undergone two rounds of antibiotics and two chest x-rays due to concerns of pneumonia, but the x-rays still show possible pneumonia. She has been experiencing lung issues for several weeks.  With EMS patient was noted to have initial O2 saturations in the 90s.  Patient had been given 2 DuoNeb breathing treatments and placed on CPAP due to O2 saturations dropping into the 80s.  Prior to arrival patient had been given 125 mg of Solu-Medrol IV.  In the emergency department patient was noted to be afebrile with O2 saturations maintained on BiPAP temporarily.  Labs significant for WBC 9.8, sodium 134, BUN 24, creatinine 1.4, glucose 147, BNP 227.8, high-sensitivity troponin 8, and  lactic acid 4.4->4.2.  Chest x-ray noted bilateral basilar pulmonary infiltrates and atelectasis with small bilateral pleural effusions.  TRH consulted to admit.  Review of Systems: As mentioned in the history of present illness. All other systems reviewed and are negative. Past Medical History:  Diagnosis Date   Afib (HCC)    Arrhythmia    COVID-19    2022   GERD (gastroesophageal reflux disease)    Hypertension    Hypothyroidism    Lower extremity neuropathy    Osteoporosis    Pneumonia    2021   Rhinitis, non-allergic    Past Surgical History:  Procedure Laterality Date   BACK SURGERY     CARDIOVERSION N/A 04/08/2023   Procedure: CARDIOVERSION;  Surgeon: Hugh Madura, MD;  Location: [facility] INVASIVE CV LAB;  Service: Cardiovascular;  Laterality: N/A;   CATARACT EXTRACTION Bilateral    2021   NOSE SURGERY     OPEN REDUCTION INTERNAL FIXATION (ORIF) METACARPAL Left 01/13/2022   Procedure: OPEN REDUCTION INTERNAL FIXATION (ORIF) METACARPAL, LEFT INDEX AND LONG FINGER;  Surgeon: Arvil Birks, MD;  Location: [facility] OR;  Service: Orthopedics;  Laterality: Left;  REGIONAL WITH IV SEDATION   Social History:  reports that she has quit smoking. Her smoking use included cigarettes. She has never used smokeless tobacco. She reports that she does not drink alcohol  and does not use drugs.  Allergies  Allergen Reactions   Nickel Rash   Norco [Hydrocodone-Acetaminophen ] Nausea And Vomiting and Other (See Comments)    Passed out, dizziness   Other Other (See Comments) and Cough    Seasonal allergies- congestion and itchy eyes, also  Family History  Problem Relation Age of Onset   Arrhythmia Mother    Heart disease Father     Prior to Admission medications   Medication Sig Start Date End Date Taking? Authorizing Provider  albuterol  (VENTOLIN  HFA) 108 (90 Base) MCG/ACT inhaler Inhale 2 puffs into the lungs every 4 (four) hours as needed for wheezing or shortness of breath. 05/02/23 07/31/23  Yes O'Neal, Cathay Clonts, MD  amiodarone  (PACERONE ) 200 MG tablet Take 200 MG by mouth twice daily for 7 days. Then 200 MG by mouth daily. 05/02/23  Yes O'Neal, Cathay Clonts, MD  apixaban  (ELIQUIS ) 2.5 MG TABS tablet Take 1 tablet (2.5 mg total) by mouth 2 (two) times daily. 12/10/22  Yes Monge, Nelva Bang, NP  cholecalciferol (VITAMIN D3) 25 MCG (1000 UNIT) tablet Take 1,000 Units by mouth daily.   Yes [provider]  cyanocobalamin (VITAMIN B12) 1000 MCG tablet Take 1,000 mcg by mouth daily.   Yes [provider]  diltiazem  (DILACOR XR ) 240 MG 24 hr capsule Take 1 capsule (240 mg total) by mouth daily. 05/02/23  Yes O'Neal, Cathay Clonts, MD  gabapentin  (NEURONTIN ) 300 MG capsule Take 300 mg by mouth 2 (two) times daily.   Yes [provider]  ipratropium (ATROVENT ) 0.06 % nasal spray Place 1 spray into both nostrils 3 (three) times daily as needed for rhinitis.   Yes [provider]  levothyroxine  (SYNTHROID ) 50 MCG tablet Take 50 mcg by mouth daily before breakfast.   Yes [provider]  magnesium  gluconate (MAGONATE) 500 (27 Mg) MG TABS tablet Take 500 mg by mouth daily.   Yes [provider]  Multiple Vitamin (MULTIVITAMIN WITH MINERALS) TABS tablet Take 1 tablet by mouth daily with breakfast. One A Day for Women   Yes [provider]  Propylene Glycol, PF, (SYSTANE COMPLETE PF) 0.6 % SOLN Place 1 drop into both eyes 3 (three) times daily as needed (dry/irritated eyes.).   Yes [provider]    Physical Exam: Vitals:   05/06/23 1149 05/06/23 1152 05/06/23 1156 05/06/23 1200  BP:  120/86    Pulse:   83 84  Resp:  15  (!) 32  Temp:   (!) 97.5 F (36.4 C)   TempSrc:   Oral   SpO2:   97% 97%  Weight: 53.1 kg     Height: 5\' 2"  (1.575 m)      Constitutional: Elderly female currently in NAD, calm, comfortable Eyes: PERRL, lids and conjunctivae normal ENMT: Mucous membranes are moist.  Normal dentition.  Neck: normal,  supple. JVD present Respiratory: Patient noted to have rhonchi with some mild expiratory wheezes appreciated.  Patient currently off of BiPAP at this time. Cardiovascular: Irregular irregular.  At least +1 pitting lower extremity edema. Abdomen: no tenderness, no masses palpated.  Bowel sounds positive.  Musculoskeletal: no clubbing / cyanosis. No joint deformity upper and lower extremities. Good ROM, no contractures. Normal muscle tone.  Skin: no rashes, lesions, ulcers. No induration Neurologic: CN 2-12 grossly intact.  Strength 5/5 in all 4.  Psychiatric: Normal judgment and insight. Alert and oriented x 3. Normal mood.   Data Reviewed:  EKG revealed atrial fibrillation at 88 bpm.  Reviewed labs, imaging, and pertinent records as documented.  Assessment and Plan:  Acute respiratory failure with hypoxia secondary to diastolic congestive heart failure Severe tricuspid valve regurgitation Possible community-acquired pneumonia Patient presents with acute worsening of shortness of breath yesterday evening with reports of  cough and increased lower  extremity swelling into her abdomen.  Reported to be treated for 2 rounds of antibiotics.  En route with EMS patient's O2 saturations noted to be in the low 80s with decreased breath sounds for which she was placed on CPAP.  In the emergency department she was placed temporarily on BiPAP due to her work of breathing. Patient had at least 1+ pitting edema in the bilateral lower extremities and JVD on physical exam.  Labs significant for BNP 227.8.  Echocardiogram at this recent and then performed on 4/15 which noted EF to be preserved at 50 to 55% with indeterminate diastolic parameters, moderate to severe tricuspid valve regurgitation,moderate mitral valve regurgitation, and severely dilated right atrium. - Admit to a progressive bed - Continuous pulse oximetry with nasal cannula oxygen maintain O2 saturation greater than 90% - Strict I&O's and daily  weights - Check procalcitonin - Checking CT scan of the chest without contrast - Rocephin  IV - Lasix  40 mg IV given x 1 dose.  Reassess a determine need of further IV diuresis - Appreciate cardiology consultative services we will follow-up for any further recommendations   Lactic acidosis Acute.  Lactic acid elevated at 4.4 on admission.  Blood cultures have been obtained.  Suspect secondary possibly to hypoperfusion in the setting of patient being fluid overloaded versus possibility of infection. - Follow-up blood cultures - Empiric antibiotics of Rocephin  given  Bronchitis Physical exam patient noted to have some mild expiratory wheezes present and rhonchi.  Patient had been given Solu-Medrol 125 mg IV x 1 dose prior to arrival. - Incentive spirometry and flutter valve - Prednisone  - Breathing treatments  - Mucinex   Paroxysmal atrial fibrillation on chronic anticoagulation Patient currently in atrial fibrillation, but appears rate controlled.  Metoprolol  just recently been stopped and she was started on amiodarone  and diltiazem . - Goal potassium at least 4 and magnesium  at least 2 - Continue Eliquis , amiodarone , and diltiazem   Essential hypertension Blood pressures currently maintained.  Medications of lisinopril  and amlodipine  were discontinued during last office visit with Dr. Lucy Sack on 4/18 in the setting of starting patient on amiodarone  and diltiazem . - Continue current medication regimen  Hypothyroidism TSH noted to be 2.2 when checked on 04/02/2023. - Continue levothyroxine   Chronic kidney disease stage IIIb Creatinine noted to be 1.4 with BUN 24 which appears slightly higher than baseline of 1.1-1.3. -Continue to monitor kidney function with diuresis   DVT prophylaxis: Eliquis  Advance Care Planning:   Code Status: Full Code   Consults: Cardiology  Family Communication: Family updated at bedside.  Severity of Illness: The appropriate patient status for this  patient is INPATIENT. Inpatient status is judged to be reasonable and necessary in order to provide the required intensity of service to ensure the patient's safety. The patient's presenting symptoms, physical exam findings, and initial radiographic and laboratory data in the context of their chronic comorbidities is felt to place them at high risk for further clinical deterioration. Furthermore, it is not anticipated that the patient will be medically stable for discharge from the hospital within 2 midnights of admission.   * I certify that at the point of admission it is my clinical judgment that the patient will require inpatient hospital care spanning beyond 2 midnights from the point of admission due to high intensity of service, high risk for further deterioration and high frequency of surveillance required.*  Author: Lena Qualia, MD 05/06/2023 3:47 PM  For on call review [email or web address].

## 2023-05-06 NOTE — ED Provider Notes (Signed)
 [place] EMERGENCY DEPARTMENT AT [facility] Provider Note   CSN: [id number] Arrival date & time: 05/06/23  1145     History  Chief Complaint  Patient presents with   Shortness of Breath    Kathleen Santana is a 89 y.o. female.  89 year old female with prior medical history as detailed below presents for evaluation.  Patient arrives from home with EMS transport.  Patient was noted to be hypoxic to 90% on room air.  EMS reports rhonchi bilaterally.  Patient with chronic unproductive cough per EMS.  Patient was given 2 DuoNeb treatments during transport.  Patient was given 125 mg of Solu-Medrol during transport.  Patient was placed on CPAP and then transition to BiPAP on arrival to the ED.  Patient with longstanding history of paroxysmal A-fib -although after recent cardioversion attempt she is now in chronic A-fib.  Patient is comfortable on initial evaluation.  She reports that the BiPAP is helping her symptoms.  She denies chest pain.  She is answering questions appropriately.  Patient's daughter is at bedside.  The history is provided by the patient.       Home Medications Prior to Admission medications   Medication Sig Start Date End Date Taking? Authorizing Provider  albuterol  (VENTOLIN  HFA) 108 (90 Base) MCG/ACT inhaler Inhale 2 puffs into the lungs every 4 (four) hours as needed for wheezing or shortness of breath. 05/02/23 07/31/23  O'NealCathay Clonts, MD  amiodarone  (PACERONE ) 200 MG tablet Take 200 MG by mouth twice daily for 7 days. Then 200 MG by mouth daily. 05/02/23   O'NealCathay Clonts, MD  apixaban  (ELIQUIS ) 2.5 MG TABS tablet Take 1 tablet (2.5 mg total) by mouth 2 (two) times daily. 12/10/22   Jude Norton, NP  cholecalciferol (VITAMIN D3) 25 MCG (1000 UNIT) tablet Take 1,000 Units by mouth daily.    [provider]  cyanocobalamin (VITAMIN B12) 1000 MCG tablet Take 1,000 mcg by mouth daily.    [provider]  diltiazem   (DILACOR XR ) 240 MG 24 hr capsule Take 1 capsule (240 mg total) by mouth daily. 05/02/23   O'Neal, Cathay Clonts, MD  doxycycline (VIBRA-TABS) 100 MG tablet Take 100 mg by mouth 2 (two) times daily.    [provider]  gabapentin  (NEURONTIN ) 300 MG capsule Take 300 mg by mouth 2 (two) times daily.    [provider]  ipratropium (ATROVENT ) 0.06 % nasal spray Place 1 spray into both nostrils 3 (three) times daily as needed for rhinitis.    [provider]  levothyroxine  (SYNTHROID ) 50 MCG tablet Take 50 mcg by mouth daily before breakfast.    [provider]  magnesium  gluconate (MAGONATE) 500 (27 Mg) MG TABS tablet Take 500 mg by mouth daily.    [provider]  Multiple Vitamin (MULTIVITAMIN WITH MINERALS) TABS tablet Take 1 tablet by mouth daily with breakfast. One A Day for Women    [provider]  Multiple Vitamins-Minerals (PRESERVISION AREDS) CAPS Take 1 capsule by mouth in the morning and at bedtime.    [provider]  Propylene Glycol, PF, (SYSTANE COMPLETE PF) 0.6 % SOLN Place 1 drop into both eyes 3 (three) times daily as needed (dry/irritated eyes.).    [provider]      Allergies    Norco [hydrocodone-acetaminophen ] and Other    Review of Systems   Review of Systems  All other systems reviewed and are negative.   Physical Exam Updated Vital Signs Ht  5\' 2"  (1.575 m)   Wt 53.1 kg   BMI 21.40 kg/m  Physical Exam Vitals and nursing note reviewed.  Constitutional:      General: She is not in acute distress.    Appearance: Normal appearance. She is well-developed.  HENT:     Head: Normocephalic and atraumatic.  Eyes:     Conjunctiva/sclera: Conjunctivae normal.     Pupils: Pupils are equal, round, and reactive to light.  Cardiovascular:     Rate and Rhythm: Normal rate and regular rhythm.     Heart sounds: Normal heart sounds.  Pulmonary:     Effort: No respiratory distress.     Breath sounds:  Examination of the right-lower field reveals decreased breath sounds. Examination of the left-lower field reveals decreased breath sounds. Decreased breath sounds present.     Comments: Appears comfortable on BiPAP Abdominal:     General: There is no distension.     Palpations: Abdomen is soft.     Tenderness: There is no abdominal tenderness.  Musculoskeletal:        General: No deformity. Normal range of motion.     Cervical back: Normal range of motion and neck supple.  Skin:    General: Skin is warm and dry.  Neurological:     General: No focal deficit present.     Mental Status: She is alert and oriented to person, place, and time.     ED Results / Procedures / Treatments   Labs (all labs ordered are listed, but only abnormal results are displayed) Labs Reviewed  CULTURE, BLOOD (ROUTINE X 2)  CULTURE, BLOOD (ROUTINE X 2)  COMPREHENSIVE METABOLIC PANEL WITH GFR  CBC WITH DIFFERENTIAL/PLATELET  BRAIN NATRIURETIC PEPTIDE  I-STAT VENOUS BLOOD GAS, ED  I-STAT CHEM 8, ED  I-STAT CG4 LACTIC ACID, ED  TROPONIN I (HIGH SENSITIVITY)    EKG None  Radiology No results found.  Procedures Procedures    Medications Ordered in ED Medications - No data to display  ED Course/ Medical Decision Making/ A&P                                 Medical Decision Making Amount and/or Complexity of Data Reviewed Labs: ordered. Radiology: ordered.  Risk Decision regarding hospitalization.    Medical Screen Complete  This patient presented to the ED with complaint of SOB.  This complaint involves an extensive number of treatment options. The initial differential diagnosis includes, but is not limited to, CHF, COPD, metabolic abnormality, infection, etc.   This presentation is: Acute, Chronic, Self-Limited, Previously Undiagnosed, Uncertain Prognosis, Complicated, Systemic Symptoms, and Threat to Life/Bodily Function  Patient with dyspnea, hypoxia.   Improved with duoneb x 2,  solumedrol, bipap.  Suspected CHF exacerbation vs COPD.   Cardiology aware of case and will consult.  Hospitalist service aware of case and will evaluate for admit.   Co morbidities that complicated the patient's evaluation  See HPI   Additional history obtained:  External records from outside sources obtained and reviewed including prior ED visits and prior Inpatient records.   Problem List / ED Course:  Dyspnea    Disposition:  After consideration of the diagnostic results and the patients response to treatment, I feel that the patent would benefit from admission.          Final Clinical Impression(s) / ED Diagnoses Final diagnoses:  Dyspnea, unspecified type    Rx / DC  Orders ED Discharge Orders     None         Burnette Carte, MD 05/06/23 [phone]

## 2023-05-06 NOTE — H&P (Addendum)
 History and Physical    Patient: Kathleen Santana:[id number] DOB: [birth year] DOA: 05/06/2023 DOS: the patient was seen and examined on 05/06/2023 PCP: Vladimir Groves, PA-C  Patient coming from: Home  Chief Complaint:  Chief Complaint  Patient presents with   Shortness of Breath   HPI: Kathleen Santana is a 89 y.o. female with medical history significant of hypertension, persistent atrial fibrillation on chronic anticoagulation, hypothyroidism, osteoporosis, and GERD who presents with shortness of breath, leg swelling, and abdominal distension.  She has a history of atrial fibrillation and was previously on metoprolol , which was discontinued. She was started on amiodarone  and is currently on Eliquis . She underwent cardioversion last month but reverted back to atrial fibrillation shortly after.  She describes a recent onset of shortness of breath that began yesterday, accompanied by swelling in her feet and a hard, distended abdomen. She notes discomfort under her rib cage but no pain. She also reports feeling cold, but no fever or chills.  She has also had a nonproductive cough.  Her wheezing, which was present previously, has significantly reduced. She mentions a 'rattle' in the middle of her chest.  She has undergone two rounds of antibiotics and two chest x-rays due to concerns of pneumonia, but the x-rays still show possible pneumonia. She has been experiencing lung issues for several weeks.  With EMS patient was noted to have initial O2 saturations in the 90s.  Patient had been given 2 DuoNeb breathing treatments and placed on CPAP due to O2 saturations dropping into the 80s.  Prior to arrival patient had been given 125 mg of Solu-Medrol IV.  In the emergency department patient was noted to be afebrile with O2 saturations maintained on BiPAP temporarily.  Labs significant for WBC 9.8, sodium 134, BUN 24, creatinine 1.4, glucose 147, BNP 227.8, high-sensitivity troponin 8, and  lactic acid 4.4->4.2.  Chest x-ray noted bilateral basilar pulmonary infiltrates and atelectasis with small bilateral pleural effusions.  TRH consulted to admit.  Review of Systems: As mentioned in the history of present illness. All other systems reviewed and are negative. Past Medical History:  Diagnosis Date   Afib (HCC)    Arrhythmia    COVID-19    2022   GERD (gastroesophageal reflux disease)    Hypertension    Hypothyroidism    Lower extremity neuropathy    Osteoporosis    Pneumonia    2021   Rhinitis, non-allergic    Past Surgical History:  Procedure Laterality Date   BACK SURGERY     CARDIOVERSION N/A 04/08/2023   Procedure: CARDIOVERSION;  Surgeon: Hugh Madura, MD;  [facility]: [facility];  Service: Cardiovascular;  Laterality: N/A;   CATARACT EXTRACTION Bilateral    2021   NOSE SURGERY     OPEN REDUCTION INTERNAL FIXATION (ORIF) METACARPAL Left 01/13/2022   Procedure: OPEN REDUCTION INTERNAL FIXATION (ORIF) METACARPAL, LEFT INDEX AND LONG FINGER;  Surgeon: Arvil Birks, MD;  Location: [facility] OR;  Service: Orthopedics;  Laterality: Left;  REGIONAL WITH IV SEDATION   Social History:  reports that she has quit smoking. Her smoking use included cigarettes. She has never used smokeless tobacco. She reports that she does not drink alcohol  and does not use drugs.  Allergies  Allergen Reactions   Nickel Rash   Norco [Hydrocodone-Acetaminophen ] Nausea And Vomiting and Other (See Comments)    Passed out, dizziness   Other Other (See Comments) and Cough    Seasonal allergies- congestion and itchy eyes, also  Family History  Problem Relation Age of Onset   Arrhythmia Mother    Heart disease Father     Prior to Admission medications   Medication Sig Start Date End Date Taking? Authorizing Provider  albuterol  (VENTOLIN  HFA) 108 (90 Base) MCG/ACT inhaler Inhale 2 puffs into the lungs every 4 (four) hours as needed for wheezing or shortness of breath. 05/02/23 07/31/23  Yes O'Neal, Cathay Clonts, MD  amiodarone  (PACERONE ) 200 MG tablet Take 200 MG by mouth twice daily for 7 days. Then 200 MG by mouth daily. 05/02/23  Yes O'Neal, Cathay Clonts, MD  apixaban  (ELIQUIS ) 2.5 MG TABS tablet Take 1 tablet (2.5 mg total) by mouth 2 (two) times daily. 12/10/22  Yes Monge, Nelva Bang, NP  cholecalciferol (VITAMIN D3) 25 MCG (1000 UNIT) tablet Take 1,000 Units by mouth daily.   Yes [provider]  cyanocobalamin (VITAMIN B12) 1000 MCG tablet Take 1,000 mcg by mouth daily.   Yes [provider]  diltiazem  (DILACOR XR ) 240 MG 24 hr capsule Take 1 capsule (240 mg total) by mouth daily. 05/02/23  Yes O'Neal, Cathay Clonts, MD  gabapentin  (NEURONTIN ) 300 MG capsule Take 300 mg by mouth 2 (two) times daily.   Yes [provider]  ipratropium (ATROVENT ) 0.06 % nasal spray Place 1 spray into both nostrils 3 (three) times daily as needed for rhinitis.   Yes [provider]  levothyroxine  (SYNTHROID ) 50 MCG tablet Take 50 mcg by mouth daily before breakfast.   Yes [provider]  magnesium  gluconate (MAGONATE) 500 (27 Mg) MG TABS tablet Take 500 mg by mouth daily.   Yes [provider]  Multiple Vitamin (MULTIVITAMIN WITH MINERALS) TABS tablet Take 1 tablet by mouth daily with breakfast. One A Day for Women   Yes [provider]  Propylene Glycol, PF, (SYSTANE COMPLETE PF) 0.6 % SOLN Place 1 drop into both eyes 3 (three) times daily as needed (dry/irritated eyes.).   Yes [provider]    Physical Exam: Vitals:   05/06/23 1149 05/06/23 1152 05/06/23 1156 05/06/23 1200  BP:  120/86    Pulse:   83 84  Resp:  15  (!) 32  Temp:   (!) 97.5 F (36.4 C)   TempSrc:   Oral   SpO2:   97% 97%  Weight: 53.1 kg     Height: 5\' 2"  (1.575 m)      Constitutional: Elderly female currently in NAD, calm, comfortable Eyes: PERRL, lids and conjunctivae normal ENMT: Mucous membranes are moist.  Normal dentition.  Neck: normal,  supple. JVD present Respiratory: Patient noted to have rhonchi with some mild expiratory wheezes appreciated.  Patient currently off of BiPAP at this time. Cardiovascular: Irregular irregular.  At least +1 pitting lower extremity edema. Abdomen: no tenderness, no masses palpated.  Bowel sounds positive.  Musculoskeletal: no clubbing / cyanosis. No joint deformity upper and lower extremities. Good ROM, no contractures. Normal muscle tone.  Skin: no rashes, lesions, ulcers. No induration Neurologic: CN 2-12 grossly intact.  Strength 5/5 in all 4.  Psychiatric: Normal judgment and insight. Alert and oriented x 3. Normal mood.   Data Reviewed:  EKG revealed atrial fibrillation at 88 bpm.  Reviewed labs, imaging, and pertinent records as documented.  Assessment and Plan:  Acute respiratory failure with hypoxia secondary to diastolic congestive heart failure Severe tricuspid valve regurgitation Possible community-acquired pneumonia Patient presents with acute worsening of shortness of breath yesterday evening with reports of  cough and increased lower  extremity swelling into her abdomen.  Reported to be treated for 2 rounds of antibiotics.  En route with EMS patient's O2 saturations noted to be in the low 80s with decreased breath sounds for which she was placed on CPAP.  In the emergency department she was placed temporarily on BiPAP due to her work of breathing. Patient had at least 1+ pitting edema in the bilateral lower extremities and JVD on physical exam.  Labs significant for BNP 227.8.  Echocardiogram at this recent and then performed on 4/15 which noted EF to be preserved at 50 to 55% with indeterminate diastolic parameters, moderate to severe tricuspid valve regurgitation,moderate mitral valve regurgitation, and severely dilated right atrium. - Admit to a progressive bed - Continuous pulse oximetry with nasal cannula oxygen maintain O2 saturation greater than 90% - Strict I&O's and daily  weights - Check procalcitonin - Checking CT scan of the chest without contrast - Rocephin  IV - Lasix  40 mg IV given x 1 dose.  Reassess a determine need of further IV diuresis - Appreciate cardiology consultative services we will follow-up for any further recommendations   Lactic acidosis Acute.  Lactic acid elevated at 4.4 on admission.  Blood cultures have been obtained.  Suspect secondary possibly to hypoperfusion in the setting of patient being fluid overloaded versus possibility of infection. - Follow-up blood cultures - Empiric antibiotics of Rocephin  given  Bronchitis Physical exam patient noted to have some mild expiratory wheezes present and rhonchi.  Patient had been given Solu-Medrol 125 mg IV x 1 dose prior to arrival. - Incentive spirometry and flutter valve - Prednisone  - Breathing treatments  - Mucinex   Paroxysmal atrial fibrillation on chronic anticoagulation Patient currently in atrial fibrillation, but appears rate controlled.  Metoprolol  just recently been stopped and she was started on amiodarone  and diltiazem . - Goal potassium at least 4 and magnesium  at least 2 - Continue Eliquis , amiodarone , and diltiazem   Essential hypertension Blood pressures currently maintained.  Medications of lisinopril  and amlodipine  were discontinued during last office visit with Dr. Lucy Sack on 4/18 in the setting of starting patient on amiodarone  and diltiazem . - Continue current medication regimen  Hypothyroidism TSH noted to be 2.2 when checked on 04/02/2023. - Continue levothyroxine   Chronic kidney disease stage IIIb Creatinine noted to be 1.4 with BUN 24 which appears slightly higher than baseline of 1.1-1.3. -Continue to monitor kidney function with diuresis   DVT prophylaxis: Eliquis  Advance Care Planning:   Code Status: Full Code   Consults: Cardiology  Family Communication: Family updated at bedside.  Severity of Illness: The appropriate patient status for this  patient is INPATIENT. Inpatient status is judged to be reasonable and necessary in order to provide the required intensity of service to ensure the patient's safety. The patient's presenting symptoms, physical exam findings, and initial radiographic and laboratory data in the context of their chronic comorbidities is felt to place them at high risk for further clinical deterioration. Furthermore, it is not anticipated that the patient will be medically stable for discharge from the hospital within 2 midnights of admission.   * I certify that at the point of admission it is my clinical judgment that the patient will require inpatient hospital care spanning beyond 2 midnights from the point of admission due to high intensity of service, high risk for further deterioration and high frequency of surveillance required.*  Author: Lena Qualia, MD 05/06/2023 3:47 PM  For on call review [email or web address].

## 2023-05-06 NOTE — Consult Note (Addendum)
 Cardiology Consultation   Patient ID: Kathleen Santana MRN: [id number]; DOB: [birth year]  Admit date: 05/06/2023 Date of Consult: 05/06/2023  PCP:  Vladimir Groves, PA-C   [facility] Providers Cardiologist:  Oneil Bigness, MD        Patient Profile:   Kathleen Santana is a 89 y.o. female with a hx of Persistent atrial fibrillation, hypertension, hyperlipidemia, bronchitis, who is being seen 05/06/2023 for the evaluation of congestive heart failure at the request of Angela Kell C.  History of Present Illness:   Kathleen Santana is a 89 y.o. female with prior cardiac history shown below She initially underwent a cardioversion for the A-fib but only remained in normal sinus rhythm for 1 day.  She had previously been on amiodarone  and Multaq .  Does not recall what effects Multaq  had.  She experienced dizziness and a faintness with amiodarone .  She is on Eliquis  for anticoagulation.  She has blue and white fingers when using metoprolol .  She had a TEE on 04/2023 that showed an LVEF of 50 to 55%, global hypokinesis, mild concentric LVH, left atrium was mildly dilated, right atrium was severely dilated, moderate to severe tricuspid regurg. Was last seen by Dr. Lucy Sack on 04/2023 at that time  metoprolol  was stopped and was started on Cardizem .  Also started on amiodarone  200 mg twice daily.   Presented to the ER today via EMS services for shortness of breath. EMS noticed that she had bilateral rhonchi, a chronic unproductive cough and gave her DuoNeb treatments and Solu-Medrol. Her pulse oxymetry was 90% on room air and was placed on Bi-PAP. On interview she was on nasal canula. She is an active person and often walks more than 3 miles a day. She goes to the [facility] and does volunteer work. Has limited her activities over the past week because of the shortness of breath. Last night she had difficulty sleeping and had PND. Has also had lower extremity edema and abdominal  distension over the past day. Denies orthopnea, chest pain, cough, fever, nausea, vomiting.  Was seen on 04/15/23 by family medicine for an upper respiratory infection and was started on Azithromycin.   Labs showed Potassium4.5, 4.5 (04/22 1205) Creatinine, ser  1.50* (04/22 1205) PLT  268 (04/22 1153) HGB  12.9, 12.9 (04/22 1205) WBC 9.8 (04/22 1153) Troponin I (High Sensitivity)8 (04/22 1153)  Lactic acid was elevated at 4.4 BNP was elevated at 227.8 Chest x-ray showed bibasilar pulmonary infiltrates, atelectasis with small bilateral pleural effusions. EKG showed atrial fibrillation with a rate of 88 and low voltage.    Past Medical History:  Diagnosis Date   Afib (HCC)    Arrhythmia    COVID-19    2022   GERD (gastroesophageal reflux disease)    Hypertension    Hypothyroidism    Lower extremity neuropathy    Osteoporosis    Pneumonia    2021   Rhinitis, non-allergic     Past Surgical History:  Procedure Laterality Date   BACK SURGERY     CARDIOVERSION N/A 04/08/2023   Procedure: CARDIOVERSION;  Surgeon: Hugh Madura, MD;  Location: [facility];  Service: Cardiovascular;  Laterality: N/A;   CATARACT EXTRACTION Bilateral    2021   NOSE SURGERY     OPEN REDUCTION INTERNAL FIXATION (ORIF) METACARPAL Left 01/13/2022   Procedure: OPEN REDUCTION INTERNAL FIXATION (ORIF) METACARPAL, LEFT INDEX AND LONG FINGER;  Surgeon: Arvil Birks, MD;  Location: [facility] OR;  Service: Orthopedics;  Laterality: Left;  REGIONAL WITH IV SEDATION     Home Medications:  Prior to Admission medications   Medication Sig Start Date End Date Taking? Authorizing Provider  diltiazem  (TIAZAC ) 240 MG 24 hr capsule Take 240 mg by mouth daily. 05/02/23  Yes [provider]  albuterol  (VENTOLIN  HFA) 108 (90 Base) MCG/ACT inhaler Inhale 2 puffs into the lungs every 4 (four) hours as needed for wheezing or shortness of breath. 05/02/23 07/31/23  O'NealCathay Clonts, MD  amiodarone  (PACERONE ) 200  MG tablet Take 200 MG by mouth twice daily for 7 days. Then 200 MG by mouth daily. 05/02/23   O'NealCathay Clonts, MD  apixaban  (ELIQUIS ) 2.5 MG TABS tablet Take 1 tablet (2.5 mg total) by mouth 2 (two) times daily. 12/10/22   Jude Norton, NP  cholecalciferol (VITAMIN D3) 25 MCG (1000 UNIT) tablet Take 1,000 Units by mouth daily.    [provider]  cyanocobalamin (VITAMIN B12) 1000 MCG tablet Take 1,000 mcg by mouth daily.    [provider]  diltiazem  (DILACOR XR ) 240 MG 24 hr capsule Take 1 capsule (240 mg total) by mouth daily. 05/02/23   O'NealCathay Clonts, MD  gabapentin  (NEURONTIN ) 300 MG capsule Take 300 mg by mouth 2 (two) times daily.    [provider]  ipratropium (ATROVENT ) 0.06 % nasal spray Place 1 spray into both nostrils 3 (three) times daily as needed for rhinitis.    [provider]  levothyroxine  (SYNTHROID ) 50 MCG tablet Take 50 mcg by mouth daily before breakfast.    [provider]  magnesium  gluconate (MAGONATE) 500 (27 Mg) MG TABS tablet Take 500 mg by mouth daily.    [provider]  Multiple Vitamin (MULTIVITAMIN WITH MINERALS) TABS tablet Take 1 tablet by mouth daily with breakfast. One A Day for Women    [provider]  Multiple Vitamins-Minerals (PRESERVISION AREDS) CAPS Take 1 capsule by mouth in the morning and at bedtime.    [provider]  Propylene Glycol, PF, (SYSTANE COMPLETE PF) 0.6 % SOLN Place 1 drop into both eyes 3 (three) times daily as needed (dry/irritated eyes.).    [provider]    Inpatient Medications: Scheduled Meds:  Continuous Infusions:  PRN Meds:   Allergies:    Allergies  Allergen Reactions   Norco [Hydrocodone-Acetaminophen ] Nausea And Vomiting and Other (See Comments)    Passed out, dizziness   Other Other (See Comments) and Cough    Seasonal allergies- congestion and itchy eyes, also    Social History:   Social History   Socioeconomic  History   Marital status: Widowed    Spouse name: Not on file   Number of children: 2   Years of education: Not on file   Highest education level: Not on file  Occupational History   Occupation: retired  Tobacco Use   Smoking status: Former    Types: Cigarettes   Smokeless tobacco: Never  Substance and Sexual Activity   Alcohol  use: Never   Drug use: Never   Sexual activity: Not on file  Other Topics Concern   Not on file  Social History Narrative   Not on file   Social Drivers of Health   Financial Resource Strain: Low Risk  (01/21/2023)   Received from [facility]   Overall Financial Resource Strain (CARDIA)    Difficulty of Paying Living Expenses: Not very hard  Food Insecurity: No Food Insecurity (01/21/2023)   [facility]  Hunger Vital Sign    Worried About Running Out of Food in the Last Year: Never true    Ran Out of Food in the Last Year: Never true  Transportation Needs: No Transportation Needs (01/21/2023)   Received from [facility]   PRAPARE - Transportation    Lack of Transportation (Medical): No    Lack of Transportation (Non-Medical): No  Physical Activity: Sufficiently Active (01/21/2023)   Received from [facility]   Exercise Vital Sign    Days of Exercise per Week: 3 days    Minutes of Exercise per Session: 60 min  Stress: No Stress Concern Present (01/21/2023)   Received from [facility] - Occupational Stress Questionnaire    Feeling of Stress : Not at all  Social Connections: Socially Integrated (01/21/2023)   Received from [facility]   Social Network    How would you rate your social network (family, work, friends)?: Good participation with social networks  Intimate Partner Violence: Unknown (07/23/2022)   Received from [facility]   HITS    Over the last 12 months how often did your partner physically hurt you?: Not on file    Over the last 12 months how often did your partner insult  you or talk down to you?: Not on file    Over the last 12 months how often did your partner threaten you with physical harm?: Not on file    Over the last 12 months how often did your partner scream or curse at you?: Not on file    Family History:    Family History  Problem Relation Age of Onset   Arrhythmia Mother    Heart disease Father      ROS:  Please see the history of present illness.   All other ROS reviewed and negative.     Physical Exam/Data:   Vitals:   05/06/23 1149 05/06/23 1152 05/06/23 1156 05/06/23 1200  BP:  120/86    Pulse:   83 84  Resp:  15  (!) 32  Temp:   (!) 97.5 F (36.4 C)   TempSrc:   Oral   SpO2:   97% 97%  Weight: 53.1 kg     Height: 5\' 2"  (1.575 m)      No intake or output data in the 24 hours ending 05/06/23 1422    05/06/2023   11:49 AM 05/02/2023    1:13 PM 04/08/2023    9:01 AM  Last 3 Weights  Weight (lbs) 117 lb 117 lb 112 lb  Weight (kg) 53.071 kg 53.071 kg 50.803 kg     Body mass index is 21.4 kg/m.  General:  Elderly frail female, in no acute distress on nasal canula HEENT: normal Neck: JVD to the Jaw Vascular:  Distal pulses 2+ bilaterally Cardiac:  Irregularly irregular rhythm Lungs: expiratory wheezing heard throughout the lungs.  It is worse in the  upper lung lobes and right worse than left. Ext: 2+ bilateral pitting edema Musculoskeletal:  No deformities. Skin: warm and dry  Neuro:   no focal abnormalities noted Psych:  Normal affect   EKG:  The EKG was personally reviewed and demonstrates:  EKG showed atrial fibrillation with a rate of 88 and low voltage. Telemetry:  Telemetry was personally reviewed and demonstrates:  A fib in the 70's to 90's  Relevant CV Studies: Echo pending  Laboratory Data:  High Sensitivity Troponin:   Recent Labs  Lab 05/06/23 1153  TROPONINIHS 8     Chemistry Recent Labs  Lab 05/02/23 1426 05/06/23 1153 05/06/23 1205  NA 136 134* 134*  134*  K 5.3* 4.5 4.5  4.5  CL 100  100 101  CO2 21 21*  --   GLUCOSE 83 147* 148*  BUN 25 24* 27*  CREATININE 1.32* 1.40* 1.50*  CALCIUM 9.2 9.0  --   GFRNONAA  --  35*  --   ANIONGAP  --  13  --     Recent Labs  Lab 05/06/23 1153  PROT 6.5  ALBUMIN 3.8  AST 37  ALT 30  ALKPHOS 64  BILITOT 0.5   Lipids No results for input(s): "CHOL", "TRIG", "HDL", "LABVLDL", "LDLCALC", "CHOLHDL" in the last 168 hours.  Hematology Recent Labs  Lab 05/02/23 1426 05/06/23 1153 05/06/23 1205  WBC 7.7 9.8  --   RBC 3.93 3.89  --   HGB 12.1 12.0 12.9  12.9  HCT 37.4 36.9 38.0  38.0  MCV 95 94.9  --   MCH 30.8 30.8  --   MCHC 32.4 32.5  --   RDW 13.6 15.4  --   PLT 264 268  --    Thyroid No results for input(s): "TSH", "FREET4" in the last 168 hours.  BNP Recent Labs  Lab 05/06/23 1153  BNP 227.8*    DDimer No results for input(s): "DDIMER" in the last 168 hours.   Radiology/Studies:  DG Chest Port 1 View Result Date: 05/06/2023 CLINICAL DATA:  Shortness of breath EXAM: PORTABLE CHEST 1 VIEW COMPARISON:  CT chest January 10, 2022 FINDINGS: Bilateral basilar pulmonary infiltrates and atelectasis with a small bilateral pleural effusions could correlate with pneumonia. Heart and mediastinum normal IMPRESSION: Bilateral basilar pulmonary infiltrates and atelectasis with a small bilateral pleural effusions could correlate with pneumonia. Electronically Signed   By: Fredrich Jefferson M.D.   On: 05/06/2023 13:58     Assessment and Plan:   Kathleen Santana is a 89 y.o. female with a hx of Persistent atrial fibrillation, hypertension, hyperlipidemia who is being seen 05/06/2023 for the evaluation of congestive heart failure at the request of Angela Kell C.  Acute respiratory hypoxia Possible Aspiration Pneumonia vs CAP Moderate to severe tricuspid regurgitation She had a TEE on 04/29/2023 that showed an LVEF of 50 to 55%, global hypokinesis, mild concentric LVH, left atrium was mildly dilated, right atrium was severely  dilated, moderate to severe tricuspid regurgitation. Seen at the ER for worsening shortness of breath, lower extremity edema, abdominal distention, PND.  She had an oxygen saturation of 90% on room air. EMS noticed that she had bilateral rhonchi, a chronic unproductive cough and gave her DuoNeb treatments and Solu-Medrol. was initially placed on BiPAP but was on nasal cannula during visit BNP was elevated at 227.8, Chest x-ray showed bibasilar pulmonary infiltrates, atelectasis with small bilateral pleural effusions. Current weight is 117 lbs and baseline is 112-113 lbs. -- Consider a chest ct to evaluate for pneumonia. -- Consider starting oral Lasix  20 mg daily. -- Expect that the volume overload may be secondary to moderate to severe tricuspid regurgitation.  Persistent atrial fibrillation Noted that she started taking the amiodarone  and Cardizem  and stopped her metoprolol  this morning. EKG showed atrial fibrillation with a rate of 88. -- Continue amiodarone  200mg  daily and Cardizem  240mg  daily -- Continue eliquis  2.5 mg twice daily (correct dose for age and weight)  Hypertension -- Is currently normotensive -- see atrial fibrillation above.  Other conditions managed per  primary   Risk Assessment/Risk Scores:    For questions or updates, please contact [facility] Please consult [email or web address] for contact info under    Signed, Melita Springer, PA-C  05/06/2023 2:22 PM

## 2023-05-07 ENCOUNTER — Inpatient Hospital Stay (HOSPITAL_COMMUNITY)

## 2023-05-07 ENCOUNTER — Other Ambulatory Visit: Payer: Self-pay

## 2023-05-07 DIAGNOSIS — N1832 Chronic kidney disease, stage 3b: Secondary | ICD-10-CM | POA: Diagnosis not present

## 2023-05-07 DIAGNOSIS — J9601 Acute respiratory failure with hypoxia: Secondary | ICD-10-CM | POA: Diagnosis not present

## 2023-05-07 DIAGNOSIS — I5031 Acute diastolic (congestive) heart failure: Secondary | ICD-10-CM | POA: Diagnosis not present

## 2023-05-07 DIAGNOSIS — E872 Acidosis, unspecified: Secondary | ICD-10-CM | POA: Diagnosis not present

## 2023-05-07 LAB — URINALYSIS, ROUTINE W REFLEX MICROSCOPIC
Bilirubin Urine: NEGATIVE
Glucose, UA: NEGATIVE mg/dL
Hgb urine dipstick: NEGATIVE
Ketones, ur: NEGATIVE mg/dL
Leukocytes,Ua: NEGATIVE
Nitrite: NEGATIVE
Protein, ur: NEGATIVE mg/dL
Specific Gravity, Urine: 1.006 (ref 1.005–1.030)
pH: 5 (ref 5.0–8.0)

## 2023-05-07 LAB — HEPATIC FUNCTION PANEL
ALT: 29 U/L (ref 0–44)
AST: 35 U/L (ref 15–41)
Albumin: 3.9 g/dL (ref 3.5–5.0)
Alkaline Phosphatase: 63 U/L (ref 38–126)
Bilirubin, Direct: 0.1 mg/dL (ref 0.0–0.2)
Indirect Bilirubin: 0.3 mg/dL (ref 0.3–0.9)
Total Bilirubin: 0.4 mg/dL (ref 0.0–1.2)
Total Protein: 7.1 g/dL (ref 6.5–8.1)

## 2023-05-07 LAB — BASIC METABOLIC PANEL WITH GFR
Anion gap: 12 (ref 5–15)
BUN: 26 mg/dL — ABNORMAL HIGH (ref 8–23)
CO2: 20 mmol/L — ABNORMAL LOW (ref 22–32)
Calcium: 8.7 mg/dL — ABNORMAL LOW (ref 8.9–10.3)
Chloride: 98 mmol/L (ref 98–111)
Creatinine, Ser: 1.7 mg/dL — ABNORMAL HIGH (ref 0.44–1.00)
GFR, Estimated: 28 mL/min — ABNORMAL LOW (ref >60)
Glucose, Bld: 176 mg/dL — ABNORMAL HIGH (ref 70–99)
Potassium: 4.4 mmol/L (ref 3.5–5.1)
Sodium: 130 mmol/L — ABNORMAL LOW (ref 135–145)

## 2023-05-07 LAB — CBC
HCT: 32.7 % — ABNORMAL LOW (ref 36.0–46.0)
Hemoglobin: 11 g/dL — ABNORMAL LOW (ref 12.0–15.0)
MCH: 31.1 pg (ref 26.0–34.0)
MCHC: 33.6 g/dL (ref 30.0–36.0)
MCV: 92.4 fL (ref 80.0–100.0)
Platelets: 230 10*3/uL (ref 150–400)
RBC: 3.54 MIL/uL — ABNORMAL LOW (ref 3.87–5.11)
RDW: 15.4 % (ref 11.5–15.5)
WBC: 11.8 10*3/uL — ABNORMAL HIGH (ref 4.0–10.5)
nRBC: 0 % (ref 0.0–0.2)

## 2023-05-07 LAB — COOXEMETRY PANEL
Carboxyhemoglobin: 0.8 % (ref 0.5–1.5)
Methemoglobin: <0.7 % (ref 0.0–1.5)
O2 Saturation: 61.2 %
Total hemoglobin: 11.5 g/dL — ABNORMAL LOW (ref 12.0–16.0)

## 2023-05-07 LAB — I-STAT CG4 LACTIC ACID, ED: Lactic Acid, Venous: 4.1 mmol/L (ref 0.5–1.9)

## 2023-05-07 LAB — MAGNESIUM: Magnesium: 2.3 mg/dL (ref 1.7–2.4)

## 2023-05-07 MED ORDER — SODIUM CHLORIDE 0.9% FLUSH
10.0000 mL | Freq: Two times a day (BID) | INTRAVENOUS | Status: DC
  Administered 2023-05-07 – 2023-05-11 (×8): 10 mL

## 2023-05-07 MED ORDER — SODIUM CHLORIDE 0.9% FLUSH: 10.0000 mL | INTRAVENOUS | Status: DC | PRN

## 2023-05-07 MED ORDER — LINEZOLID 600 MG/300ML IV SOLN
600.0000 mg | Freq: Two times a day (BID) | INTRAVENOUS | Status: DC
  Administered 2023-05-07 – 2023-05-08 (×2): 600 mg via INTRAVENOUS
  Filled 2023-05-07 (×2): qty 300

## 2023-05-07 MED ORDER — SODIUM CHLORIDE 0.9 % IV BOLUS
1000.0000 mL | Freq: Once | INTRAVENOUS | Status: AC
  Administered 2023-05-07: 1000 mL via INTRAVENOUS

## 2023-05-07 MED ORDER — CHLORHEXIDINE GLUCONATE CLOTH 2 % EX PADS
6.0000 | MEDICATED_PAD | Freq: Every day | CUTANEOUS | Status: DC
  Administered 2023-05-07 – 2023-05-11 (×5): 6 via TOPICAL

## 2023-05-07 MED ORDER — SODIUM CHLORIDE 0.9 % IV SOLN
2.0000 g | INTRAVENOUS | Status: DC
  Administered 2023-05-07: 2 g via INTRAVENOUS
  Filled 2023-05-07: qty 12.5

## 2023-05-07 NOTE — ED Notes (Signed)
 Critical lactic acid given to PA Erin.

## 2023-05-07 NOTE — Progress Notes (Signed)
 Pharmacy Antibiotic Note  Kathleen Santana is a 89 y.o. female admitted on 05/06/2023 with possible sepsis/PNA  Pharmacy has been consulted for cefepime  dosing (she is also on zyvox )  Plan: -Cefepime  2gm IV q24h -Will follow renal function, cultures and clinical progress   Height: 5\' 2"  (157.5 cm) Weight: 55.9 kg (123 lb 3.8 oz) IBW/kg (Calculated) : 50.1  Temp (24hrs), Avg:97.7 F (36.5 C), Min:97.5 F (36.4 C), Max:97.9 F (36.6 C)  Recent Labs  Lab 05/02/23 1426 05/06/23 1153 05/06/23 1205 05/06/23 1500 05/07/23 0553 05/07/23 1201  WBC 7.7 9.8  --   --  11.8*  --   CREATININE 1.32* 1.40* 1.50*  --  1.70*  --   LATICACIDVEN  --   --  4.4* 4.2*  --  4.1*    Estimated Creatinine Clearance: 16 mL/min (A) (by C-G formula based on SCr of 1.7 mg/dL (H)).    Allergies  Allergen Reactions   Nickel Rash   Norco [Hydrocodone-Acetaminophen ] Nausea And Vomiting and Other (See Comments)    Passed out, dizziness   Other Other (See Comments) and Cough    Seasonal allergies- congestion and itchy eyes, also    Antimicrobials this admission: 4/23 cefepime  4/23 zyvox     Microbiology results: 4/23 urine 4/23 blood x2  Thank you for allowing pharmacy to be a part of this patient's care.  Baxter Limber, PharmD Clinical Pharmacist **Pharmacist phone directory can now be found on [email or web address] ([email or web address]).  Listed under [facility].

## 2023-05-07 NOTE — Progress Notes (Signed)
 Peripherally Inserted Central Catheter Placement  The IV Nurse has discussed with the patient and/or persons authorized to consent for the patient, the purpose of this procedure and the potential benefits and risks involved with this procedure.  The benefits include less needle sticks, lab draws from the catheter, and the patient may be discharged home with the catheter. Risks include, but not limited to, infection, bleeding, blood clot (thrombus formation), and puncture of an artery; nerve damage and irregular heartbeat and possibility to perform a PICC exchange if needed/ordered by physician.  Alternatives to this procedure were also discussed.  Bard Power PICC patient education guide, fact sheet on infection prevention and patient information card has been provided to patient /or left at bedside.    PICC Placement Documentation  PICC Double Lumen 05/07/23 Right Basilic 36 cm 0 cm (Active)  Indication for Insertion or Continuance of Line Vasoactive infusions 05/07/23 1645  Exposed Catheter (cm) 0 cm 05/07/23 1645  Site Assessment Clean, Dry, Intact 05/07/23 1645  Lumen #1 Status Flushed;Saline locked;Blood return noted 05/07/23 1645  Lumen #2 Status Flushed;Saline locked;Blood return noted 05/07/23 1645  Dressing Type Transparent;Securing device 05/07/23 1645  Dressing Status Antimicrobial disc/dressing in place;Clean, Dry, Intact 05/07/23 1645  Line Care Connections checked and tightened 05/07/23 1645  Line Adjustment (NICU/IV Team Only) No 05/07/23 1645  Dressing Intervention New dressing;Adhesive placed at insertion site (IV team only) 05/07/23 1645  Dressing Change Due 05/14/23 05/07/23 1645       Nadean August 05/07/2023, 5:40 PM

## 2023-05-07 NOTE — Progress Notes (Addendum)
 PROGRESS NOTE    Kathleen Santana  ZOX:[id number] DOB: November 09, 1926 DOA: 05/06/2023 PCP: Vladimir Groves, PA-C    89/F w hypertension, persistent A-fib, hypothyroidism, GERD presented to the ED with worsening shortness of breath, abdominal distention and leg swelling -Underwent cardioversion for A-fib last month, quickly reverted back to A-fib again.  Presented to the ED with worsening dyspnea on exertion X 1 to 2 days, has a dry cough, no fevers or chills. - Also treated with 2 rounds of Abx for suspected bronchitis by her PCP - In the ED she was hypoxic, briefly placed on BiPAP, treated with diuretics and steroids  - Labs noted WBC of 9.8, sodium 134, creatinine 1.4, BNP 227, troponin 8, lactic acid 4.4, chest x-ray noted bibasilar infiltrates, atelectasis and small pleural effusions -CT chest noted bilateral pleural effusions and atelectasis  Subjective: -Breathing a little better from yesterday, swelling is down  Assessment and Plan:  Acute respiratory failure with hypoxi diastolic congestive heart failure Severe tricuspid valve regurgitation -Presented to the ED with worsening dyspnea, edema and abdominal distention -Recent echo 4/15 noted EF 50-55%, indeterminate diastolic parameters, moderate to severe TR, moderate MR -CT chest with bilateral pleural effusions, atelectasis - Repeat low-dose IV Lasix  today - Also placed PICC line and check coags, discussed with Dr. Maximo Spar - Will repeat echo to assess RV as well   Lactic acidosis -Acute, persistent -Etiology is unclear, overtly does not appear infectious, echo with EF low normal, making low output less likely -Will check coags, place PICC line -Blood cultures, empiric antibiotics today   Bronchitis - Recently completed couple of courses of antibiotics, clinical suspicion for infectious process at this time is low however given worsening lactic acidosis will empirically treat with antibiotics and repeat blood cultures    Paroxysmal atrial fibrillation on chronic anticoagulation Patient currently in atrial fibrillation, but appears rate controlled.  Metoprolol  just recently been stopped and she was started on amiodarone  and diltiazem . - Goal potassium at least 4 and magnesium  at least 2 - Continue Eliquis , amiodarone , and diltiazem    Hypothyroidism TSH noted to be 2.2 when checked on 04/02/2023. - Continue levothyroxine    Chronic kidney disease stage IIIb Creatinine noted to be 1.4 with BUN 24 which appears slightly higher than baseline of 1.1-1.3. - Creatinine slightly higher, suspect cardiorenal     DVT prophylaxis: Eliquis  Code Status: Full Code    Consults: Cardiology   Family Communication: No family at bedside, called and updated daughter  Procedures:   Antimicrobials:    Objective: Vitals:   05/07/23 0600 05/07/23 0740 05/07/23 1152 05/07/23 1215  BP: 115/77  127/77 123/81  Pulse: (!) 123  (!) 104 (!) 114  Resp: 19  17 (!) 23  Temp:   (!) 97.5 F (36.4 C)   TempSrc:   Oral   SpO2: 96% 96% 97% 98%  Weight:      Height:        Intake/Output Summary (Last 24 hours) at 05/07/2023 1228 Last data filed at 05/06/2023 1553 Gross per 24 hour  Intake 100 ml  Output --  Net 100 ml   Filed Weights   05/06/23 1149  Weight: 53.1 kg    Examination:  General exam: Appears calm and comfortable  HEENT: + JVD Respiratory system: Decreased breath sounds at the bases Cardiovascular system: S1 & S2 heard, irregular Abd: nondistended, soft and nontender.Normal bowel sounds heard. Central nervous system: Alert and oriented. No focal neurological deficits. Extremities: Trace edema Skin: No rashes Psychiatry:  Mood &  affect appropriate.     Data Reviewed:   CBC: Recent Labs  Lab 05/02/23 1426 05/06/23 1153 05/06/23 1205 05/07/23 0553  WBC 7.7 9.8  --  11.8*  NEUTROABS  --  6.4  --   --   HGB 12.1 12.0 12.9  12.9 11.0*  HCT 37.4 36.9 38.0  38.0 32.7*  MCV 95 94.9  --  92.4   PLT 264 268  --  230   Basic Metabolic Panel: Recent Labs  Lab 05/02/23 1426 05/06/23 1153 05/06/23 1205 05/07/23 0553  NA 136 134* 134*  134* 130*  K 5.3* 4.5 4.5  4.5 4.4  CL 100 100 101 98  CO2 21 21*  --  20*  GLUCOSE 83 147* 148* 176*  BUN 25 24* 27* 26*  CREATININE 1.32* 1.40* 1.50* 1.70*  CALCIUM 9.2 9.0  --  8.7*  MG  --   --   --  2.3   GFR: Estimated Creatinine Clearance: 16 mL/min (A) (by C-G formula based on SCr of 1.7 mg/dL (H)). Liver Function Tests: Recent Labs  Lab 05/06/23 1153  AST 37  ALT 30  ALKPHOS 64  BILITOT 0.5  PROT 6.5  ALBUMIN 3.8   No results for input(s): "LIPASE", "AMYLASE" in the last 168 hours. No results for input(s): "AMMONIA" in the last 168 hours. Coagulation Profile: No results for input(s): "INR", "PROTIME" in the last 168 hours. Cardiac Enzymes: No results for input(s): "CKTOTAL", "CKMB", "CKMBINDEX", "TROPONINI" in the last 168 hours. BNP (last 3 results) No results for input(s): "PROBNP" in the last 8760 hours. HbA1C: No results for input(s): "HGBA1C" in the last 72 hours. CBG: No results for input(s): "GLUCAP" in the last 168 hours. Lipid Profile: No results for input(s): "CHOL", "HDL", "LDLCALC", "TRIG", "CHOLHDL", "LDLDIRECT" in the last 72 hours. Thyroid Function Tests: No results for input(s): "TSH", "T4TOTAL", "FREET4", "T3FREE", "THYROIDAB" in the last 72 hours. Anemia Panel: No results for input(s): "VITAMINB12", "FOLATE", "FERRITIN", "TIBC", "IRON", "RETICCTPCT" in the last 72 hours. Urine analysis:    Component Value Date/Time   COLORURINE STRAW (A) 01/10/2022 0630   APPEARANCEUR CLEAR 01/10/2022 0630   LABSPEC 1.028 01/10/2022 0630   PHURINE 8.0 01/10/2022 0630   GLUCOSEU NEGATIVE 01/10/2022 0630   HGBUR NEGATIVE 01/10/2022 0630   BILIRUBINUR NEGATIVE 01/10/2022 0630   KETONESUR NEGATIVE 01/10/2022 0630   PROTEINUR NEGATIVE 01/10/2022 0630   NITRITE NEGATIVE 01/10/2022 0630   LEUKOCYTESUR LARGE (A)  01/10/2022 0630   Sepsis Labs: @LABRCNTIP (procalcitonin:4,lacticidven:4)  ) Recent Results (from the past 240 hours)  Culture, blood (routine x 2)     Status: None (Preliminary result)   Collection Time: 05/06/23 12:06 PM   Specimen: BLOOD  Result Value Ref Range Status   Specimen Description BLOOD SITE NOT SPECIFIED  Final   Special Requests   Final    BOTTLES DRAWN AEROBIC AND ANAEROBIC Blood Culture adequate volume   Culture   Final    NO GROWTH < 24 HOURS Performed at [facility], [address], [place], [place] [address]    Report Status PENDING  Incomplete  Resp panel by RT-PCR (RSV, Flu A&B, Covid) Anterior Nasal Swab     Status: None   Collection Time: 05/06/23  5:18 PM   Specimen: Anterior Nasal Swab  Result Value Ref Range Status   SARS Coronavirus 2 by RT PCR NEGATIVE NEGATIVE Final   Influenza A by PCR NEGATIVE NEGATIVE Final   Influenza B by PCR NEGATIVE NEGATIVE Final  Comment: (NOTE) The Xpert Xpress SARS-CoV-2/FLU/RSV plus assay is intended as an aid in the diagnosis of influenza from Nasopharyngeal swab specimens and should not be used as a sole basis for treatment. Nasal washings and aspirates are unacceptable for Xpert Xpress SARS-CoV-2/FLU/RSV testing.  Fact Sheet for Patients: BloggerCourse.com  Fact Sheet for Healthcare Providers: SeriousBroker.it  This test is not yet approved or cleared by the [place]  FDA and has been authorized for detection and/or diagnosis of SARS-CoV-2 by FDA under an Emergency Use Authorization (EUA). This EUA will remain in effect (meaning this test can be used) for the duration of the COVID-19 declaration under Section 564(b)(1) of the Act, 21 U.S.C. section 360bbb-3(b)(1), unless the authorization is terminated or revoked.     Resp Syncytial Virus by PCR NEGATIVE NEGATIVE Final    Comment: (NOTE) Fact Sheet for  Patients: BloggerCourse.com  Fact Sheet for Healthcare Providers: SeriousBroker.it  This test is not yet approved or cleared by the [place]  FDA and has been authorized for detection and/or diagnosis of SARS-CoV-2 by FDA under an Emergency Use Authorization (EUA). This EUA will remain in effect (meaning this test can be used) for the duration of the COVID-19 declaration under Section 564(b)(1) of the Act, 21 U.S.C. section 360bbb-3(b)(1), unless the authorization is terminated or revoked.  Performed at [facility], [address] [place], [place] [address]      Radiology Studies: CT CHEST WO CONTRAST Result Date: 05/06/2023 CLINICAL DATA:  Respiratory illness EXAM: CT CHEST WITHOUT CONTRAST TECHNIQUE: Multidetector CT imaging of the chest was performed following the standard protocol without IV contrast. RADIATION DOSE REDUCTION: This exam was performed according to the departmental dose-optimization program which includes automated exposure control, adjustment of the mA and/or kV according to patient size and/or use of iterative reconstruction technique. COMPARISON:  05/06/2023, CT 01/10/2022 FINDINGS: Cardiovascular: Limited evaluation without intravenous contrast. Moderate aortic atherosclerosis. No aneurysm. Coronary vascular calcification. Mild cardiomegaly. No sizable pericardial effusion Mediastinum/Nodes: Patent trachea. No thyroid mass. No suspicious lymph nodes. Esophagus within normal limits Lungs/Pleura: Small left and small moderate right pleural effusions. Partial lower lobe consolidations favored to represent passive atelectasis. Scarring or atelectasis at the right middle lobe. No pneumothorax Upper Abdomen: No acute finding Musculoskeletal: No acute osseous abnormality. Multilevel degenerative changes. IMPRESSION: 1. Cardiomegaly with small left and small moderate pleural effusions and probable passive atelectasis at  the lower lobes. Aortic Atherosclerosis (ICD10-I70.0). Electronically Signed   By: Esmeralda Hedge M.D.   On: 05/06/2023 18:15   DG Chest Port 1 View Result Date: 05/06/2023 CLINICAL DATA:  Shortness of breath EXAM: PORTABLE CHEST 1 VIEW COMPARISON:  CT chest January 10, 2022 FINDINGS: Bilateral basilar pulmonary infiltrates and atelectasis with a small bilateral pleural effusions could correlate with pneumonia. Heart and mediastinum normal IMPRESSION: Bilateral basilar pulmonary infiltrates and atelectasis with a small bilateral pleural effusions could correlate with pneumonia. Electronically Signed   By: Fredrich Jefferson M.D.   On: 05/06/2023 13:58     Scheduled Meds:  albuterol   2.5 mg Nebulization BID   amiodarone   200 mg Oral BID   apixaban   2.5 mg Oral BID   diltiazem   240 mg Oral Daily   gabapentin   300 mg Oral BID   guaiFENesin   600 mg Oral BID   levothyroxine   50 mcg Oral QAC breakfast   magnesium  gluconate  500 mg Oral Daily   predniSONE   40 mg Oral Q breakfast   sodium chloride  flush  3 mL Intravenous Q12H  Continuous Infusions:   LOS: 1 day    Time spent:    Deforest Fast, MD [facility]   05/07/2023, 12:28 PM

## 2023-05-07 NOTE — Progress Notes (Signed)
 IV antibiotics delayed d/t PICC line placement and placement verification.

## 2023-05-07 NOTE — Progress Notes (Signed)
 DAILY PROGRESS NOTE   Patient Name: Kathleen Santana Date of Encounter: 05/07/2023 Cardiologist: Oneil Bigness, MD  Chief Complaint   Breathing better  Patient Profile   Kathleen Santana is a 89 y.o. female with a hx of Persistent atrial fibrillation, hypertension, hyperlipidemia, bronchitis, who is being seen 05/06/2023 for the evaluation of congestive heart failure at the request of Angela Kell C.   Subjective   No issues overnight.  Says she feels better, howver, output not recorded - but given lasix . Weight not recorded.  Creatinine higher at 1.7 today. Sodium 130. CT chest shows cardiomegaly with pleural effusions, atelectasis, but no clear pneumonia. Afib rates are faster today. Lactate remains elevated at 4.2, procal is negative. Respiratory viral panel negative. Echo 1 week ago showed normal LV function, moderate MR and moderate to severe TR.   Objective   Vitals:   05/07/23 0500 05/07/23 0530 05/07/23 0600 05/07/23 0740  BP: 121/79 119/78 115/77   Pulse: (!) 49 79 (!) 123   Resp: (!) 23 (!) 22 19   Temp: 97.8 F (36.6 C)     TempSrc:      SpO2: 93% 93% 96% 96%  Weight:      Height:        Intake/Output Summary (Last 24 hours) at 05/07/2023 8295 Last data filed at 05/06/2023 1553 Gross per 24 hour  Intake 100 ml  Output --  Net 100 ml   Filed Weights   05/06/23 1149  Weight: 53.1 kg    Physical Exam   General appearance: alert, no distress, pale, and hard of hearing Neck: JVD - several cm above sternal notch, no carotid bruit, and thyroid not enlarged, symmetric, no tenderness/mass/nodules Lungs: diminished breath sounds bibasilar Heart: irregularly irregular rhythm Abdomen: soft, non-tender; bowel sounds normal; no masses,  no organomegaly Extremities: extremities normal, atraumatic, no cyanosis or edema Pulses: 2+ and symmetric Skin: pale, warm, dry Neurologic: Grossly normal Psych: Pleasant  Inpatient Medications    Scheduled Meds:   albuterol   2.5 mg Nebulization BID   amiodarone   200 mg Oral BID   apixaban   2.5 mg Oral BID   diltiazem   240 mg Oral Daily   gabapentin   300 mg Oral BID   guaiFENesin   600 mg Oral BID   levothyroxine   50 mcg Oral QAC breakfast   magnesium  gluconate  500 mg Oral Daily   predniSONE   40 mg Oral Q breakfast   sodium chloride  flush  3 mL Intravenous Q12H    Continuous Infusions:   PRN Meds: acetaminophen  **OR** acetaminophen , albuterol , polyvinyl alcohol    Labs   Results for orders placed or performed during the hospital encounter of 05/06/23 (from the past 48 hours)  Comprehensive metabolic panel     Status: Abnormal   Collection Time: 05/06/23 11:53 AM  Result Value Ref Range   Sodium 134 (L) 135 - 145 mmol/L   Potassium 4.5 3.5 - 5.1 mmol/L   Chloride 100 98 - 111 mmol/L   CO2 21 (L) 22 - 32 mmol/L   Glucose, Bld 147 (H) 70 - 99 mg/dL    Comment: Glucose reference range applies only to samples taken after fasting for at least 8 hours.   BUN 24 (H) 8 - 23 mg/dL   Creatinine, Ser 6.21 (H) 0.44 - 1.00 mg/dL   Calcium 9.0 8.9 - 30.8 mg/dL   Total Protein 6.5 6.5 - 8.1 g/dL   Albumin 3.8 3.5 - 5.0 g/dL   AST 37  15 - 41 U/L   ALT 30 0 - 44 U/L   Alkaline Phosphatase 64 38 - 126 U/L   Total Bilirubin 0.5 0.0 - 1.2 mg/dL   GFR, Estimated 35 (L) >60 mL/min    Comment: (NOTE) Calculated using the CKD-EPI Creatinine Equation (2021)    Anion gap 13 5 - 15    Comment: Performed at [facility], [address] [place], [place] [address]  CBC with Differential     Status: None   Collection Time: 05/06/23 11:53 AM  Result Value Ref Range   WBC 9.8 4.0 - 10.5 K/uL   RBC 3.89 3.87 - 5.11 MIL/uL   Hemoglobin 12.0 12.0 - 15.0 g/dL   HCT 91.4 78.2 - 95.6 %   MCV 94.9 80.0 - 100.0 fL   MCH 30.8 26.0 - 34.0 pg   MCHC 32.5 30.0 - 36.0 g/dL   RDW 21.3 08.6 - 57.8 %   Platelets 268 150 - 400 K/uL   nRBC 0.0 0.0 - 0.2 %   Neutrophils Relative % 66 %   Neutro Abs 6.4 1.7 - 7.7  K/uL   Lymphocytes Relative 23 %   Lymphs Abs 2.2 0.7 - 4.0 K/uL   Monocytes Relative 10 %   Monocytes Absolute 1.0 0.1 - 1.0 K/uL   Eosinophils Relative 0 %   Eosinophils Absolute 0.0 0.0 - 0.5 K/uL   Basophils Relative 1 %   Basophils Absolute 0.1 0.0 - 0.1 K/uL   Immature Granulocytes 0 %   Abs Immature Granulocytes 0.02 0.00 - 0.07 K/uL    Comment: Performed at [facility], [address], [place], [place] [address]  Troponin I (High Sensitivity)     Status: None   Collection Time: 05/06/23 11:53 AM  Result Value Ref Range   Troponin I (High Sensitivity) 8 <18 ng/L    Comment: (NOTE) Elevated high sensitivity troponin I (hsTnI) values and significant  changes across serial measurements may suggest ACS but many other  chronic and acute conditions are known to elevate hsTnI results.  Refer to the "Links" section for chest pain algorithms and additional  guidance. Performed at [facility], [address] [place], [place] [address]   Brain natriuretic peptide     Status: Abnormal   Collection Time: 05/06/23 11:53 AM  Result Value Ref Range   B Natriuretic Peptide 227.8 (H) 0.0 - 100.0 pg/mL    Comment: Performed at [facility], [address], [place], [place] [address]  I-Stat venous blood gas, ED     Status: Abnormal   Collection Time: 05/06/23 12:05 PM  Result Value Ref Range   pH, Ven 7.379 7.25 - 7.43   pCO2, Ven 37.0 (L) 44 - 60 mmHg   pO2, Ven 38 32 - 45 mmHg   Bicarbonate 21.8 20.0 - 28.0 mmol/L   TCO2 23 22 - 32 mmol/L   O2 Saturation 71 %   Acid-base deficit 3.0 (H) 0.0 - 2.0 mmol/L   Sodium 134 (L) 135 - 145 mmol/L   Potassium 4.5 3.5 - 5.1 mmol/L   Calcium, Ion 1.12 (L) 1.15 - 1.40 mmol/L   HCT 38.0 36.0 - 46.0 %   Hemoglobin 12.9 12.0 - 15.0 g/dL   Sample type VENOUS    Comment NOTIFIED PHYSICIAN   I-stat chem 8, ED     Status: Abnormal   Collection Time: 05/06/23 12:05 PM  Result Value Ref Range   Sodium 134 (L)  135 - 145 mmol/L    Potassium 4.5 3.5 - 5.1 mmol/L   Chloride 101 98 - 111 mmol/L   BUN 27 (H) 8 - 23 mg/dL   Creatinine, Ser 1.61 (H) 0.44 - 1.00 mg/dL   Glucose, Bld 096 (H) 70 - 99 mg/dL    Comment: Glucose reference range applies only to samples taken after fasting for at least 8 hours.   Calcium, Ion 1.11 (L) 1.15 - 1.40 mmol/L   TCO2 22 22 - 32 mmol/L   Hemoglobin 12.9 12.0 - 15.0 g/dL   HCT 04.5 40.9 - 81.1 %  I-Stat Lactic Acid     Status: Abnormal   Collection Time: 05/06/23 12:05 PM  Result Value Ref Range   Lactic Acid, Venous 4.4 (HH) 0.5 - 1.9 mmol/L   Comment NOTIFIED PHYSICIAN   Troponin I (High Sensitivity)     Status: None   Collection Time: 05/06/23  2:55 PM  Result Value Ref Range   Troponin I (High Sensitivity) 6 <18 ng/L    Comment: (NOTE) Elevated high sensitivity troponin I (hsTnI) values and significant  changes across serial measurements may suggest ACS but many other  chronic and acute conditions are known to elevate hsTnI results.  Refer to the "Links" section for chest pain algorithms and additional  guidance. Performed at [facility], [address], [place], [place] [address]   Procalcitonin     Status: None   Collection Time: 05/06/23  2:55 PM  Result Value Ref Range   Procalcitonin <0.10 ng/mL    Comment:        Interpretation: PCT (Procalcitonin) <= 0.5 ng/mL: Systemic infection (sepsis) is not likely. Local bacterial infection is possible. (NOTE)       Sepsis PCT Algorithm           Lower Respiratory Tract                                      Infection PCT Algorithm    ----------------------------     ----------------------------         PCT < 0.25 ng/mL                PCT < 0.10 ng/mL          Strongly encourage             Strongly discourage   discontinuation of antibiotics    initiation of antibiotics    ----------------------------     -----------------------------       PCT 0.25 - 0.50 ng/mL            PCT 0.10 - 0.25 ng/mL               OR        >80% decrease in PCT            Discourage initiation of                                            antibiotics      Encourage discontinuation           of antibiotics    ----------------------------     -----------------------------         PCT >= 0.50 ng/mL  PCT 0.26 - 0.50 ng/mL               AND        <80% decrease in PCT             Encourage initiation of                                             antibiotics       Encourage continuation           of antibiotics    ----------------------------     -----------------------------        PCT >= 0.50 ng/mL                  PCT > 0.50 ng/mL               AND         increase in PCT                  Strongly encourage                                      initiation of antibiotics    Strongly encourage escalation           of antibiotics                                     -----------------------------                                           PCT <= 0.25 ng/mL                                                 OR                                        > 80% decrease in PCT                                      Discontinue / Do not initiate                                             antibiotics  Performed at [facility], [address] [place], [place] [address]   I-Stat Lactic Acid     Status: Abnormal   Collection Time: 05/06/23  3:00 PM  Result Value Ref Range   Lactic Acid, Venous 4.2 (HH) 0.5 - 1.9 mmol/L   Comment NOTIFIED PHYSICIAN   Resp panel by RT-PCR (RSV, Flu A&B, Covid) Anterior Nasal Swab     Status: None   Collection Time: 05/06/23  5:18 PM  Specimen: Anterior Nasal Swab  Result Value Ref Range   SARS Coronavirus 2 by RT PCR NEGATIVE NEGATIVE   Influenza A by PCR NEGATIVE NEGATIVE   Influenza B by PCR NEGATIVE NEGATIVE    Comment: (NOTE) The Xpert Xpress SARS-CoV-2/FLU/RSV plus assay is intended as an aid in the diagnosis of influenza from Nasopharyngeal swab specimens and should not be  used as a sole basis for treatment. Nasal washings and aspirates are unacceptable for Xpert Xpress SARS-CoV-2/FLU/RSV testing.  Fact Sheet for Patients: BloggerCourse.com  Fact Sheet for Healthcare Providers: SeriousBroker.it  This test is not yet approved or cleared by the [place]  FDA and has been authorized for detection and/or diagnosis of SARS-CoV-2 by FDA under an Emergency Use Authorization (EUA). This EUA will remain in effect (meaning this test can be used) for the duration of the COVID-19 declaration under Section 564(b)(1) of the Act, 21 U.S.C. section 360bbb-3(b)(1), unless the authorization is terminated or revoked.     Resp Syncytial Virus by PCR NEGATIVE NEGATIVE    Comment: (NOTE) Fact Sheet for Patients: BloggerCourse.com  Fact Sheet for Healthcare Providers: SeriousBroker.it  This test is not yet approved or cleared by the [place]  FDA and has been authorized for detection and/or diagnosis of SARS-CoV-2 by FDA under an Emergency Use Authorization (EUA). This EUA will remain in effect (meaning this test can be used) for the duration of the COVID-19 declaration under Section 564(b)(1) of the Act, 21 U.S.C. section 360bbb-3(b)(1), unless the authorization is terminated or revoked.  Performed at [facility], [address], [place], [place] [address]   CBC     Status: Abnormal   Collection Time: 05/07/23  5:53 AM  Result Value Ref Range   WBC 11.8 (H) 4.0 - 10.5 K/uL   RBC 3.54 (L) 3.87 - 5.11 MIL/uL   Hemoglobin 11.0 (L) 12.0 - 15.0 g/dL   HCT 60.4 (L) 54.0 - 98.1 %   MCV 92.4 80.0 - 100.0 fL   MCH 31.1 26.0 - 34.0 pg   MCHC 33.6 30.0 - 36.0 g/dL   RDW 19.1 47.8 - 29.5 %   Platelets 230 150 - 400 K/uL   nRBC 0.0 0.0 - 0.2 %    Comment: Performed at [facility], [address] [place], [place] [address]  Basic metabolic panel      Status: Abnormal   Collection Time: 05/07/23  5:53 AM  Result Value Ref Range   Sodium 130 (L) 135 - 145 mmol/L   Potassium 4.4 3.5 - 5.1 mmol/L   Chloride 98 98 - 111 mmol/L   CO2 20 (L) 22 - 32 mmol/L   Glucose, Bld 176 (H) 70 - 99 mg/dL    Comment: Glucose reference range applies only to samples taken after fasting for at least 8 hours.   BUN 26 (H) 8 - 23 mg/dL   Creatinine, Ser 8.65 (H) 0.44 - 1.00 mg/dL   Calcium 8.7 (L) 8.9 - 10.3 mg/dL   GFR, Estimated 28 (L) >60 mL/min    Comment: (NOTE) Calculated using the CKD-EPI Creatinine Equation (2021)    Anion gap 12 5 - 15    Comment: Performed at [facility], [address] [place], [place] [address]  Magnesium      Status: None   Collection Time: 05/07/23  5:53 AM  Result Value Ref Range   Magnesium  2.3 1.7 - 2.4 mg/dL    Comment: Performed at [facility], [address], [place], [place] [address]    ECG   AFib at 92 - Personally Reviewed  Telemetry   Atrial fibrillation - Personally Reviewed  Radiology    CT CHEST WO CONTRAST Result Date: 05/06/2023 CLINICAL DATA:  Respiratory illness EXAM: CT CHEST WITHOUT CONTRAST TECHNIQUE: Multidetector CT imaging of the chest was performed following the standard protocol without IV contrast. RADIATION DOSE REDUCTION: This exam was performed according to the departmental dose-optimization program which includes automated exposure control, adjustment of the mA and/or kV according to patient size and/or use of iterative reconstruction technique. COMPARISON:  05/06/2023, CT 01/10/2022 FINDINGS: Cardiovascular: Limited evaluation without intravenous contrast. Moderate aortic atherosclerosis. No aneurysm. Coronary vascular calcification. Mild cardiomegaly. No sizable pericardial effusion Mediastinum/Nodes: Patent trachea. No thyroid mass. No suspicious lymph nodes. Esophagus within normal limits Lungs/Pleura: Small left and small moderate right pleural effusions. Partial lower  lobe consolidations favored to represent passive atelectasis. Scarring or atelectasis at the right middle lobe. No pneumothorax Upper Abdomen: No acute finding Musculoskeletal: No acute osseous abnormality. Multilevel degenerative changes. IMPRESSION: 1. Cardiomegaly with small left and small moderate pleural effusions and probable passive atelectasis at the lower lobes. Aortic Atherosclerosis (ICD10-I70.0). Electronically Signed   By: Esmeralda Hedge M.D.   On: 05/06/2023 18:15   DG Chest Port 1 View Result Date: 05/06/2023 CLINICAL DATA:  Shortness of breath EXAM: PORTABLE CHEST 1 VIEW COMPARISON:  CT chest January 10, 2022 FINDINGS: Bilateral basilar pulmonary infiltrates and atelectasis with a small bilateral pleural effusions could correlate with pneumonia. Heart and mediastinum normal IMPRESSION: Bilateral basilar pulmonary infiltrates and atelectasis with a small bilateral pleural effusions could correlate with pneumonia. Electronically Signed   By: Fredrich Jefferson M.D.   On: 05/06/2023 13:58    Cardiac Studies   N/A  Assessment   Principal Problem:   Acute respiratory failure (HCC) Active Problems:   Acute diastolic heart failure (HCC)   Pneumonia due to infectious organism   Severe tricuspid valve regurgitation   Bronchitis   Lactic acidosis   Paroxysmal atrial fibrillation (HCC)   Chronic anticoagulation   Essential hypertension   Hypothyroidism   Chronic kidney disease, stage III (moderate) (HCC)   Plan   Says she feels better today, but creatinine much worse - sodium 130 (Down from 134) after lasix  - CT shows pleural effusions - No clear pneumonia.  LVEF low normal - do not suspect low output HF would be an issue. High lactate without clear infectious source, but again, echo would not suggest low output heart failure as the cause of this either. May be sepsis, however, no clear organism yet. Will hold additional lasix  today and give some volume back - monitor lactate.  Time  Spent Directly with Patient:  I have spent a total of 45 minutes with the patient reviewing hospital notes, telemetry, EKGs, labs and examining the patient as well as establishing an assessment and plan that was discussed personally with the patient.  > 50% of time was spent in direct patient care.  Length of Stay:  LOS: 1 day   [name], MD, [facility], FNLA, FACP  [place]  [facility]  Medical Director of the Advanced Lipid Disorders &  Cardiovascular Risk Reduction Clinic Diplomate of the American Board of Clinical Lipidology Attending Cardiologist  Direct Dial: [phone]  Fax: [phone]  Website:  [email or web address] Frappier 05/07/2023, 8:06 AM

## 2023-05-08 ENCOUNTER — Inpatient Hospital Stay (HOSPITAL_COMMUNITY)

## 2023-05-08 DIAGNOSIS — J9601 Acute respiratory failure with hypoxia: Secondary | ICD-10-CM | POA: Diagnosis not present

## 2023-05-08 DIAGNOSIS — J189 Pneumonia, unspecified organism: Secondary | ICD-10-CM | POA: Diagnosis not present

## 2023-05-08 DIAGNOSIS — I5031 Acute diastolic (congestive) heart failure: Secondary | ICD-10-CM | POA: Diagnosis not present

## 2023-05-08 DIAGNOSIS — N1832 Chronic kidney disease, stage 3b: Secondary | ICD-10-CM | POA: Diagnosis not present

## 2023-05-08 DIAGNOSIS — D72829 Elevated white blood cell count, unspecified: Secondary | ICD-10-CM

## 2023-05-08 DIAGNOSIS — E872 Acidosis, unspecified: Secondary | ICD-10-CM | POA: Diagnosis not present

## 2023-05-08 LAB — COMPREHENSIVE METABOLIC PANEL WITH GFR
ALT: 30 U/L (ref 0–44)
AST: 32 U/L (ref 15–41)
Albumin: 3.3 g/dL — ABNORMAL LOW (ref 3.5–5.0)
Alkaline Phosphatase: 54 U/L (ref 38–126)
Anion gap: 13 (ref 5–15)
BUN: 33 mg/dL — ABNORMAL HIGH (ref 8–23)
CO2: 19 mmol/L — ABNORMAL LOW (ref 22–32)
Calcium: 8.6 mg/dL — ABNORMAL LOW (ref 8.9–10.3)
Chloride: 91 mmol/L — ABNORMAL LOW (ref 98–111)
Creatinine, Ser: 1.54 mg/dL — ABNORMAL HIGH (ref 0.44–1.00)
GFR, Estimated: 31 mL/min — ABNORMAL LOW (ref >60)
Glucose, Bld: 199 mg/dL — ABNORMAL HIGH (ref 70–99)
Potassium: 5 mmol/L (ref 3.5–5.1)
Sodium: 123 mmol/L — ABNORMAL LOW (ref 135–145)
Total Bilirubin: 0.5 mg/dL (ref 0.0–1.2)
Total Protein: 6.2 g/dL — ABNORMAL LOW (ref 6.5–8.1)

## 2023-05-08 LAB — URINE CULTURE: Culture: NO GROWTH

## 2023-05-08 LAB — CBC WITH DIFFERENTIAL/PLATELET
Abs Immature Granulocytes: 0.2 10*3/uL — ABNORMAL HIGH (ref 0.00–0.07)
Basophils Absolute: 0 10*3/uL (ref 0.0–0.1)
Basophils Relative: 0 %
Eosinophils Absolute: 0 10*3/uL (ref 0.0–0.5)
Eosinophils Relative: 0 %
HCT: 33.3 % — ABNORMAL LOW (ref 36.0–46.0)
Hemoglobin: 11.4 g/dL — ABNORMAL LOW (ref 12.0–15.0)
Immature Granulocytes: 1 %
Lymphocytes Relative: 3 %
Lymphs Abs: 0.7 10*3/uL (ref 0.7–4.0)
MCH: 31.1 pg (ref 26.0–34.0)
MCHC: 34.2 g/dL (ref 30.0–36.0)
MCV: 91 fL (ref 80.0–100.0)
Monocytes Absolute: 1 10*3/uL (ref 0.1–1.0)
Monocytes Relative: 4 %
Neutro Abs: 22.7 10*3/uL — ABNORMAL HIGH (ref 1.7–7.7)
Neutrophils Relative %: 92 %
Platelets: 257 10*3/uL (ref 150–400)
RBC: 3.66 MIL/uL — ABNORMAL LOW (ref 3.87–5.11)
RDW: 15.3 % (ref 11.5–15.5)
WBC: 24.7 10*3/uL — ABNORMAL HIGH (ref 4.0–10.5)
nRBC: 0 % (ref 0.0–0.2)

## 2023-05-08 LAB — RESPIRATORY PANEL BY PCR

## 2023-05-08 LAB — CBC
HCT: 32.4 % — ABNORMAL LOW (ref 36.0–46.0)
Hemoglobin: 10.9 g/dL — ABNORMAL LOW (ref 12.0–15.0)
MCH: 30.3 pg (ref 26.0–34.0)
MCHC: 33.6 g/dL (ref 30.0–36.0)
MCV: 90 fL (ref 80.0–100.0)
Platelets: 258 10*3/uL (ref 150–400)
RBC: 3.6 MIL/uL — ABNORMAL LOW (ref 3.87–5.11)
RDW: 15.3 % (ref 11.5–15.5)
WBC: 24.1 10*3/uL — ABNORMAL HIGH (ref 4.0–10.5)
nRBC: 0 % (ref 0.0–0.2)

## 2023-05-08 LAB — LACTIC ACID, PLASMA
Lactic Acid, Venous: 2.7 mmol/L (ref 0.5–1.9)
Lactic Acid, Venous: 2.9 mmol/L (ref 0.5–1.9)

## 2023-05-08 LAB — BRAIN NATRIURETIC PEPTIDE: B Natriuretic Peptide: 285.1 pg/mL — ABNORMAL HIGH (ref 0.0–100.0)

## 2023-05-08 LAB — COOXEMETRY PANEL
Carboxyhemoglobin: 1.3 % (ref 0.5–1.5)
Methemoglobin: <0.7 % (ref 0.0–1.5)
O2 Saturation: 64.5 %
Total hemoglobin: 11.4 g/dL — ABNORMAL LOW (ref 12.0–16.0)

## 2023-05-08 LAB — MRSA NEXT GEN BY PCR, NASAL: MRSA by PCR Next Gen: NOT DETECTED

## 2023-05-08 LAB — PROCALCITONIN: Procalcitonin: <0.1 ng/mL

## 2023-05-08 MED ORDER — ALUM & MAG HYDROXIDE-SIMETH 200-200-20 MG/5ML PO SUSP
30.0000 mL | Freq: Four times a day (QID) | ORAL | Status: DC | PRN
  Administered 2023-05-08: 30 mL via ORAL
  Filled 2023-05-08: qty 30

## 2023-05-08 MED ORDER — SODIUM CHLORIDE 0.9 % IV SOLN
1.5000 g | Freq: Two times a day (BID) | INTRAVENOUS | Status: DC
  Administered 2023-05-08 – 2023-05-09 (×2): 1.5 g via INTRAVENOUS
  Filled 2023-05-08 (×3): qty 4

## 2023-05-08 NOTE — Evaluation (Signed)
 Physical Therapy Evaluation Patient Details Name: Kathleen Santana MRN: [id number] DOB: Nov 24, 1926 Today's Date: 05/08/2023  History of Present Illness  Kathleen Santana is a 89 y.o. female admitted 05/06/23 for acute respiratory failure. Pt presented with worsening dyspnea, edema, and abdominal distension. Pt underwent cardioversion last month but quickly reverted back to A-Fib. Chest x-ray noted bibasilar infiltrates, atelectasis, and small pleural effusions. CT chest noted bilateral pleural effusions and atelectasis.  Pt had two round of antibiotics for suspected bronchitis by her PCP. PMH significant for hypertension, persistent atrial fibrillation on chronic anticoagulation, hypothyroidism, osteoporosis, and GERD.   Clinical Impression  Pt admitted with above diagnosis. PTA, pt was independent with functional mobility, ADLs, and IADLs including driving. She reports walking ~3 miles three days a week with a walking stick. She lives alone in a one story house with 1 STE and no rails. Pt currently with functional limitations due to the deficits listed below (see PT Problem List). She required HHA for stability and CGA for safety with all OOB mobility. Pt ambulated ~252ft drifting R/L in hallway and demonstrating increasing unsteadiness as she fatigued. Instructed pt to use RW for increased support with ambulation currently. Pt ascended/descended 8 steps with BUE support. Pt appears to be slightly below baseline. She will benefit from acute skilled PT to increase her independence and safety with mobility to allow discharge. Her daughter intends for her to stay with them temporarily as she recovers. Recommend OPPT for balance training to decrease fall risk and increase endurance.       If plan is discharge home, recommend the following: A little help with walking and/or transfers;Assist for transportation;Help with stairs or ramp for entrance   Can travel by private vehicle        Equipment  Recommendations None recommended by PT (Pt already has DME)  Recommendations for Other Services       Functional Status Assessment Patient has had a recent decline in their functional status and demonstrates the ability to make significant improvements in function in a reasonable and predictable amount of time.     Precautions / Restrictions Precautions Precautions: Fall Recall of Precautions/Restrictions: Impaired Restrictions Weight Bearing Restrictions Per Provider Order: No      Mobility  Bed Mobility Overal bed mobility: Modified Independent             General bed mobility comments: HOB elevated, no use of bedrails.    Transfers Overall transfer level: Needs assistance Equipment used: 1 person hand held assist Transfers: Sit to/from Stand Sit to Stand: Contact guard assist           General transfer comment: Pt stood from lowest bed height pushing up with BUE support. Good eccentric control with sitting.    Ambulation/Gait Ambulation/Gait assistance: Contact guard assist Gait Distance (Feet): 250 Feet Assistive device: 1 person hand held assist Gait Pattern/deviations: Step-through pattern, Decreased stride length, Narrow base of support, Drifts right/left Gait velocity: decreased Gait velocity interpretation: <1.8 ft/sec, indicate of risk for recurrent falls   General Gait Details: Pt ambulated with short quick steps. She relied on RUE support for increased stability and CGA for safety. Pt drifted R/L in the hallway and kept her feet close together. She navigated obstacles in hallway/room with intermittent assitance. No overt LOB, but increasing unsteadiness as pt fatigued. Pt required standing rest break with cues for PLB technique to aid in recover.  Stairs Stairs: Yes Stairs assistance: Contact guard assist Stair Management: One rail Right, Forwards, Step to  pattern Number of Stairs: 8 General stair comments: Pt ascended/descended leading with RLE and  BUE support, one on handrail and one with HHA. Educated pt on positioning of family members to assist her in entering/exiting the house.  Wheelchair Mobility     Tilt Bed    Modified Rankin (Stroke Patients Only)       Balance Overall balance assessment: Needs assistance Sitting-balance support: Single extremity supported, Feet supported Sitting balance-Leahy Scale: Fair Sitting balance - Comments: Pt sat EOB with supervision   Standing balance support: Single extremity supported, Bilateral upper extremity supported, During functional activity Standing balance-Leahy Scale: Fair Standing balance comment: Pt relied on unilateral UE support during OOB mobility. She drifted R/L during gait and was noteably unsteady, which increased as she fatigued.                             Pertinent Vitals/Pain Pain Assessment Pain Assessment: No/denies pain    Home Living Family/patient expects to be discharged to:: Private residence Living Arrangements: Alone Available Help at Discharge: Family;Available PRN/intermittently Type of Home: House Home Access: Stairs to enter Entrance Stairs-Rails: None Entrance Stairs-Number of Steps: 1   Home Layout: One level Home Equipment: Agricultural consultant (2 wheels) Additional Comments: Per pt's daughter the plan is for her to d/c to daughter's house for now where there is a designated suite for her which is all one level and 2 STE.    Prior Function Prior Level of Function : Independent/Modified Independent;Driving             Mobility Comments: Ambulates without an AD. Pt has an active lifestyle and walks 3 mile 3x/week with one walking stick. She reports 1 fall in the last 44mo without injury. ADLs Comments: Indep with ADLs/IADLs.     Extremity/Trunk Assessment   Upper Extremity Assessment Upper Extremity Assessment: Overall WFL for tasks assessed;Right hand dominant    Lower Extremity Assessment Lower Extremity Assessment:  Overall WFL for tasks assessed    Cervical / Trunk Assessment Cervical / Trunk Assessment: Normal  Communication   Communication Communication: Impaired Factors Affecting Communication: Hearing impaired (wearing hearing aid)    Cognition Arousal: Alert Behavior During Therapy: WFL for tasks assessed/performed   PT - Cognitive impairments: No apparent impairments                       PT - Cognition Comments: Pt A,Ox4 Following commands: Intact       Cueing Cueing Techniques: Verbal cues     General Comments General comments (skin integrity, edema, etc.): VSS on RA. Pt reported a 8/10 on the modified RPE scale following mobility. Pt's daughter present and supportive throughout session.    Exercises     Assessment/Plan    PT Assessment Patient needs continued PT services  PT Problem List Decreased activity tolerance;Decreased balance;Decreased knowledge of use of DME;Decreased mobility       PT Treatment Interventions DME instruction;Gait training;Stair training;Functional mobility training;Therapeutic activities;Therapeutic exercise;Patient/family education    PT Goals (Current goals can be found in the Care Plan section)  Acute Rehab PT Goals Patient Stated Goal: Return Home and feel stronger PT Goal Formulation: With patient/family Time For Goal Achievement: 05/22/23 Potential to Achieve Goals: Good    Frequency Min 1X/week     Co-evaluation               AM-PAC PT "6 Clicks" Mobility  Outcome Measure Help needed  turning from your back to your side while in a flat bed without using bedrails?: None Help needed moving from lying on your back to sitting on the side of a flat bed without using bedrails?: None Help needed moving to and from a bed to a chair (including a wheelchair)?: A Little Help needed standing up from a chair using your arms (e.g., wheelchair or bedside chair)?: A Little Help needed to walk in hospital room?: A Little Help  needed climbing 3-5 steps with a railing? : A Little 6 Click Score: 20    End of Session Equipment Utilized During Treatment: Gait belt Activity Tolerance: Patient tolerated treatment well Patient left: in bed;with call bell/phone within reach;with family/visitor present Nurse Communication: Mobility status PT Visit Diagnosis: Unsteadiness on feet (R26.81);Difficulty in walking, not elsewhere classified (R26.2)    Time: 4098-1191 PT Time Calculation (min) (ACUTE ONLY): 18 min   Charges:   PT Evaluation $PT Eval Low Complexity: 1 Low   PT General Charges $$ ACUTE PT VISIT: 1 Visit         Glenford Lanes, PT, DPT Acute Rehabilitation Services Office: [phone] Secure Chat Preferred  Riva Chester 05/08/2023, 4:51 PM

## 2023-05-08 NOTE — Progress Notes (Addendum)
 PROGRESS NOTE    Kathleen Santana  QMV:[id number] DOB: [birth year] DOA: 05/06/2023 PCP: Vladimir Groves, PA-C    89/F w hypertension, persistent A-fib, hypothyroidism, GERD presented to the ED with worsening shortness of breath, abdominal distention and leg swelling -Underwent cardioversion for A-fib last month, quickly reverted back to A-fib again.  Presented to the ED with worsening dyspnea on exertion X 1 to 2 days, has a dry cough, no fevers or chills. - Also treated with 2 rounds of Abx for suspected bronchitis by her PCP - In the ED she was hypoxic, briefly placed on BiPAP, treated with diuretics and steroids  - Labs noted WBC of 9.8, sodium 134, creatinine 1.4, BNP 227, troponin 8, lactic acid 4.4, chest x-ray noted bibasilar infiltrates, atelectasis and small pleural effusions -CT chest noted bilateral pleural effusions and atelectasis  Subjective: -Breathing better from yesterday, swelling is down  Assessment and Plan:  Acute respiratory failure with hypoxia diastolic congestive heart failure Severe tricuspid valve regurgitation -Presented to the ED with worsening dyspnea, edema and abdominal distention -Recent echo 4/15 noted EF 50-55%, indeterminate diastolic parameters, moderate to severe TR, moderate MR -CT chest with bilateral pleural effusions, atelectasis - Repeated low-dose IV Lasix  yesterday, clinically improving, - Co. ox in the 60s   Lactic acidosis, leukocytosis ?  Persistent bronchitis, pneumonia -CT chest with atelectasis and pleural effusions, Rx w/ 2 courses of Abx recently -Clinically does not appear to be in shock, Septic or cardiogenic - PICC line placed yesterday to check co-ox which is acceptable - Follow-up blood cultures, day 2 of cefepime /linezolid  -WBC is considerably worse today, did get some steroids on admission, d/w with infectious disease, will stop linezolid  -? Delayed leukemoid reaction, still doesn't explain lactic acidosis -check SLP  eval   Paroxysmal atrial fibrillation on chronic anticoagulation Patient currently in atrial fibrillation, but appears rate controlled.  -Continue Eliquis  amiodarone  and diltiazem   Hypothyroidism TSH noted to be 2.2 when checked on 04/02/2023. - Continue levothyroxine    Chronic kidney disease stage IIIb -baseline of 1.1-1.3. -monitor    DVT prophylaxis: Eliquis  Code Status: Full Code    Consults: Cardiology   Family Communication: No family at bedside, called and updated daughter yesterday Disposition    Objective: Vitals:   05/08/23 0357 05/08/23 0505 05/08/23 0800 05/08/23 0927  BP: 122/71  107/74   Pulse:   73   Resp: 17  16   Temp: 97.7 F (36.5 C)  97.6 F (36.4 C)   TempSrc: Oral  Oral   SpO2: 94%  95% 94%  Weight:  55.5 kg    Height:        Intake/Output Summary (Last 24 hours) at 05/08/2023 0936 Last data filed at 05/08/2023 0850 Gross per 24 hour  Intake 1660 ml  Output 1300 ml  Net 360 ml   Filed Weights   05/06/23 1149 05/07/23 1238 05/08/23 0505  Weight: 53.1 kg 55.9 kg 55.5 kg    Examination:  General exam: Appears calm and comfortable, AAO x 3, hard of hearing HEENT: + JVD Respiratory system: Decreased breath sounds at the bases Cardiovascular system: S1 & S2 heard, irregular Abd: nondistended, soft and nontender.Normal bowel sounds heard. Central nervous system: Alert and oriented. No focal neurological deficits. Extremities:  no edema Skin: No rashes Psychiatry:  Mood & affect appropriate.     Data Reviewed:   CBC: Recent Labs  Lab 05/02/23 1426 05/06/23 1153 05/06/23 1205 05/07/23 0553 05/08/23 0605 05/08/23 0817  WBC 7.7 9.8  --  11.8* 24.1* 24.7*  NEUTROABS  --  6.4  --   --   --  22.7*  HGB 12.1 12.0 12.9  12.9 11.0* 10.9* 11.4*  HCT 37.4 36.9 38.0  38.0 32.7* 32.4* 33.3*  MCV 95 94.9  --  92.4 90.0 91.0  PLT 264 268  --  230 258 257   Basic Metabolic Panel: Recent Labs  Lab 05/02/23 1426 05/06/23 1153  05/06/23 1205 05/07/23 0553 05/08/23 0605  NA 136 134* 134*  134* 130* 123*  K 5.3* 4.5 4.5  4.5 4.4 5.0  CL 100 100 101 98 91*  CO2 21 21*  --  20* 19*  GLUCOSE 83 147* 148* 176* 199*  BUN 25 24* 27* 26* 33*  CREATININE 1.32* 1.40* 1.50* 1.70* 1.54*  CALCIUM 9.2 9.0  --  8.7* 8.6*  MG  --   --   --  2.3  --    GFR: Estimated Creatinine Clearance: 17.7 mL/min (A) (by C-G formula based on SCr of 1.54 mg/dL (H)). Liver Function Tests: Recent Labs  Lab 05/06/23 1153 05/07/23 1358 05/08/23 0605  AST 37 35 32  ALT 30 29 30   ALKPHOS 64 63 54  BILITOT 0.5 0.4 0.5  PROT 6.5 7.1 6.2*  ALBUMIN 3.8 3.9 3.3*   No results for input(s): "LIPASE", "AMYLASE" in the last 168 hours. No results for input(s): "AMMONIA" in the last 168 hours. Coagulation Profile: No results for input(s): "INR", "PROTIME" in the last 168 hours. Cardiac Enzymes: No results for input(s): "CKTOTAL", "CKMB", "CKMBINDEX", "TROPONINI" in the last 168 hours. BNP (last 3 results) No results for input(s): "PROBNP" in the last 8760 hours. HbA1C: No results for input(s): "HGBA1C" in the last 72 hours. CBG: No results for input(s): "GLUCAP" in the last 168 hours. Lipid Profile: No results for input(s): "CHOL", "HDL", "LDLCALC", "TRIG", "CHOLHDL", "LDLDIRECT" in the last 72 hours. Thyroid Function Tests: No results for input(s): "TSH", "T4TOTAL", "FREET4", "T3FREE", "THYROIDAB" in the last 72 hours. Anemia Panel: No results for input(s): "VITAMINB12", "FOLATE", "FERRITIN", "TIBC", "IRON", "RETICCTPCT" in the last 72 hours. Urine analysis:    Component Value Date/Time   COLORURINE STRAW (A) 05/07/2023 1444   APPEARANCEUR CLEAR 05/07/2023 1444   LABSPEC 1.006 05/07/2023 1444   PHURINE 5.0 05/07/2023 1444   GLUCOSEU NEGATIVE 05/07/2023 1444   HGBUR NEGATIVE 05/07/2023 1444   BILIRUBINUR NEGATIVE 05/07/2023 1444   KETONESUR NEGATIVE 05/07/2023 1444   PROTEINUR NEGATIVE 05/07/2023 1444   NITRITE NEGATIVE  05/07/2023 1444   LEUKOCYTESUR NEGATIVE 05/07/2023 1444   Sepsis Labs: @LABRCNTIP (procalcitonin:4,lacticidven:4)  ) Recent Results (from the past 240 hours)  Culture, blood (routine x 2)     Status: None (Preliminary result)   Collection Time: 05/06/23 12:06 PM   Specimen: BLOOD  Result Value Ref Range Status   Specimen Description BLOOD SITE NOT SPECIFIED  Final   Special Requests   Final    BOTTLES DRAWN AEROBIC AND ANAEROBIC Blood Culture adequate volume   Culture   Final    NO GROWTH < 24 HOURS Performed at [facility], [address], [place], [place] [address]    Report Status PENDING  Incomplete  Resp panel by RT-PCR (RSV, Flu A&B, Covid) Anterior Nasal Swab     Status: None   Collection Time: 05/06/23  5:18 PM   Specimen: Anterior Nasal Swab  Result Value Ref Range Status   SARS Coronavirus 2 by RT PCR NEGATIVE NEGATIVE Final   Influenza A by PCR NEGATIVE NEGATIVE  Final   Influenza B by PCR NEGATIVE NEGATIVE Final    Comment: (NOTE) The Xpert Xpress SARS-CoV-2/FLU/RSV plus assay is intended as an aid in the diagnosis of influenza from Nasopharyngeal swab specimens and should not be used as a sole basis for treatment. Nasal washings and aspirates are unacceptable for Xpert Xpress SARS-CoV-2/FLU/RSV testing.  Fact Sheet for Patients: BloggerCourse.com  Fact Sheet for Healthcare Providers: SeriousBroker.it  This test is not yet approved or cleared by the [place]  FDA and has been authorized for detection and/or diagnosis of SARS-CoV-2 by FDA under an Emergency Use Authorization (EUA). This EUA will remain in effect (meaning this test can be used) for the duration of the COVID-19 declaration under Section 564(b)(1) of the Act, 21 U.S.C. section 360bbb-3(b)(1), unless the authorization is terminated or revoked.     Resp Syncytial Virus by PCR NEGATIVE NEGATIVE Final    Comment: (NOTE) Fact Sheet for  Patients: BloggerCourse.com  Fact Sheet for Healthcare Providers: SeriousBroker.it  This test is not yet approved or cleared by the [place]  FDA and has been authorized for detection and/or diagnosis of SARS-CoV-2 by FDA under an Emergency Use Authorization (EUA). This EUA will remain in effect (meaning this test can be used) for the duration of the COVID-19 declaration under Section 564(b)(1) of the Act, 21 U.S.C. section 360bbb-3(b)(1), unless the authorization is terminated or revoked.  Performed at [facility], [address] [place], [place] [address]   Urine Culture (for pregnant, neutropenic or urologic patients or patients with an indwelling urinary catheter)     Status: None (Preliminary result)   Collection Time: 05/07/23  2:21 PM   Specimen: Urine, Clean Catch  Result Value Ref Range Status   Specimen Description URINE, CLEAN CATCH  Final   Special Requests NONE  Final   Culture   Final    NO GROWTH < 24 HOURS Performed at [facility], [address] [place], [place] [address]    Report Status PENDING  Incomplete     Radiology Studies: DG CHEST PORT 1 VIEW Result Date: 05/07/2023 CLINICAL DATA:  Status post PICC line EXAM: PORTABLE CHEST 1 VIEW COMPARISON:  May 06, 2023 FINDINGS: Persistent bilateral basilar infiltrates and atelectasis small bilateral pleural effusions and reactions Heart normal size No significant congestive changes Right PICC line tip in the SVC cavoatrial junction. IMPRESSION: Right PICC line tip in the SVC cavoatrial junction. Electronically Signed   By: Fredrich Jefferson M.D.   On: 05/07/2023 18:11   US  EKG SITE RITE Result Date: 05/07/2023 If Site Rite image not attached, placement could not be confirmed due to current cardiac rhythm.  CT CHEST WO CONTRAST Result Date: 05/06/2023 CLINICAL DATA:  Respiratory illness EXAM: CT CHEST WITHOUT CONTRAST TECHNIQUE: Multidetector CT  imaging of the chest was performed following the standard protocol without IV contrast. RADIATION DOSE REDUCTION: This exam was performed according to the departmental dose-optimization program which includes automated exposure control, adjustment of the mA and/or kV according to patient size and/or use of iterative reconstruction technique. COMPARISON:  05/06/2023, CT 01/10/2022 FINDINGS: Cardiovascular: Limited evaluation without intravenous contrast. Moderate aortic atherosclerosis. No aneurysm. Coronary vascular calcification. Mild cardiomegaly. No sizable pericardial effusion Mediastinum/Nodes: Patent trachea. No thyroid mass. No suspicious lymph nodes. Esophagus within normal limits Lungs/Pleura: Small left and small moderate right pleural effusions. Partial lower lobe consolidations favored to represent passive atelectasis. Scarring or atelectasis at the right middle lobe. No pneumothorax Upper Abdomen: No acute finding Musculoskeletal: No acute  osseous abnormality. Multilevel degenerative changes. IMPRESSION: 1. Cardiomegaly with small left and small moderate pleural effusions and probable passive atelectasis at the lower lobes. Aortic Atherosclerosis (ICD10-I70.0). Electronically Signed   By: Esmeralda Hedge M.D.   On: 05/06/2023 18:15   DG Chest Port 1 View Result Date: 05/06/2023 CLINICAL DATA:  Shortness of breath EXAM: PORTABLE CHEST 1 VIEW COMPARISON:  CT chest January 10, 2022 FINDINGS: Bilateral basilar pulmonary infiltrates and atelectasis with a small bilateral pleural effusions could correlate with pneumonia. Heart and mediastinum normal IMPRESSION: Bilateral basilar pulmonary infiltrates and atelectasis with a small bilateral pleural effusions could correlate with pneumonia. Electronically Signed   By: Fredrich Jefferson M.D.   On: 05/06/2023 13:58     Scheduled Meds:  albuterol   2.5 mg Nebulization BID   amiodarone   200 mg Oral BID   apixaban   2.5 mg Oral BID   Chlorhexidine  Gluconate Cloth   6 each Topical Daily   diltiazem   240 mg Oral Daily   gabapentin   300 mg Oral BID   guaiFENesin   600 mg Oral BID   levothyroxine   50 mcg Oral QAC breakfast   magnesium  gluconate  500 mg Oral Daily   sodium chloride  flush  10-40 mL Intracatheter Q12H   sodium chloride  flush  10-40 mL Intracatheter Q12H   sodium chloride  flush  3 mL Intravenous Q12H   Continuous Infusions:  ceFEPime  (MAXIPIME ) IV 2 g (05/07/23 1842)   linezolid  (ZYVOX ) IV 600 mg (05/08/23 0501)     LOS: 2 days    Time spent:    Deforest Fast, MD [facility]   05/08/2023, 9:36 AM

## 2023-05-08 NOTE — Evaluation (Signed)
 Clinical/Bedside Swallow Evaluation Patient Details  Name: JAQUAYA COYLE MRN: [id number] Date of Birth: 1926-01-27  Today's Date: 05/08/2023 Time: SLP Start Time (ACUTE ONLY): 1440 SLP Stop Time (ACUTE ONLY): 1450 SLP Time Calculation (min) (ACUTE ONLY): 10 min  Past Medical History:  Past Medical History:  Diagnosis Date   Afib (HCC)    Arrhythmia    COVID-19    2022   GERD (gastroesophageal reflux disease)    Hypertension    Hypothyroidism    Lower extremity neuropathy    Osteoporosis    Pneumonia    2021   Rhinitis, non-allergic    Past Surgical History:  Past Surgical History:  Procedure Laterality Date   BACK SURGERY     CARDIOVERSION N/A 04/08/2023   Procedure: CARDIOVERSION;  Surgeon: Hugh Madura, MD;  Location: [facility];  Service: Cardiovascular;  Laterality: N/A;   CATARACT EXTRACTION Bilateral    2021   NOSE SURGERY     OPEN REDUCTION INTERNAL FIXATION (ORIF) METACARPAL Left 01/13/2022   Procedure: OPEN REDUCTION INTERNAL FIXATION (ORIF) METACARPAL, LEFT INDEX AND LONG FINGER;  Surgeon: Arvil Birks, MD;  Location: [facility] OR;  Service: Orthopedics;  Laterality: Left;  REGIONAL WITH IV SEDATION   HPI:  89 y.o. female presented to the ED with worsening SOB, abdominal distention and leg swelling. Dx ARF with hypoxia, severe tricuspid valve regurgitation, lactic acidosis with concern for pna. PMHx hypertension, persistent A-fib, hypothyroidism, GERD, chronic kidney dz stage IIIb    Assessment / Plan / Recommendation  Clinical Impression  Pt states she occasionally experiences a globus sensation but otherwise denies dysphagia. Observed pt feeding herself trials of thin liquids, purees, and solids without overt s/s of dysphagia or aspiration. Discussed option to complete MBS, which pt states she would prefer to defer unless necessary. Provided education regarding general aspiration precautions with recommendations to continue current diet. No SLP f/u is  clinically indicated at this time, will sign off. SLP Visit Diagnosis: Dysphagia, unspecified (R13.10)    Aspiration Risk  Mild aspiration risk    Diet Recommendation Regular;Thin liquid    Liquid Administration via: Cup;Straw Medication Administration: Whole meds with liquid Supervision: Patient able to self feed Compensations: Slow rate;Small sips/bites Postural Changes: Seated upright at 90 degrees;Remain upright for at least 30 minutes after po intake    Other  Recommendations Oral Care Recommendations: Oral care BID    Recommendations for follow up therapy are one component of a multi-disciplinary discharge planning process, led by the attending physician.  Recommendations may be updated based on patient status, additional functional criteria and insurance authorization.  Follow up Recommendations No SLP follow up      Assistance Recommended at Discharge    Functional Status Assessment Patient has not had a recent decline in their functional status  Frequency and Duration            Prognosis Prognosis for improved oropharyngeal function: Good      Swallow Study   General HPI: 89 y.o. female presented to the ED with worsening SOB, abdominal distention and leg swelling. Dx ARF with hypoxia, severe tricuspid valve regurgitation, lactic acidosis with concern for pna. PMHx hypertension, persistent A-fib, hypothyroidism, GERD, chronic kidney dz stage IIIb Type of Study: Bedside Swallow Evaluation Previous Swallow Assessment: none in chart Diet Prior to this Study: Regular;Thin liquids (Level 0) Temperature Spikes Noted: No Respiratory Status: Nasal cannula History of Recent Intubation: No Behavior/Cognition: Alert;Cooperative;Pleasant mood Oral Cavity Assessment: Within Functional Limits Oral Care Completed by  SLP: No Oral Cavity - Dentition: Adequate natural dentition Vision: Functional for self-feeding Self-Feeding Abilities: Able to feed self Patient Positioning:  Upright in bed Baseline Vocal Quality: Normal Volitional Cough: Strong Volitional Swallow: Able to elicit    Oral/Motor/Sensory Function Overall Oral Motor/Sensory Function: Within functional limits   Ice Chips Ice chips: Not tested   Thin Liquid Thin Liquid: Within functional limits Presentation: Straw;Self Fed    Nectar Thick Nectar Thick Liquid: Not tested   Honey Thick Honey Thick Liquid: Not tested   Puree Puree: Within functional limits Presentation: Spoon;Self Fed   Solid     Solid: Within functional limits Presentation: Self Fed      Amil Kale, M.A., CCC-SLP Speech Language Pathology, Acute Rehabilitation Services  Secure Chat preferred [phone]  05/08/2023,3:00 PM

## 2023-05-08 NOTE — Progress Notes (Signed)
 Pharmacy Antibiotic Note  Kathleen Santana is a 89 y.o. female admitted on 05/06/2023 with elevated lactic acid and SOB.  Pharmacy has been consulted to transition antibiotics to Unasyn .  Given the patient's age, petite size, and renal function - will give lower Unasyn  dosing.   Plan: - Start Unasyn  1.5g IV every 12 hours - Will continue to follow renal function, culture results, LOT, and antibiotic de-escalation plans   Height: 5\' 2"  (157.5 cm) Weight: 55.5 kg (122 lb 4.8 oz) IBW/kg (Calculated) : 50.1  Temp (24hrs), Avg:97.8 F (36.6 C), Min:97.5 F (36.4 C), Max:98.3 F (36.8 C)  Recent Labs  Lab 05/02/23 1426 05/06/23 1153 05/06/23 1205 05/06/23 1500 05/07/23 0553 05/07/23 1201 05/08/23 0605 05/08/23 0802 05/08/23 0817  WBC 7.7 9.8  --   --  11.8*  --  24.1*  --  24.7*  CREATININE 1.32* 1.40* 1.50*  --  1.70*  --  1.54*  --   --   LATICACIDVEN  --   --  4.4* 4.2*  --  4.1*  --  2.7*  --     Estimated Creatinine Clearance: 17.7 mL/min (A) (by C-G formula based on SCr of 1.54 mg/dL (H)).    Allergies  Allergen Reactions   Nickel Rash   Norco [Hydrocodone-Acetaminophen ] Nausea And Vomiting and Other (See Comments)    Passed out, dizziness   Other Other (See Comments) and Cough    Seasonal allergies- congestion and itchy eyes, also    Thank you for allowing pharmacy to be a part of this patient's care.  Garland Junk, PharmD, BCPS, BCIDP Infectious Diseases Clinical Pharmacist 05/08/2023 3:30 PM   **Pharmacist phone directory can now be found on [email or web address] ([email or web address]).  Listed under [facility].

## 2023-05-08 NOTE — Plan of Care (Signed)

## 2023-05-08 NOTE — Plan of Care (Signed)

## 2023-05-08 NOTE — Consult Note (Signed)
 [facility]  Total days of antibiotics 3               Reason for Consult: leukocytosis and lactic acidosis   Referring Physician: Drexel Gentles  Principal Problem:   Acute respiratory failure (HCC) Active Problems:   Acute diastolic heart failure (HCC)   Pneumonia due to infectious organism   Severe tricuspid valve regurgitation   Bronchitis   Lactic acidosis   Paroxysmal atrial fibrillation (HCC)   Chronic anticoagulation   Essential hypertension   Hypothyroidism   Chronic kidney disease, stage III (moderate) (HCC)    HPI: Kathleen Santana is a 89 y.o. female  with hx of HTN, pAfib, CKD 3, who recently underwent cardioversion on 3/25, however in the last 3-4 wk she has been having increasing shortness of breath , occasionally wheezing for which she has received 2 courses of abtx (doxy followed by azithromycin) for bronchitis. She presented to the ED on 4/22 due to worsening shortness of breath and some lower extremity swelling. She was found to be hypoxic at 90% on room air on arrival, given albuterol  and placed on cpap to help/ she also received  125mg  solumedrol. Cxr showed possible pulm edema vs. Pulmonary infiltrates and small bilateral effusion. Since lactic acid elevated she was started on linezolid  and cefepime . Patient feels better, especially after receiving albuterol  treatment. She denies any abdominal pain. Some productive cough, but feels more like saliva. Her cbc on admit was 11 increased to 24K after receiving dose of steroids. LA on admit was 4.4 but only trended down to 2.9/ procalcitonin negative x 2  Past Medical History:  Diagnosis Date   Afib (HCC)    Arrhythmia    COVID-19    2022   GERD (gastroesophageal reflux disease)    Hypertension    Hypothyroidism    Lower extremity neuropathy    Osteoporosis    Pneumonia    2021   Rhinitis, non-allergic     Allergies:  Allergies  Allergen Reactions   Nickel Rash   Norco  [Hydrocodone-Acetaminophen ] Nausea And Vomiting and Other (See Comments)    Passed out, dizziness   Other Other (See Comments) and Cough    Seasonal allergies- congestion and itchy eyes, also    Current antibiotics:   MEDICATIONS:  albuterol   2.5 mg Nebulization BID   amiodarone   200 mg Oral BID   apixaban   2.5 mg Oral BID   Chlorhexidine  Gluconate Cloth  6 each Topical Daily   diltiazem   240 mg Oral Daily   gabapentin   300 mg Oral BID   guaiFENesin   600 mg Oral BID   levothyroxine   50 mcg Oral QAC breakfast   magnesium  gluconate  500 mg Oral Daily   sodium chloride  flush  10-40 mL Intracatheter Q12H   sodium chloride  flush  10-40 mL Intracatheter Q12H   sodium chloride  flush  3 mL Intravenous Q12H    Social History   Tobacco Use   Smoking status: Former    Types: Cigarettes   Smokeless tobacco: Never  Substance Use Topics   Alcohol  use: Never   Drug use: Never    Family History  Problem Relation Age of Onset   Arrhythmia Mother    Heart disease Father     Review of Systems -  +shortness of breath. 12 point ros is negative  OBJECTIVE: Temp:  [97.4 F (36.3 C)-98.3 F (36.8 C)] 97.4 F (36.3 C) (04/24 1236) Pulse Rate:  [63-87] 63 (04/24  1236) Resp:  [15-18] 15 (04/24 1236) BP: (107-122)/(63-82) 113/63 (04/24 1236) SpO2:  [94 %-97 %] 97 % (04/24 1124) Weight:  [55.5 kg] 55.5 kg (04/24 0505) Physical Exam  Constitutional:  oriented to person, place, and time. appears well-developed and well-nourished. No distress.  HENT: [place]/AT, PERRLA, no scleral icterus Mouth/Throat: Oropharynx is clear and moist. No oropharyngeal exudate.  Cardiovascular: irreg irreg +sEM Pulmonary/Chest: Effort normal and breath sounds normal. No respiratory distress.  Expiratory wheeze Neck = supple, no nuchal rigidity Abdominal: Soft. Bowel sounds are normal.  exhibits no distension. There is no tenderness.  Lymphadenopathy: no cervical adenopathy. No axillary adenopathy Neurological:  alert and oriented to person, place, and time.  Skin: Skin is warm and dry. No rash noted. No erythema.  Psychiatric: a normal mood and affect.  behavior is normal.    LABS: Results for orders placed or performed during the hospital encounter of 05/06/23 (from the past 48 hours)  Resp panel by RT-PCR (RSV, Flu A&B, Covid) Anterior Nasal Swab     Status: None   Collection Time: 05/06/23  5:18 PM   Specimen: Anterior Nasal Swab  Result Value Ref Range   SARS Coronavirus 2 by RT PCR NEGATIVE NEGATIVE   Influenza A by PCR NEGATIVE NEGATIVE   Influenza B by PCR NEGATIVE NEGATIVE    Comment: (NOTE) The Xpert Xpress SARS-CoV-2/FLU/RSV plus assay is intended as an aid in the diagnosis of influenza from Nasopharyngeal swab specimens and should not be used as a sole basis for treatment. Nasal washings and aspirates are unacceptable for Xpert Xpress SARS-CoV-2/FLU/RSV testing.  Fact Sheet for Patients: BloggerCourse.com  Fact Sheet for Healthcare Providers: SeriousBroker.it  This test is not yet approved or cleared by the [place]  FDA and has been authorized for detection and/or diagnosis of SARS-CoV-2 by FDA under an Emergency Use Authorization (EUA). This EUA will remain in effect (meaning this test can be used) for the duration of the COVID-19 declaration under Section 564(b)(1) of the Act, 21 U.S.C. section 360bbb-3(b)(1), unless the authorization is terminated or revoked.     Resp Syncytial Virus by PCR NEGATIVE NEGATIVE    Comment: (NOTE) Fact Sheet for Patients: BloggerCourse.com  Fact Sheet for Healthcare Providers: SeriousBroker.it  This test is not yet approved or cleared by the [place]  FDA and has been authorized for detection and/or diagnosis of SARS-CoV-2 by FDA under an Emergency Use Authorization (EUA). This EUA will remain in effect (meaning this test can be  used) for the duration of the COVID-19 declaration under Section 564(b)(1) of the Act, 21 U.S.C. section 360bbb-3(b)(1), unless the authorization is terminated or revoked.  Performed at [facility], [address] [place], [place] [address]   CBC     Status: Abnormal   Collection Time: 05/07/23  5:53 AM  Result Value Ref Range   WBC 11.8 (H) 4.0 - 10.5 K/uL   RBC 3.54 (L) 3.87 - 5.11 MIL/uL   Hemoglobin 11.0 (L) 12.0 - 15.0 g/dL   HCT 60.4 (L) 54.0 - 98.1 %   MCV 92.4 80.0 - 100.0 fL   MCH 31.1 26.0 - 34.0 pg   MCHC 33.6 30.0 - 36.0 g/dL   RDW 19.1 47.8 - 29.5 %   Platelets 230 150 - 400 K/uL   nRBC 0.0 0.0 - 0.2 %    Comment: Performed at [facility], [address], [place], [place] [address]  Basic metabolic panel     Status: Abnormal   Collection  Time: 05/07/23  5:53 AM  Result Value Ref Range   Sodium 130 (L) 135 - 145 mmol/L   Potassium 4.4 3.5 - 5.1 mmol/L   Chloride 98 98 - 111 mmol/L   CO2 20 (L) 22 - 32 mmol/L   Glucose, Bld 176 (H) 70 - 99 mg/dL    Comment: Glucose reference range applies only to samples taken after fasting for at least 8 hours.   BUN 26 (H) 8 - 23 mg/dL   Creatinine, Ser 1.61 (H) 0.44 - 1.00 mg/dL   Calcium 8.7 (L) 8.9 - 10.3 mg/dL   GFR, Estimated 28 (L) >60 mL/min    Comment: (NOTE) Calculated using the CKD-EPI Creatinine Equation (2021)    Anion gap 12 5 - 15    Comment: Performed at [facility], [address], [place], [place] [address]  Magnesium      Status: None   Collection Time: 05/07/23  5:53 AM  Result Value Ref Range   Magnesium  2.3 1.7 - 2.4 mg/dL    Comment: Performed at [facility], [address] [place], [place] [address]  I-Stat CG4 Lactic Acid     Status: Abnormal   Collection Time: 05/07/23 12:01 PM  Result Value Ref Range   Lactic Acid, Venous 4.1 (HH) 0.5 - 1.9 mmol/L   Comment NOTIFIED PHYSICIAN   Hepatic function panel     Status: None   Collection Time: 05/07/23  1:58 PM  Result  Value Ref Range   Total Protein 7.1 6.5 - 8.1 g/dL   Albumin 3.9 3.5 - 5.0 g/dL   AST 35 15 - 41 U/L   ALT 29 0 - 44 U/L   Alkaline Phosphatase 63 38 - 126 U/L   Total Bilirubin 0.4 0.0 - 1.2 mg/dL   Bilirubin, Direct 0.1 0.0 - 0.2 mg/dL   Indirect Bilirubin 0.3 0.3 - 0.9 mg/dL    Comment: Performed at [facility], [address] [place], [place] [address]  Culture, blood (Routine X 2) w Reflex to ID Panel     Status: None (Preliminary result)   Collection Time: 05/07/23  1:58 PM   Specimen: BLOOD LEFT ARM  Result Value Ref Range   Specimen Description BLOOD LEFT ARM    Special Requests      BOTTLES DRAWN AEROBIC AND ANAEROBIC Blood Culture adequate volume   Culture      NO GROWTH < 24 HOURS Performed at [facility], [address] [place], [place] [address]    Report Status PENDING   Culture, blood (Routine X 2) w Reflex to ID Panel     Status: None (Preliminary result)   Collection Time: 05/07/23  1:58 PM   Specimen: BLOOD LEFT ARM  Result Value Ref Range   Specimen Description BLOOD LEFT ARM    Special Requests      BOTTLES DRAWN AEROBIC ONLY Blood Culture adequate volume   Culture      NO GROWTH < 24 HOURS Performed at [facility], [address] [place], [place] [address]    Report Status PENDING   Urine Culture (for pregnant, neutropenic or urologic patients or patients with an indwelling urinary catheter)     Status: None (Preliminary result)   Collection Time: 05/07/23  2:21 PM   Specimen: Urine, Clean Catch  Result Value Ref Range   Specimen Description URINE, CLEAN CATCH    Special Requests NONE    Culture  NO GROWTH < 24 HOURS Performed at [facility], [address] [place], [place] [address]    Report Status PENDING   Urinalysis, Routine w reflex microscopic -Urine, Clean Catch     Status: Abnormal   Collection Time: 05/07/23  2:44 PM  Result Value Ref Range   Color, Urine STRAW (A) YELLOW   APPearance CLEAR CLEAR    Specific Gravity, Urine 1.006 1.005 - 1.030   pH 5.0 5.0 - 8.0   Glucose, UA NEGATIVE NEGATIVE mg/dL   Hgb urine dipstick NEGATIVE NEGATIVE   Bilirubin Urine NEGATIVE NEGATIVE   Ketones, ur NEGATIVE NEGATIVE mg/dL   Protein, ur NEGATIVE NEGATIVE mg/dL   Nitrite NEGATIVE NEGATIVE   Leukocytes,Ua NEGATIVE NEGATIVE    Comment: Performed at [facility], [address], [place], [place] [address]  Cooxemetry Panel (carboxy, met, total hgb, O2 sat)     Status: Abnormal   Collection Time: 05/07/23  6:39 PM  Result Value Ref Range   Total hemoglobin 11.5 (L) 12.0 - 16.0 g/dL   O2 Saturation 62.9 %   Carboxyhemoglobin 0.8 0.5 - 1.5 %   Methemoglobin <0.7 0.0 - 1.5 %    Comment: Performed at [facility], [address], [place], [place] [address]  Cooxemetry Panel (carboxy, met, total hgb, O2 sat)     Status: Abnormal   Collection Time: 05/08/23  4:50 AM  Result Value Ref Range   Total hemoglobin 11.4 (L) 12.0 - 16.0 g/dL   O2 Saturation 32.4 %   Carboxyhemoglobin 1.3 0.5 - 1.5 %   Methemoglobin <0.7 0.0 - 1.5 %    Comment: Performed at [facility], [address], [place], [place] [address]  Brain natriuretic peptide     Status: Abnormal   Collection Time: 05/08/23  6:05 AM  Result Value Ref Range   B Natriuretic Peptide 285.1 (H) 0.0 - 100.0 pg/mL    Comment: Performed at [facility], [address], [place], [place] [address]  Comprehensive metabolic panel with GFR     Status: Abnormal   Collection Time: 05/08/23  6:05 AM  Result Value Ref Range   Sodium 123 (L) 135 - 145 mmol/L    Comment: DELTA CHECK NOTED   Potassium 5.0 3.5 - 5.1 mmol/L   Chloride 91 (L) 98 - 111 mmol/L   CO2 19 (L) 22 - 32 mmol/L   Glucose, Bld 199 (H) 70 - 99 mg/dL    Comment: Glucose reference range applies only to samples taken after fasting for at least 8 hours.   BUN 33 (H) 8 - 23 mg/dL   Creatinine, Ser 6.44 (H) 0.44 - 1.00 mg/dL   Calcium 8.6 (L) 8.9 - 10.3 mg/dL   Total  Protein 6.2 (L) 6.5 - 8.1 g/dL   Albumin 3.3 (L) 3.5 - 5.0 g/dL   AST 32 15 - 41 U/L   ALT 30 0 - 44 U/L   Alkaline Phosphatase 54 38 - 126 U/L   Total Bilirubin 0.5 0.0 - 1.2 mg/dL   GFR, Estimated 31 (L) >60 mL/min    Comment: (NOTE) Calculated using the CKD-EPI Creatinine Equation (2021)    Anion gap 13 5 - 15    Comment: Performed at [facility], [address], [place], [place] [address]  CBC     Status: Abnormal   Collection Time: 05/08/23  6:05 AM  Result Value Ref Range   WBC 24.1 (H) 4.0 -  10.5 K/uL   RBC 3.60 (L) 3.87 - 5.11 MIL/uL   Hemoglobin 10.9 (L) 12.0 - 15.0 g/dL   HCT 16.1 (L) 09.6 - 04.5 %   MCV 90.0 80.0 - 100.0 fL   MCH 30.3 26.0 - 34.0 pg   MCHC 33.6 30.0 - 36.0 g/dL   RDW 40.9 81.1 - 91.4 %   Platelets 258 150 - 400 K/uL   nRBC 0.0 0.0 - 0.2 %    Comment: Performed at [facility], [address], [place], [place] [address]  Procalcitonin     Status: None   Collection Time: 05/08/23  6:05 AM  Result Value Ref Range   Procalcitonin <0.10 ng/mL    Comment:        Interpretation: PCT (Procalcitonin) <= 0.5 ng/mL: Systemic infection (sepsis) is not likely. Local bacterial infection is possible. (NOTE)       Sepsis PCT Algorithm           Lower Respiratory Tract                                      Infection PCT Algorithm    ----------------------------     ----------------------------         PCT < 0.25 ng/mL                PCT < 0.10 ng/mL          Strongly encourage             Strongly discourage   discontinuation of antibiotics    initiation of antibiotics    ----------------------------     -----------------------------       PCT 0.25 - 0.50 ng/mL            PCT 0.10 - 0.25 ng/mL               OR       >80% decrease in PCT            Discourage initiation of                                            antibiotics      Encourage discontinuation           of antibiotics    ----------------------------      -----------------------------         PCT >= 0.50 ng/mL              PCT 0.26 - 0.50 ng/mL               AND        <80% decrease in PCT             Encourage initiation of                                             antibiotics       Encourage continuation           of antibiotics    ----------------------------     -----------------------------        PCT >= 0.50 ng/mL  PCT > 0.50 ng/mL               AND         increase in PCT                  Strongly encourage                                      initiation of antibiotics    Strongly encourage escalation           of antibiotics                                     -----------------------------                                           PCT <= 0.25 ng/mL                                                 OR                                        > 80% decrease in PCT                                      Discontinue / Do not initiate                                             antibiotics  Performed at [facility], [address] [place], [place] [address]   Lactic acid, plasma     Status: Abnormal   Collection Time: 05/08/23  8:02 AM  Result Value Ref Range   Lactic Acid, Venous 2.7 (HH) 0.5 - 1.9 mmol/L    Comment: CRITICAL RESULT CALLED TO, READ BACK BY AND VERIFIED WITH HUYNA,L RN @ 0920 05/08/23 LEONARD,A Performed at [facility], [address], [place], [place] [address]   CBC with Differential/Platelet     Status: Abnormal   Collection Time: 05/08/23  8:17 AM  Result Value Ref Range   WBC 24.7 (H) 4.0 - 10.5 K/uL   RBC 3.66 (L) 3.87 - 5.11 MIL/uL   Hemoglobin 11.4 (L) 12.0 - 15.0 g/dL   HCT 72.5 (L) 36.6 - 44.0 %   MCV 91.0 80.0 - 100.0 fL   MCH 31.1 26.0 - 34.0 pg   MCHC 34.2 30.0 - 36.0 g/dL   RDW 34.7 42.5 - 95.6 %   Platelets 257 150 - 400 K/uL   nRBC 0.0 0.0 - 0.2 %   Neutrophils Relative % 92 %   Neutro Abs 22.7 (H) 1.7 - 7.7 K/uL   Lymphocytes Relative 3 %   Lymphs Abs 0.7  0.7 - 4.0 K/uL   Monocytes Relative 4 %  Monocytes Absolute 1.0 0.1 - 1.0 K/uL   Eosinophils Relative 0 %   Eosinophils Absolute 0.0 0.0 - 0.5 K/uL   Basophils Relative 0 %   Basophils Absolute 0.0 0.0 - 0.1 K/uL   Immature Granulocytes 1 %   Abs Immature Granulocytes 0.20 (H) 0.00 - 0.07 K/uL    Comment: Performed at [facility], [address] [place], [place] [address]  Respiratory (~20 pathogens) panel by PCR     Status: None   Collection Time: 05/08/23 11:00 AM   Specimen: Nasopharyngeal Swab; Respiratory  Result Value Ref Range   Adenovirus NOT DETECTED NOT DETECTED   Coronavirus 229E NOT DETECTED NOT DETECTED    Comment: (NOTE) The Coronavirus on the Respiratory Panel, DOES NOT test for the novel  Coronavirus (2019 nCoV)    Coronavirus HKU1 NOT DETECTED NOT DETECTED   Coronavirus NL63 NOT DETECTED NOT DETECTED   Coronavirus OC43 NOT DETECTED NOT DETECTED   Metapneumovirus NOT DETECTED NOT DETECTED   Rhinovirus / Enterovirus NOT DETECTED NOT DETECTED   Influenza A NOT DETECTED NOT DETECTED   Influenza B NOT DETECTED NOT DETECTED   Parainfluenza Virus 1 NOT DETECTED NOT DETECTED   Parainfluenza Virus 2 NOT DETECTED NOT DETECTED   Parainfluenza Virus 3 NOT DETECTED NOT DETECTED   Parainfluenza Virus 4 NOT DETECTED NOT DETECTED   Respiratory Syncytial Virus NOT DETECTED NOT DETECTED   Bordetella pertussis NOT DETECTED NOT DETECTED   Bordetella Parapertussis NOT DETECTED NOT DETECTED   Chlamydophila pneumoniae NOT DETECTED NOT DETECTED   Mycoplasma pneumoniae NOT DETECTED NOT DETECTED    Comment: Performed at [facility], [address], [place], [place] [address]  Lactic acid, plasma     Status: Abnormal   Collection Time: 05/08/23  3:20 PM  Result Value Ref Range   Lactic Acid, Venous 2.9 (HH) 0.5 - 1.9 mmol/L    Comment: CRITICAL VALUE NOTED. VALUE IS CONSISTENT WITH PREVIOUSLY REPORTED/CALLED VALUE Performed at [facility], [address],  [place], [place] [address]     MICRO: -------------- IMAGING: DG CHEST PORT 1 VIEW Result Date: 05/07/2023 CLINICAL DATA:  Status post PICC line EXAM: PORTABLE CHEST 1 VIEW COMPARISON:  May 06, 2023 FINDINGS: Persistent bilateral basilar infiltrates and atelectasis small bilateral pleural effusions and reactions Heart normal size No significant congestive changes Right PICC line tip in the SVC cavoatrial junction. IMPRESSION: Right PICC line tip in the SVC cavoatrial junction. Electronically Signed   By: Fredrich Jefferson M.D.   On: 05/07/2023 18:11   US  EKG SITE RITE Result Date: 05/07/2023 If Site Rite image not attached, placement could not be confirmed due to current cardiac rhythm.  CT CHEST WO CONTRAST Result Date: 05/06/2023 CLINICAL DATA:  Respiratory illness EXAM: CT CHEST WITHOUT CONTRAST TECHNIQUE: Multidetector CT imaging of the chest was performed following the standard protocol without IV contrast. RADIATION DOSE REDUCTION: This exam was performed according to the departmental dose-optimization program which includes automated exposure control, adjustment of the mA and/or kV according to patient size and/or use of iterative reconstruction technique. COMPARISON:  05/06/2023, CT 01/10/2022 FINDINGS: Cardiovascular: Limited evaluation without intravenous contrast. Moderate aortic atherosclerosis. No aneurysm. Coronary vascular calcification. Mild cardiomegaly. No sizable pericardial effusion Mediastinum/Nodes: Patent trachea. No thyroid mass. No suspicious lymph nodes. Esophagus within normal limits Lungs/Pleura: Small left and small moderate right pleural effusions. Partial lower lobe consolidations favored to represent passive atelectasis. Scarring or atelectasis at the right middle lobe. No pneumothorax Upper Abdomen: No  acute finding Musculoskeletal: No acute osseous abnormality. Multilevel degenerative changes. IMPRESSION: 1. Cardiomegaly with small left and small moderate pleural effusions and  probable passive atelectasis at the lower lobes. Aortic Atherosclerosis (ICD10-I70.0). Electronically Signed   By: Esmeralda Hedge M.D.   On: 05/06/2023 18:15    HISTORICAL MICRO/IMAGING  Assessment/Plan:  89yo F with respiratory wheezing, possibly exacerbated from viral illness. Recently treated with 2 course of abtx. Currently has leukamoid reaction from steroids rather than infection, I suspect. Since procalcitonin is negative- reassuring -- sensitivity about 65-85% for pneumonia ,but elderly. Chest CT RML infiltrate possible vs atelectesis - will check RVP, MRSA nasal screen - will stop linezolid  no hx of MRSA pna or colonization or recent hospitalization - will switch cefepime  to unasyn  for the time being nad consider to stop tomorrow as she returns to baseline.  Hyponatremia = repeat tomorrow avoid aggressive correction  Wheezing = continue with albuterol  treatments  Payden Docter B. Levern Reader MD MPH [facility] [phone]

## 2023-05-08 NOTE — Progress Notes (Signed)
 Rounding Note    Patient Name: Kathleen Santana Date of Encounter: 05/08/2023  [facility] Cardiologist: Oneil Bigness, MD   Subjective   Pt found sleeping with HOH at 45 degrees. She states breathing is baseline, continues with cough  Inpatient Medications    Scheduled Meds:  albuterol   2.5 mg Nebulization BID   amiodarone   200 mg Oral BID   apixaban   2.5 mg Oral BID   Chlorhexidine  Gluconate Cloth  6 each Topical Daily   diltiazem   240 mg Oral Daily   gabapentin   300 mg Oral BID   guaiFENesin   600 mg Oral BID   levothyroxine   50 mcg Oral QAC breakfast   magnesium  gluconate  500 mg Oral Daily   sodium chloride  flush  10-40 mL Intracatheter Q12H   sodium chloride  flush  10-40 mL Intracatheter Q12H   sodium chloride  flush  3 mL Intravenous Q12H   Continuous Infusions:  ceFEPime  (MAXIPIME ) IV 2 g (05/07/23 1842)   linezolid  (ZYVOX ) IV 600 mg (05/08/23 0501)   PRN Meds: acetaminophen  **OR** acetaminophen , albuterol , polyvinyl alcohol , sodium chloride  flush, sodium chloride  flush   Vital Signs    Vitals:   05/07/23 2322 05/08/23 0357 05/08/23 0505 05/08/23 0800  BP: 121/82 122/71  107/74  Pulse: 74   73  Resp: 18 17  16   Temp: 97.6 F (36.4 C) 97.7 F (36.5 C)  97.6 F (36.4 C)  TempSrc: Oral Oral  Oral  SpO2: 95% 94%  95%  Weight:   55.5 kg   Height:        Intake/Output Summary (Last 24 hours) at 05/08/2023 0857 Last data filed at 05/08/2023 0850 Gross per 24 hour  Intake 1660 ml  Output 1300 ml  Net 360 ml      05/08/2023    5:05 AM 05/07/2023   12:38 PM 05/06/2023   11:49 AM  Last 3 Weights  Weight (lbs) 122 lb 4.8 oz 123 lb 3.8 oz 117 lb  Weight (kg) 55.475 kg 55.9 kg 53.071 kg      Telemetry    Atiral fibrillation with bradycardic rate overnight in the 40s - Personally Reviewed  ECG    No new tracings - Personally Reviewed  Physical Exam   GEN: No acute distress.   Neck: No JVD Cardiac: irregular rhythm, regular rate   Respiratory: rhonchi throughout GI: Soft, nontender, non-distended  MS: No edema; No deformity. Neuro:  Nonfocal  Psych: Normal affect   Labs    High Sensitivity Troponin:   Recent Labs  Lab 05/06/23 1153 05/06/23 1455  TROPONINIHS 8 6     Chemistry Recent Labs  Lab 05/06/23 1153 05/06/23 1205 05/07/23 0553 05/07/23 1358 05/08/23 0605  NA 134* 134*  134* 130*  --  123*  K 4.5 4.5  4.5 4.4  --  5.0  CL 100 101 98  --  91*  CO2 21*  --  20*  --  19*  GLUCOSE 147* 148* 176*  --  199*  BUN 24* 27* 26*  --  33*  CREATININE 1.40* 1.50* 1.70*  --  1.54*  CALCIUM 9.0  --  8.7*  --  8.6*  MG  --   --  2.3  --   --   PROT 6.5  --   --  7.1 6.2*  ALBUMIN 3.8  --   --  3.9 3.3*  AST 37  --   --  35 32  ALT 30  --   --  29 30  ALKPHOS 64  --   --  63 54  BILITOT 0.5  --   --  0.4 0.5  GFRNONAA 35*  --  28*  --  31*  ANIONGAP 13  --  12  --  13    Lipids No results for input(s): "CHOL", "TRIG", "HDL", "LABVLDL", "LDLCALC", "CHOLHDL" in the last 168 hours.  Hematology Recent Labs  Lab 05/07/23 0553 05/08/23 0605 05/08/23 0817  WBC 11.8* 24.1* 24.7*  RBC 3.54* 3.60* 3.66*  HGB 11.0* 10.9* 11.4*  HCT 32.7* 32.4* 33.3*  MCV 92.4 90.0 91.0  MCH 31.1 30.3 31.1  MCHC 33.6 33.6 34.2  RDW 15.4 15.3 15.3  PLT 230 258 257   Thyroid No results for input(s): "TSH", "FREET4" in the last 168 hours.  BNP Recent Labs  Lab 05/06/23 1153 05/08/23 0605  BNP 227.8* 285.1*    DDimer No results for input(s): "DDIMER" in the last 168 hours.   Radiology    DG CHEST PORT 1 VIEW Result Date: 05/07/2023 CLINICAL DATA:  Status post PICC line EXAM: PORTABLE CHEST 1 VIEW COMPARISON:  May 06, 2023 FINDINGS: Persistent bilateral basilar infiltrates and atelectasis small bilateral pleural effusions and reactions Heart normal size No significant congestive changes Right PICC line tip in the SVC cavoatrial junction. IMPRESSION: Right PICC line tip in the SVC cavoatrial junction.  Electronically Signed   By: Fredrich Jefferson M.D.   On: 05/07/2023 18:11   US  EKG SITE RITE Result Date: 05/07/2023 If Site Rite image not attached, placement could not be confirmed due to current cardiac rhythm.  CT CHEST WO CONTRAST Result Date: 05/06/2023 CLINICAL DATA:  Respiratory illness EXAM: CT CHEST WITHOUT CONTRAST TECHNIQUE: Multidetector CT imaging of the chest was performed following the standard protocol without IV contrast. RADIATION DOSE REDUCTION: This exam was performed according to the departmental dose-optimization program which includes automated exposure control, adjustment of the mA and/or kV according to patient size and/or use of iterative reconstruction technique. COMPARISON:  05/06/2023, CT 01/10/2022 FINDINGS: Cardiovascular: Limited evaluation without intravenous contrast. Moderate aortic atherosclerosis. No aneurysm. Coronary vascular calcification. Mild cardiomegaly. No sizable pericardial effusion Mediastinum/Nodes: Patent trachea. No thyroid mass. No suspicious lymph nodes. Esophagus within normal limits Lungs/Pleura: Small left and small moderate right pleural effusions. Partial lower lobe consolidations favored to represent passive atelectasis. Scarring or atelectasis at the right middle lobe. No pneumothorax Upper Abdomen: No acute finding Musculoskeletal: No acute osseous abnormality. Multilevel degenerative changes. IMPRESSION: 1. Cardiomegaly with small left and small moderate pleural effusions and probable passive atelectasis at the lower lobes. Aortic Atherosclerosis (ICD10-I70.0). Electronically Signed   By: Esmeralda Hedge M.D.   On: 05/06/2023 18:15   DG Chest Port 1 View Result Date: 05/06/2023 CLINICAL DATA:  Shortness of breath EXAM: PORTABLE CHEST 1 VIEW COMPARISON:  CT chest January 10, 2022 FINDINGS: Bilateral basilar pulmonary infiltrates and atelectasis with a small bilateral pleural effusions could correlate with pneumonia. Heart and mediastinum normal  IMPRESSION: Bilateral basilar pulmonary infiltrates and atelectasis with a small bilateral pleural effusions could correlate with pneumonia. Electronically Signed   By: Fredrich Jefferson M.D.   On: 05/06/2023 13:58    Cardiac Studies   Echo 04/29/23  1. Left ventricular ejection fraction, by estimation, is 50 to 55%. The  left ventricle has low normal function. The left ventricle demonstrates  global hypokinesis. There is mild concentric left ventricular hypertrophy.  Left ventricular diastolic  function could not be evaluated.   2. Right ventricular systolic function  is normal. The right ventricular  size is normal. There is normal pulmonary artery systolic pressure. The  estimated right ventricular systolic pressure is 24.3 mmHg.   3. Left atrial size was mildly dilated.   4. Right atrial size was severely dilated.   5. The mitral valve is normal in structure. Moderate mitral valve  regurgitation. No evidence of mitral stenosis.   6. Tricuspid valve regurgitation is moderate to severe.   7. The aortic valve is tricuspid. Aortic valve regurgitation is not  visualized. Aortic valve sclerosis/calcification is present, without any  evidence of aortic stenosis.   8. The inferior vena cava is normal in size with greater than 50%  respiratory variability, suggesting right atrial pressure of 3 mmHg.   Patient Profile     89 y.o. female with a hx of Persistent atrial fibrillation, hypertension, hyperlipidemia, bronchitis, who is being seen for the evaluation of congestive heart failure   Assessment & Plan    Acute respiratory failure Acute on chronic diastolic heart failure Pneumonia  Severe TR - BNP mildly elevated - received IV lasix  with creatinine bump from 1.5 to 1.7 - coox with O2 64.5 - blood cultures NG in 24 hrs - leukocytosis with WBC 11.8 --> 24.1 - procalctonin pending, was negative 2 days ago - on cefepime  and linezolid  - hold further diuresis - CXR with infiltrates -  higher suspicion for primary pulmonary etiology driving respiratory illness rather than CHF   Persistent atrial fibrillation - now on amiodarone  and cardizem  - BB discontinued due to Raynaud's syndrome - pt delayed starting amiodarone , now on PO load with 200 mg BID - bradycardic overnight in the 40s - consider reducing cardizem  to 120s - has already received 240 mg today - continue 2.5 mg eliquis  BID     For questions or updates, please contact [facility] Please consult [email or web address] for contact info under        Signed, Lamond Pilot, PA  05/08/2023, 8:57 AM

## 2023-05-08 NOTE — TOC Initial Note (Signed)
 Transition of Care [facility]) - Initial/Assessment Note    Patient Details  Name: Kathleen Santana MRN: [id number] Date of Birth: [birth year]  [facility] CM/SW Contact:    [name], RN Phone Number: 05/08/2023, 3:51 PM  Clinical Narrative: Patient presented for shortness of breath. PTA patient states she was independent from home alone. Daughter in the room during the visit and she assists with transportation to appointments. Patient states she still drives short distances. Patient has DME RW in the home; however, does not have to use it. Patient states she gets her medications and takes them appropriately. No home needs identified during this visit. Case Manager will continue to follow for additional transition of care needs.               Expected Discharge Plan: Home/Self Care Barriers to Discharge: Continued Medical Work up   Patient Goals and CMS Choice Patient states their goals for this hospitalization and ongoing recovery are:: Plan to return home once stable.  Expected Discharge Plan and Services In-house Referral: NA Discharge Planning Services: CM Consult Post Acute Care Choice: NA Living arrangements for the past 2 months: Single Family Home                   DME Agency: NA  Prior Living Arrangements/Services Living arrangements for the past 2 months: Single Family Home Lives with:: Self Patient language and need for interpreter reviewed:: Yes Do you feel safe going back to the place where you live?: Yes      Need for Family Participation in Patient Care: Yes (Comment) Care giver support system in place?: Yes (comment) Current home services: DME (has rolling walker; however does not use.) Criminal Activity/Legal Involvement Pertinent to Current Situation/Hospitalization: No - Comment as needed  Activities of Daily Living   ADL Screening (condition at time of admission) Independently performs ADLs?: Yes (appropriate for developmental  age) Is the patient deaf or have difficulty hearing?: Yes Does the patient have difficulty seeing, even when wearing glasses/contacts?: No Does the patient have difficulty concentrating, remembering, or making decisions?: No  Permission Sought/Granted Permission sought to share information with : Family Supports, Case Manager   Emotional Assessment Appearance:: Appears stated age Attitude/Demeanor/Rapport: Engaged Affect (typically observed): Appropriate Orientation: : Oriented to Self, Oriented to Place, Oriented to  Time, Oriented to Situation Alcohol  / Substance Use: Not Applicable Psych Involvement: No (comment)  Admission diagnosis:  Acute respiratory failure (HCC) [J96.00] Dyspnea, unspecified type [R06.00] Patient Active Problem List   Diagnosis Date Noted   Acute respiratory failure (HCC) 05/06/2023   Acute diastolic heart failure (HCC) 05/06/2023   Pneumonia due to infectious organism 05/06/2023   Severe tricuspid valve regurgitation 05/06/2023   Bronchitis 05/06/2023   Lactic acidosis 05/06/2023   Paroxysmal atrial fibrillation (HCC) 05/06/2023   Chronic anticoagulation 05/06/2023   Essential hypertension 05/06/2023   Chronic kidney disease, stage III (moderate) (HCC) 05/06/2023   Hypothyroidism    Pelvic fracture (HCC) 09/05/2019   Pubic ramus fracture (HCC) 09/04/2019   Osteoporosis 09/04/2019   PCP:  Vladimir Groves, PA-C Pharmacy:   [facility] [id number] - [place], [place] - [address] [place] [address] Phone: [phone] Fax: [phone]  [facility] [address] [place] [address] Phone: [phone] Fax: [phone]  Social Drivers of Health (SDOH) Social History: SDOH Screenings   Food Insecurity: No Food Insecurity (05/07/2023)  Housing: Low Risk  (05/07/2023)  Transportation  Needs: No Transportation Needs (05/07/2023)  Utilities: Not At Risk (05/07/2023)  Financial Resource  Strain: Low Risk  (01/21/2023)   [facility]  Physical Activity: Sufficiently Active (01/21/2023)   Received from [facility]  Social Connections: Moderately Integrated (05/07/2023)  Stress: No Stress Concern Present (01/21/2023)   Received from [facility]  Tobacco Use: Medium Risk (05/02/2023)   Readmission Risk Interventions     No data to display

## 2023-05-09 ENCOUNTER — Inpatient Hospital Stay (HOSPITAL_COMMUNITY)

## 2023-05-09 DIAGNOSIS — J9601 Acute respiratory failure with hypoxia: Secondary | ICD-10-CM | POA: Diagnosis not present

## 2023-05-09 DIAGNOSIS — I5031 Acute diastolic (congestive) heart failure: Secondary | ICD-10-CM | POA: Diagnosis not present

## 2023-05-09 DIAGNOSIS — E872 Acidosis, unspecified: Secondary | ICD-10-CM | POA: Diagnosis not present

## 2023-05-09 DIAGNOSIS — J40 Bronchitis, not specified as acute or chronic: Secondary | ICD-10-CM | POA: Diagnosis not present

## 2023-05-09 DIAGNOSIS — J189 Pneumonia, unspecified organism: Secondary | ICD-10-CM | POA: Diagnosis not present

## 2023-05-09 DIAGNOSIS — I480 Paroxysmal atrial fibrillation: Secondary | ICD-10-CM | POA: Diagnosis not present

## 2023-05-09 LAB — COMPREHENSIVE METABOLIC PANEL WITH GFR
ALT: 30 U/L (ref 0–44)
AST: 28 U/L (ref 15–41)
Albumin: 3.2 g/dL — ABNORMAL LOW (ref 3.5–5.0)
Alkaline Phosphatase: 57 U/L (ref 38–126)
Anion gap: 8 (ref 5–15)
BUN: 33 mg/dL — ABNORMAL HIGH (ref 8–23)
CO2: 23 mmol/L (ref 22–32)
Calcium: 8.5 mg/dL — ABNORMAL LOW (ref 8.9–10.3)
Chloride: 99 mmol/L (ref 98–111)
Creatinine, Ser: 1.46 mg/dL — ABNORMAL HIGH (ref 0.44–1.00)
GFR, Estimated: 33 mL/min — ABNORMAL LOW (ref >60)
Glucose, Bld: 90 mg/dL (ref 70–99)
Potassium: 4.6 mmol/L (ref 3.5–5.1)
Sodium: 130 mmol/L — ABNORMAL LOW (ref 135–145)
Total Bilirubin: 0.5 mg/dL (ref 0.0–1.2)
Total Protein: 5.8 g/dL — ABNORMAL LOW (ref 6.5–8.1)

## 2023-05-09 LAB — CBC
HCT: 31.6 % — ABNORMAL LOW (ref 36.0–46.0)
Hemoglobin: 10.6 g/dL — ABNORMAL LOW (ref 12.0–15.0)
MCH: 30.2 pg (ref 26.0–34.0)
MCHC: 33.5 g/dL (ref 30.0–36.0)
MCV: 90 fL (ref 80.0–100.0)
Platelets: 246 10*3/uL (ref 150–400)
RBC: 3.51 MIL/uL — ABNORMAL LOW (ref 3.87–5.11)
RDW: 15.3 % (ref 11.5–15.5)
WBC: 18.1 10*3/uL — ABNORMAL HIGH (ref 4.0–10.5)
nRBC: 0 % (ref 0.0–0.2)

## 2023-05-09 LAB — COOXEMETRY PANEL
Carboxyhemoglobin: 1.3 % (ref 0.5–1.5)
Methemoglobin: <0.7 % (ref 0.0–1.5)
O2 Saturation: 76.2 %
Total hemoglobin: 11.4 g/dL — ABNORMAL LOW (ref 12.0–16.0)

## 2023-05-09 LAB — LACTIC ACID, PLASMA: Lactic Acid, Venous: 1.9 mmol/L (ref 0.5–1.9)

## 2023-05-09 MED ORDER — FUROSEMIDE 40 MG PO TABS
40.0000 mg | ORAL_TABLET | Freq: Every day | ORAL | Status: DC
  Administered 2023-05-09: 40 mg via ORAL
  Filled 2023-05-09: qty 1

## 2023-05-09 MED ORDER — ONDANSETRON HCL 4 MG/2ML IJ SOLN
4.0000 mg | Freq: Four times a day (QID) | INTRAMUSCULAR | Status: DC | PRN
  Filled 2023-05-09: qty 2

## 2023-05-09 MED ORDER — CEFADROXIL 500 MG PO CAPS
1000.0000 mg | ORAL_CAPSULE | Freq: Two times a day (BID) | ORAL | Status: AC
  Administered 2023-05-09 – 2023-05-10 (×3): 1000 mg via ORAL
  Filled 2023-05-09 (×4): qty 2

## 2023-05-09 NOTE — Plan of Care (Signed)
  Problem: Education: Goal: Knowledge of General Education information will improve Description: Including pain rating scale, medication(s)/side effects and non-pharmacologic comfort measures 05/09/2023 0520 by Bolling Bushy, RN Outcome: Progressing 05/08/2023 2111 by Bolling Bushy, RN Outcome: Progressing   Problem: Clinical Measurements: Goal: Ability to maintain clinical measurements within normal limits will improve 05/09/2023 0520 by Bolling Bushy, RN Outcome: Progressing 05/08/2023 2111 by Bolling Bushy, RN Outcome: Progressing Goal: Will remain free from infection 05/09/2023 0520 by Bolling Bushy, RN Outcome: Progressing 05/08/2023 2111 by Bolling Bushy, RN Outcome: Progressing Goal: Diagnostic test results will improve 05/09/2023 0520 by Bolling Bushy, RN Outcome: Progressing 05/08/2023 2111 by Bolling Bushy, RN Outcome: Progressing Goal: Respiratory complications will improve Outcome: Progressing Goal: Cardiovascular complication will be avoided Outcome: Progressing   Problem: Activity: Goal: Risk for activity intolerance will decrease Outcome: Progressing

## 2023-05-09 NOTE — Progress Notes (Signed)
 [facility]    Date of Admission:  05/06/2023   Total days of antibiotics3   ID: Kathleen Santana is a 89 y.o. female with  bronchopneumonia +/- acute diastolic heart failure Principal Problem:   Acute respiratory failure (HCC) Active Problems:   Acute diastolic heart failure (HCC)   Pneumonia due to infectious organism   Severe tricuspid valve regurgitation   Bronchitis   Lactic acidosis   Paroxysmal atrial fibrillation (HCC)   Chronic anticoagulation   Essential hypertension   Hypothyroidism   Chronic kidney disease, stage III (moderate) (HCC)    Subjective: Patient feeling better. Ambulated twice down the hal  Medications:   albuterol   2.5 mg Nebulization BID   amiodarone   200 mg Oral BID   apixaban   2.5 mg Oral BID   cefadroxil   1,000 mg Oral BID   Chlorhexidine  Gluconate Cloth  6 each Topical Daily   diltiazem   240 mg Oral Daily   furosemide   40 mg Oral Daily   gabapentin   300 mg Oral BID   guaiFENesin   600 mg Oral BID   levothyroxine   50 mcg Oral QAC breakfast   magnesium  gluconate  500 mg Oral Daily   sodium chloride  flush  10-40 mL Intracatheter Q12H   sodium chloride  flush  10-40 mL Intracatheter Q12H   sodium chloride  flush  3 mL Intravenous Q12H    Objective: Vital signs in last 24 hours: Temp:  [97.3 F (36.3 C)-97.7 F (36.5 C)] 97.7 F (36.5 C) (04/25 0405) Pulse Rate:  [54-93] 93 (04/25 0832) Resp:  [16-19] 19 (04/25 0832) BP: (109-149)/(66-87) 109/87 (04/25 0405) SpO2:  [92 %-99 %] 94 % (04/25 0832) Weight:  [55 kg] 55 kg (04/25 0405)  Physical Exam  Constitutional:  oriented to person, place, and time. appears well-developed and well-nourished. No distress.  HENT: [place]/AT, PERRLA, no scleral icterus Mouth/Throat: Oropharynx is clear and moist. No oropharyngeal exudate.  Cardiovascular: Normal rate, regular rhythm and normal heart sounds. Exam reveals no gallop and no friction rub.  No murmur heard.   Pulmonary/Chest: Effort normal and breath sounds normal. No respiratory distress.  has no wheezes.  Neck = supple, no nuchal rigidity Abdominal: Soft. Bowel sounds are normal.  exhibits no distension. There is no tenderness.  Lymphadenopathy: no cervical adenopathy. No axillary adenopathy Neurological: alert and oriented to person, place, and time.  Skin: Skin is warm and dry. No rash noted. No erythema.  Psychiatric: a normal mood and affect.  behavior is normal.    Lab Results Recent Labs    05/08/23 0605 05/08/23 0817 05/09/23 0500  WBC 24.1* 24.7* 18.1*  HGB 10.9* 11.4* 10.6*  HCT 32.4* 33.3* 31.6*  NA 123*  --  130*  K 5.0  --  4.6  CL 91*  --  99  CO2 19*  --  23  BUN 33*  --  33*  CREATININE 1.54*  --  1.46*   Liver Panel Recent Labs    05/07/23 1358 05/08/23 0605 05/09/23 0500  PROT 7.1 6.2* 5.8*  ALBUMIN 3.9 3.3* 3.2*  AST 35 32 28  ALT 29 30 30   ALKPHOS 63 54 57  BILITOT 0.4 0.5 0.5  BILIDIR 0.1  --   --   IBILI 0.3  --   --    Sedimentation Rate No results for input(s): "ESRSEDRATE" in the last 72 hours. C-Reactive Protein No results for input(s): "CRP" in the last 72 hours.  Microbiology:  Studies/Results: DG CHEST  PORT 1 VIEW Result Date: 05/07/2023 CLINICAL DATA:  Status post PICC line EXAM: PORTABLE CHEST 1 VIEW COMPARISON:  May 06, 2023 FINDINGS: Persistent bilateral basilar infiltrates and atelectasis small bilateral pleural effusions and reactions Heart normal size No significant congestive changes Right PICC line tip in the SVC cavoatrial junction. IMPRESSION: Right PICC line tip in the SVC cavoatrial junction. Electronically Signed   By: Fredrich Jefferson M.D.   On: 05/07/2023 18:11     Assessment/Plan: Bronchopneumonia = will transition her to oral cefadroxil  1000mg  po x bid x 2 additional days complete 5 days  Recommend repeat xray to see if her right sided effusion is layering to decide if need thoracentesis  Will give flutter valve to  use to help expectorant  Leukocytosis = improving from leukamoid reaction from steroids  --- will sign off-----------  Josh Niemann for Infectious Diseases Pager: [phone]  05/09/2023, 2:30 PM

## 2023-05-09 NOTE — Progress Notes (Signed)
 PROGRESS NOTE    Kathleen Santana  ZOX:[id number] DOB: September 23, 1926 DOA: 05/06/2023 PCP: Vladimir Groves, PA-C    89/F w hypertension, persistent A-fib, hypothyroidism, GERD presented to the ED with worsening shortness of breath, abdominal distention and leg swelling -Underwent cardioversion for A-fib last month, quickly reverted back to A-fib again.  Presented to the ED with worsening dyspnea on exertion X 1 to 2 days, has a dry cough, no fevers or chills. - Also treated with 2 rounds of Abx for suspected bronchitis by her PCP - In the ED she was hypoxic, briefly placed on BiPAP, treated with diuretics and steroids  - Labs noted WBC of 9.8, sodium 134, creatinine 1.4, BNP 227, troponin 8, lactic acid 4.4, chest x-ray noted bibasilar infiltrates, atelectasis and small pleural effusions -CT chest noted bilateral pleural effusions and atelectasis  Subjective: - Breathing slowly improving, still with intermittent cough  Assessment and Plan:  Acute respiratory failure with hypoxia diastolic congestive heart failure Severe tricuspid valve regurgitation -Presented to the ED with worsening dyspnea, edema and abdominal distention -Recent echo 4/15 noted EF 50-55%, indeterminate diastolic parameters, moderate to severe TR, moderate MR -CT chest with bilateral pleural effusions, atelectasis -Diuretics held yesterday - Will start oral Lasix  today   Lactic acidosis, leukocytosis ?  Viral bronchitis -CT chest with atelectasis and pleural effusions, Rx w/ 2 courses of Abx recently -Clinically did not appear to be in shock, Septic or cardiogenic - Co. ox was in the 60s - Blood cultures are negative, respiratory virus panel is negative -Suspect leukocytosis was leukemoid reaction after steroids in the ED - Now improving -Antibiotics discontinued, ID following   Paroxysmal atrial fibrillation on chronic anticoagulation Patient currently in atrial fibrillation, but appears rate controlled.   -Continue Eliquis  amiodarone  and diltiazem   Hypothyroidism TSH noted to be 2.2 when checked on 04/02/2023. - Continue levothyroxine    Chronic kidney disease stage IIIb -baseline of 1.1-1.3. -monitor    DVT prophylaxis: Eliquis  Code Status: Full Code    Consults: Cardiology   Family Communication: No family at bedside, called and updated daughter 4/23 Disposition: Home in 1 to 2 days    Objective: Vitals:   05/08/23 2102 05/08/23 2313 05/09/23 0405 05/09/23 0832  BP:  132/77 109/87   Pulse: 90 88 74 93  Resp: 18 18 18 19   Temp:  (!) 97.5 F (36.4 C) 97.7 F (36.5 C)   TempSrc:  Axillary Axillary   SpO2: 99% 92% 95% 94%  Weight:   55 kg   Height:        Intake/Output Summary (Last 24 hours) at 05/09/2023 1001 Last data filed at 05/09/2023 0639 Gross per 24 hour  Intake 100.06 ml  Output 1800 ml  Net -1699.94 ml   Filed Weights   05/07/23 1238 05/08/23 0505 05/09/23 0405  Weight: 55.9 kg 55.5 kg 55 kg    Examination:  General exam: Appears calm and comfortable, AAO x 3, hard of hearing HEENT: + JVD Respiratory system: Decreased breath sounds at the bases Cardiovascular system: S1 & S2 heard, irregular Abd: nondistended, soft and nontender.Normal bowel sounds heard. Central nervous system: Alert and oriented. No focal neurological deficits. Extremities:  trace edema Skin: No rashes Psychiatry:  Mood & affect appropriate.     Data Reviewed:   CBC: Recent Labs  Lab 05/06/23 1153 05/06/23 1205 05/07/23 0553 05/08/23 0605 05/08/23 0817 05/09/23 0500  WBC 9.8  --  11.8* 24.1* 24.7* 18.1*  NEUTROABS 6.4  --   --   --  22.7*  --   HGB 12.0 12.9  12.9 11.0* 10.9* 11.4* 10.6*  HCT 36.9 38.0  38.0 32.7* 32.4* 33.3* 31.6*  MCV 94.9  --  92.4 90.0 91.0 90.0  PLT 268  --  230 258 257 246   Basic Metabolic Panel: Recent Labs  Lab 05/02/23 1426 05/06/23 1153 05/06/23 1205 05/07/23 0553 05/08/23 0605 05/09/23 0500  NA 136 134* 134*  134* 130* 123*  130*  K 5.3* 4.5 4.5  4.5 4.4 5.0 4.6  CL 100 100 101 98 91* 99  CO2 21 21*  --  20* 19* 23  GLUCOSE 83 147* 148* 176* 199* 90  BUN 25 24* 27* 26* 33* 33*  CREATININE 1.32* 1.40* 1.50* 1.70* 1.54* 1.46*  CALCIUM 9.2 9.0  --  8.7* 8.6* 8.5*  MG  --   --   --  2.3  --   --    GFR: Estimated Creatinine Clearance: 18.6 mL/min (A) (by C-G formula based on SCr of 1.46 mg/dL (H)). Liver Function Tests: Recent Labs  Lab 05/06/23 1153 05/07/23 1358 05/08/23 0605 05/09/23 0500  AST 37 35 32 28  ALT 30 29 30 30   ALKPHOS 64 63 54 57  BILITOT 0.5 0.4 0.5 0.5  PROT 6.5 7.1 6.2* 5.8*  ALBUMIN 3.8 3.9 3.3* 3.2*   No results for input(s): "LIPASE", "AMYLASE" in the last 168 hours. No results for input(s): "AMMONIA" in the last 168 hours. Coagulation Profile: No results for input(s): "INR", "PROTIME" in the last 168 hours. Cardiac Enzymes: No results for input(s): "CKTOTAL", "CKMB", "CKMBINDEX", "TROPONINI" in the last 168 hours. BNP (last 3 results) No results for input(s): "PROBNP" in the last 8760 hours. HbA1C: No results for input(s): "HGBA1C" in the last 72 hours. CBG: No results for input(s): "GLUCAP" in the last 168 hours. Lipid Profile: No results for input(s): "CHOL", "HDL", "LDLCALC", "TRIG", "CHOLHDL", "LDLDIRECT" in the last 72 hours. Thyroid Function Tests: No results for input(s): "TSH", "T4TOTAL", "FREET4", "T3FREE", "THYROIDAB" in the last 72 hours. Anemia Panel: No results for input(s): "VITAMINB12", "FOLATE", "FERRITIN", "TIBC", "IRON", "RETICCTPCT" in the last 72 hours. Urine analysis:    Component Value Date/Time   COLORURINE STRAW (A) 05/07/2023 1444   APPEARANCEUR CLEAR 05/07/2023 1444   LABSPEC 1.006 05/07/2023 1444   PHURINE 5.0 05/07/2023 1444   GLUCOSEU NEGATIVE 05/07/2023 1444   HGBUR NEGATIVE 05/07/2023 1444   BILIRUBINUR NEGATIVE 05/07/2023 1444   KETONESUR NEGATIVE 05/07/2023 1444   PROTEINUR NEGATIVE 05/07/2023 1444   NITRITE NEGATIVE 05/07/2023  1444   LEUKOCYTESUR NEGATIVE 05/07/2023 1444   Sepsis Labs: @LABRCNTIP (procalcitonin:4,lacticidven:4)  ) Recent Results (from the past 240 hours)  Culture, blood (routine x 2)     Status: None (Preliminary result)   Collection Time: 05/06/23 12:06 PM   Specimen: BLOOD  Result Value Ref Range Status   Specimen Description BLOOD SITE NOT SPECIFIED  Final   Special Requests   Final    BOTTLES DRAWN AEROBIC AND ANAEROBIC Blood Culture adequate volume   Culture   Final    NO GROWTH 3 DAYS Performed at [facility], [address], [place], [place] [address]    Report Status PENDING  Incomplete  Resp panel by RT-PCR (RSV, Flu A&B, Covid) Anterior Nasal Swab     Status: None   Collection Time: 05/06/23  5:18 PM   Specimen: Anterior Nasal Swab  Result Value Ref Range Status   SARS Coronavirus 2 by RT PCR NEGATIVE NEGATIVE Final   Influenza  A by PCR NEGATIVE NEGATIVE Final   Influenza B by PCR NEGATIVE NEGATIVE Final    Comment: (NOTE) The Xpert Xpress SARS-CoV-2/FLU/RSV plus assay is intended as an aid in the diagnosis of influenza from Nasopharyngeal swab specimens and should not be used as a sole basis for treatment. Nasal washings and aspirates are unacceptable for Xpert Xpress SARS-CoV-2/FLU/RSV testing.  Fact Sheet for Patients: BloggerCourse.com  Fact Sheet for Healthcare Providers: SeriousBroker.it  This test is not yet approved or cleared by the [place]  FDA and has been authorized for detection and/or diagnosis of SARS-CoV-2 by FDA under an Emergency Use Authorization (EUA). This EUA will remain in effect (meaning this test can be used) for the duration of the COVID-19 declaration under Section 564(b)(1) of the Act, 21 U.S.C. section 360bbb-3(b)(1), unless the authorization is terminated or revoked.     Resp Syncytial Virus by PCR NEGATIVE NEGATIVE Final    Comment: (NOTE) Fact Sheet for  Patients: BloggerCourse.com  Fact Sheet for Healthcare Providers: SeriousBroker.it  This test is not yet approved or cleared by the [place]  FDA and has been authorized for detection and/or diagnosis of SARS-CoV-2 by FDA under an Emergency Use Authorization (EUA). This EUA will remain in effect (meaning this test can be used) for the duration of the COVID-19 declaration under Section 564(b)(1) of the Act, 21 U.S.C. section 360bbb-3(b)(1), unless the authorization is terminated or revoked.  Performed at [facility], [address], [place], [place] [address]   Culture, blood (Routine X 2) w Reflex to ID Panel     Status: None (Preliminary result)   Collection Time: 05/07/23  1:58 PM   Specimen: BLOOD LEFT ARM  Result Value Ref Range Status   Specimen Description BLOOD LEFT ARM  Final   Special Requests   Final    BOTTLES DRAWN AEROBIC AND ANAEROBIC Blood Culture adequate volume   Culture   Final    NO GROWTH 2 DAYS Performed at [facility], [address] [place], [place] [address]    Report Status PENDING  Incomplete  Culture, blood (Routine X 2) w Reflex to ID Panel     Status: None (Preliminary result)   Collection Time: 05/07/23  1:58 PM   Specimen: BLOOD LEFT ARM  Result Value Ref Range Status   Specimen Description BLOOD LEFT ARM  Final   Special Requests   Final    BOTTLES DRAWN AEROBIC ONLY Blood Culture adequate volume   Culture   Final    NO GROWTH 2 DAYS Performed at [facility], [address] [place], [place] [address]    Report Status PENDING  Incomplete  Urine Culture (for pregnant, neutropenic or urologic patients or patients with an indwelling urinary catheter)     Status: None   Collection Time: 05/07/23  2:21 PM   Specimen: Urine, Clean Catch  Result Value Ref Range Status   Specimen Description URINE, CLEAN CATCH  Final   Special Requests NONE  Final   Culture   Final    NO  GROWTH Performed at [facility], [address], [place], [place] [address]    Report Status 05/08/2023 FINAL  Final  Culture, blood (Routine X 2) w Reflex to ID Panel     Status: None (Preliminary result)   Collection Time: 05/08/23  8:02 AM   Specimen: BLOOD LEFT ARM  Result Value Ref Range Status   Specimen Description BLOOD LEFT ARM  Final   Special  Requests   Final    BOTTLES DRAWN AEROBIC AND ANAEROBIC Blood Culture adequate volume   Culture   Final    NO GROWTH < 24 HOURS Performed at [facility], [address], [place], [place] [address]    Report Status PENDING  Incomplete  Culture, blood (Routine X 2) w Reflex to ID Panel     Status: None (Preliminary result)   Collection Time: 05/08/23  8:02 AM   Specimen: BLOOD LEFT ARM  Result Value Ref Range Status   Specimen Description BLOOD LEFT ARM  Final   Special Requests   Final    BOTTLES DRAWN AEROBIC AND ANAEROBIC Blood Culture adequate volume   Culture   Final    NO GROWTH < 24 HOURS Performed at [facility], [address], [place], [place] [address]    Report Status PENDING  Incomplete  Respiratory (~20 pathogens) panel by PCR     Status: None   Collection Time: 05/08/23 11:00 AM   Specimen: Nasopharyngeal Swab; Respiratory  Result Value Ref Range Status   Adenovirus NOT DETECTED NOT DETECTED Final   Coronavirus 229E NOT DETECTED NOT DETECTED Final    Comment: (NOTE) The Coronavirus on the Respiratory Panel, DOES NOT test for the novel  Coronavirus (2019 nCoV)    Coronavirus HKU1 NOT DETECTED NOT DETECTED Final   Coronavirus NL63 NOT DETECTED NOT DETECTED Final   Coronavirus OC43 NOT DETECTED NOT DETECTED Final   Metapneumovirus NOT DETECTED NOT DETECTED Final   Rhinovirus / Enterovirus NOT DETECTED NOT DETECTED Final   Influenza A NOT DETECTED NOT DETECTED Final   Influenza B NOT DETECTED NOT DETECTED Final   Parainfluenza Virus 1 NOT DETECTED NOT DETECTED Final   Parainfluenza Virus 2  NOT DETECTED NOT DETECTED Final   Parainfluenza Virus 3 NOT DETECTED NOT DETECTED Final   Parainfluenza Virus 4 NOT DETECTED NOT DETECTED Final   Respiratory Syncytial Virus NOT DETECTED NOT DETECTED Final   Bordetella pertussis NOT DETECTED NOT DETECTED Final   Bordetella Parapertussis NOT DETECTED NOT DETECTED Final   Chlamydophila pneumoniae NOT DETECTED NOT DETECTED Final   Mycoplasma pneumoniae NOT DETECTED NOT DETECTED Final    Comment: Performed at [facility], [address] [place], [place] [address]  MRSA Next Gen by PCR, Nasal     Status: None   Collection Time: 05/08/23  5:19 PM   Specimen: Nasal Mucosa; Nasal Swab  Result Value Ref Range Status   MRSA by PCR Next Gen NOT DETECTED NOT DETECTED Final    Comment: (NOTE) The GeneXpert MRSA Assay (FDA approved for NASAL specimens only), is one component of a comprehensive MRSA colonization surveillance program. It is not intended to diagnose MRSA infection nor to guide or monitor treatment for MRSA infections. Test performance is not FDA approved in patients less than 71 years old. Performed at [facility], [address] [place], [place] [address]      Radiology Studies: DG CHEST PORT 1 VIEW Result Date: 05/07/2023 CLINICAL DATA:  Status post PICC line EXAM: PORTABLE CHEST 1 VIEW COMPARISON:  May 06, 2023 FINDINGS: Persistent bilateral basilar infiltrates and atelectasis small bilateral pleural effusions and reactions Heart normal size No significant congestive changes Right PICC line tip in the SVC cavoatrial junction. IMPRESSION: Right PICC line tip in the SVC cavoatrial junction. Electronically Signed   By: Fredrich Jefferson M.D.   On: 05/07/2023 18:11   US  EKG SITE RITE Result Date: 05/07/2023 If  Site Rite image not attached, placement could not be confirmed due to current cardiac rhythm.    Scheduled Meds:  albuterol   2.5 mg Nebulization BID   amiodarone   200 mg Oral BID   apixaban   2.5 mg Oral BID    Chlorhexidine  Gluconate Cloth  6 each Topical Daily   diltiazem   240 mg Oral Daily   gabapentin   300 mg Oral BID   guaiFENesin   600 mg Oral BID   levothyroxine   50 mcg Oral QAC breakfast   magnesium  gluconate  500 mg Oral Daily   sodium chloride  flush  10-40 mL Intracatheter Q12H   sodium chloride  flush  10-40 mL Intracatheter Q12H   sodium chloride  flush  3 mL Intravenous Q12H   Continuous Infusions:     LOS: 3 days    Time spent:    Deforest Fast, MD [facility]   05/09/2023, 10:01 AM

## 2023-05-09 NOTE — Progress Notes (Signed)
 Rounding Note    Patient Name: Kathleen Santana Date of Encounter: 05/09/2023  [facility] Cardiologist: Oneil Bigness, MD   Subjective   Seen sitting up in the chair, looks much improved today  Inpatient Medications    Scheduled Meds:  albuterol   2.5 mg Nebulization BID   amiodarone   200 mg Oral BID   apixaban   2.5 mg Oral BID   Chlorhexidine  Gluconate Cloth  6 each Topical Daily   diltiazem   240 mg Oral Daily   furosemide   40 mg Oral Daily   gabapentin   300 mg Oral BID   guaiFENesin   600 mg Oral BID   levothyroxine   50 mcg Oral QAC breakfast   magnesium  gluconate  500 mg Oral Daily   sodium chloride  flush  10-40 mL Intracatheter Q12H   sodium chloride  flush  10-40 mL Intracatheter Q12H   sodium chloride  flush  3 mL Intravenous Q12H   Continuous Infusions:   PRN Meds: acetaminophen  **OR** acetaminophen , albuterol , alum & mag hydroxide-simeth, ondansetron  (ZOFRAN ) IV, polyvinyl alcohol , sodium chloride  flush, sodium chloride  flush   Vital Signs    Vitals:   05/08/23 2102 05/08/23 2313 05/09/23 0405 05/09/23 0832  BP:  132/77 109/87   Pulse: 90 88 74 93  Resp: 18 18 18 19   Temp:  (!) 97.5 F (36.4 C) 97.7 F (36.5 C)   TempSrc:  Axillary Axillary   SpO2: 99% 92% 95% 94%  Weight:   55 kg   Height:        Intake/Output Summary (Last 24 hours) at 05/09/2023 1020 Last data filed at 05/09/2023 1610 Gross per 24 hour  Intake 100.06 ml  Output 1800 ml  Net -1699.94 ml      05/09/2023    4:05 AM 05/08/2023    5:05 AM 05/07/2023   12:38 PM  Last 3 Weights  Weight (lbs) 121 lb 3.2 oz 122 lb 4.8 oz 123 lb 3.8 oz  Weight (kg) 54.976 kg 55.475 kg 55.9 kg      Telemetry    Atrial fibrillation with VR in the 80s, no significant pauses noted - Personally Reviewed  ECG    No new tracings - Personally Reviewed  Physical Exam   GEN: No acute distress.   Neck: No JVD Cardiac: irregular rhythm, regular rate Respiratory: rhonchi throughout,  cough seems improved GI: Soft, nontender, non-distended  MS: No edema; No deformity. Neuro:  Nonfocal  Psych: Normal affect   Labs    High Sensitivity Troponin:   Recent Labs  Lab 05/06/23 1153 05/06/23 1455  TROPONINIHS 8 6     Chemistry Recent Labs  Lab 05/07/23 0553 05/07/23 1358 05/08/23 0605 05/09/23 0500  NA 130*  --  123* 130*  K 4.4  --  5.0 4.6  CL 98  --  91* 99  CO2 20*  --  19* 23  GLUCOSE 176*  --  199* 90  BUN 26*  --  33* 33*  CREATININE 1.70*  --  1.54* 1.46*  CALCIUM 8.7*  --  8.6* 8.5*  MG 2.3  --   --   --   PROT  --  7.1 6.2* 5.8*  ALBUMIN  --  3.9 3.3* 3.2*  AST  --  35 32 28  ALT  --  29 30 30   ALKPHOS  --  63 54 57  BILITOT  --  0.4 0.5 0.5  GFRNONAA 28*  --  31* 33*  ANIONGAP 12  --  13  8    Lipids No results for input(s): "CHOL", "TRIG", "HDL", "LABVLDL", "LDLCALC", "CHOLHDL" in the last 168 hours.  Hematology Recent Labs  Lab 05/08/23 0605 05/08/23 0817 05/09/23 0500  WBC 24.1* 24.7* 18.1*  RBC 3.60* 3.66* 3.51*  HGB 10.9* 11.4* 10.6*  HCT 32.4* 33.3* 31.6*  MCV 90.0 91.0 90.0  MCH 30.3 31.1 30.2  MCHC 33.6 34.2 33.5  RDW 15.3 15.3 15.3  PLT 258 257 246   Thyroid No results for input(s): "TSH", "FREET4" in the last 168 hours.  BNP Recent Labs  Lab 05/06/23 1153 05/08/23 0605  BNP 227.8* 285.1*    DDimer No results for input(s): "DDIMER" in the last 168 hours.   Radiology    DG CHEST PORT 1 VIEW Result Date: 05/07/2023 CLINICAL DATA:  Status post PICC line EXAM: PORTABLE CHEST 1 VIEW COMPARISON:  May 06, 2023 FINDINGS: Persistent bilateral basilar infiltrates and atelectasis small bilateral pleural effusions and reactions Heart normal size No significant congestive changes Right PICC line tip in the SVC cavoatrial junction. IMPRESSION: Right PICC line tip in the SVC cavoatrial junction. Electronically Signed   By: Fredrich Jefferson M.D.   On: 05/07/2023 18:11   US  EKG SITE RITE Result Date: 05/07/2023 If Site Rite image  not attached, placement could not be confirmed due to current cardiac rhythm.   Cardiac Studies   Echo 04/29/23  1. Left ventricular ejection fraction, by estimation, is 50 to 55%. The  left ventricle has low normal function. The left ventricle demonstrates  global hypokinesis. There is mild concentric left ventricular hypertrophy.  Left ventricular diastolic  function could not be evaluated.   2. Right ventricular systolic function is normal. The right ventricular  size is normal. There is normal pulmonary artery systolic pressure. The  estimated right ventricular systolic pressure is 24.3 mmHg.   3. Left atrial size was mildly dilated.   4. Right atrial size was severely dilated.   5. The mitral valve is normal in structure. Moderate mitral valve  regurgitation. No evidence of mitral stenosis.   6. Tricuspid valve regurgitation is moderate to severe.   7. The aortic valve is tricuspid. Aortic valve regurgitation is not  visualized. Aortic valve sclerosis/calcification is present, without any  evidence of aortic stenosis.   8. The inferior vena cava is normal in size with greater than 50%  respiratory variability, suggesting right atrial pressure of 3 mmHg.   Patient Profile     89 y.o. female  with a hx of Persistent atrial fibrillation, hypertension, hyperlipidemia, bronchitis, who is being seen for the evaluation of congestive heart failure   Assessment & Plan    Acute respiratory failure Acute on chronic diastolic heart failure Pneumonia Severe TR - BNP mildly elevated - She did receive 1 dose of IV Lasix  with creatinine bump now with hyponatremia - Coox remains reassuring, procalcitonin negative x 2 -Renal function improving with creatinine 1.46 today -Sodium improved from 123-130 -Initial lactic acid 4.4 which is now improved to 2.9 -Respiratory panel negative - Blood cultures remain NGTD - No further diuresis - I think she is stable from a cardiovascular  standpoint    Persistent atrial fibrillation -Now on amiodarone  and Cardizem  - Beta-blocker discontinued due to concern for Raynaud's syndrome - She delayed starting amiodarone  and is now on a p.o. load with 200 mg twice daily x 14 days - No significant pauses on telemetry -Continue 2.5 mg Eliquis  twice daily        For questions or  updates, please contact [facility] Please consult [email or web address] for contact info under        Signed, Lamond Pilot, PA  05/09/2023, 10:20 AM

## 2023-05-09 NOTE — Evaluation (Signed)
 Occupational Therapy Evaluation Patient Details Name: Kathleen Santana MRN: [id number] DOB: 01-08-1927 Today's Date: 05/09/2023   History of Present Illness   Kathleen Santana is a 89 y.o. female admitted 05/06/23 for acute respiratory failure. Pt presented with worsening dyspnea, edema, and abdominal distension. Pt underwent cardioversion last month but quickly reverted back to A-Fib. Chest x-ray noted bibasilar infiltrates, atelectasis, and small pleural effusions. CT chest noted bilateral pleural effusions and atelectasis.  Pt had two round of antibiotics for suspected bronchitis by her PCP. PMH significant for hypertension, persistent atrial fibrillation on chronic anticoagulation, hypothyroidism, osteoporosis, and GERD.     Clinical Impressions Pt is typically active, drives and lives independently. Presents with mild unsteadiness with hallway ambulation, provided hand held assist. Pt is overall functioning at a set up to supervision level and reports feeling well. Will follow acutely, do not anticipate need for post acute OT. Pt plans to stay with her daughter initially upon discharge.      If plan is discharge home, recommend the following:   A little help with walking and/or transfers;A little help with bathing/dressing/bathroom;Assist for transportation;Help with stairs or ramp for entrance     Functional Status Assessment   Patient has had a recent decline in their functional status and demonstrates the ability to make significant improvements in function in a reasonable and predictable amount of time.     Equipment Recommendations   None recommended by OT     Recommendations for Other Services         Precautions/Restrictions   Precautions Precautions: Fall Recall of Precautions/Restrictions: Intact Restrictions Weight Bearing Restrictions Per Provider Order: No     Mobility Bed Mobility               General bed mobility comments: seated EOB  upon arrival, left up in chair    Transfers Overall transfer level: Needs assistance Equipment used: 1 person hand held assist Transfers: Sit to/from Stand Sit to Stand: Supervision                  Balance Overall balance assessment: Needs assistance   Sitting balance-Leahy Scale: Good     Standing balance support: Single extremity supported, No upper extremity supported, Reliant on assistive device for balance Standing balance-Leahy Scale: Fair                             ADL either performed or assessed with clinical judgement   ADL Overall ADL's : Needs assistance/impaired Eating/Feeding: Independent   Grooming: Standing;Supervision/safety;Brushing hair   Upper Body Bathing: Set up;Sitting   Lower Body Bathing: Set up;Sit to/from stand   Upper Body Dressing : Set up;Sitting   Lower Body Dressing: Set up;Sit to/from stand   Toilet Transfer: Minimal assistance;Ambulation           Functional mobility during ADLs: Minimal assistance (hand held assist)       Vision Ability to See in Adequate Light: 0 Adequate Patient Visual Report: No change from baseline       Perception         Praxis         Pertinent Vitals/Pain Pain Assessment Pain Assessment: No/denies pain     Extremity/Trunk Assessment Upper Extremity Assessment Upper Extremity Assessment: Overall WFL for tasks assessed   Lower Extremity Assessment Lower Extremity Assessment: Defer to PT evaluation   Cervical / Trunk Assessment Cervical / Trunk Assessment: Normal   Communication Communication Communication:  Impaired Factors Affecting Communication: Hearing impaired   Cognition Arousal: Alert Behavior During Therapy: WFL for tasks assessed/performed Cognition: No apparent impairments                               Following commands: Intact       Cueing  General Comments   Cueing Techniques: Verbal cues      Exercises     Shoulder  Instructions      Home Living Family/patient expects to be discharged to:: Private residence Living Arrangements: Alone Available Help at Discharge: Family;Available PRN/intermittently Type of Home: House Home Access: Stairs to enter [employer] of Steps: 1 Entrance Stairs-Rails: None Home Layout: One level     Bathroom Shower/Tub: Producer, television/film/video: Handicapped height     Home Equipment: Agricultural consultant (2 wheels);Grab bars - tub/shower          Prior Functioning/Environment Prior Level of Function : Independent/Modified Independent;Driving             Mobility Comments: Ambulates without an AD. Pt has an active lifestyle and walks 3 mile 3x/week with one walking stick. She reports 1 fall in the last 65mo without injury. ADLs Comments: Indep with ADLs/IADLs.    OT Problem List:     OT Treatment/Interventions: Patient/family education;Balance training      OT Goals(Current goals can be found in the care plan section)   Acute Rehab OT Goals OT Goal Formulation: With patient Time For Goal Achievement: 05/23/23 Potential to Achieve Goals: Good   OT Frequency:       Co-evaluation              AM-PAC OT "6 Clicks" Daily Activity     Outcome Measure Help from another person eating meals?: None Help from another person taking care of personal grooming?: A Little Help from another person toileting, which includes using toliet, bedpan, or urinal?: A Little Help from another person bathing (including washing, rinsing, drying)?: A Little Help from another person to put on and taking off regular upper body clothing?: A Little Help from another person to put on and taking off regular lower body clothing?: A Little 6 Click Score: 19   End of Session Equipment Utilized During Treatment: Gait belt Nurse Communication: Mobility status  Activity Tolerance: Patient tolerated treatment well Patient left: in chair;with call bell/phone within  reach  OT Visit Diagnosis: Unsteadiness on feet (R26.81)                Time: 6045-4098 OT Time Calculation (min): 30 min Charges:  OT General Charges $OT Visit: 1 Visit OT Evaluation $OT Eval Low Complexity: 1 Low OT Treatments $Self Care/Home Management : 8-22 mins  Avanell Leigh, OTR/L Acute Rehabilitation Services Office: [phone]  Jonette Nestle 05/09/2023, 10:24 AM

## 2023-05-09 NOTE — Progress Notes (Signed)
 Heart Failure Navigator Progress Note  Assessed for Heart & Vascular TOC clinic readiness.  Patient does not meet criteria due to EF 50-55%, has a scheduled [facility] appointment on 06/13/2023. No HF TOC per Dr. Drexel Gentles. .   Navigator will sign off at this time.    Randie Bustle, BSN, Scientist, clinical (histocompatibility and immunogenetics) Only

## 2023-05-10 DIAGNOSIS — J40 Bronchitis, not specified as acute or chronic: Secondary | ICD-10-CM | POA: Diagnosis not present

## 2023-05-10 DIAGNOSIS — J189 Pneumonia, unspecified organism: Secondary | ICD-10-CM | POA: Diagnosis not present

## 2023-05-10 LAB — CBC
HCT: 32 % — ABNORMAL LOW (ref 36.0–46.0)
Hemoglobin: 10.8 g/dL — ABNORMAL LOW (ref 12.0–15.0)
MCH: 30.9 pg (ref 26.0–34.0)
MCHC: 33.8 g/dL (ref 30.0–36.0)
MCV: 91.4 fL (ref 80.0–100.0)
Platelets: 215 10*3/uL (ref 150–400)
RBC: 3.5 MIL/uL — ABNORMAL LOW (ref 3.87–5.11)
RDW: 15.3 % (ref 11.5–15.5)
WBC: 10.6 10*3/uL — ABNORMAL HIGH (ref 4.0–10.5)
nRBC: 0 % (ref 0.0–0.2)

## 2023-05-10 LAB — BASIC METABOLIC PANEL WITH GFR
Anion gap: 9 (ref 5–15)
BUN: 28 mg/dL — ABNORMAL HIGH (ref 8–23)
CO2: 25 mmol/L (ref 22–32)
Calcium: 8.5 mg/dL — ABNORMAL LOW (ref 8.9–10.3)
Chloride: 100 mmol/L (ref 98–111)
Creatinine, Ser: 1.41 mg/dL — ABNORMAL HIGH (ref 0.44–1.00)
GFR, Estimated: 35 mL/min — ABNORMAL LOW (ref >60)
Glucose, Bld: 83 mg/dL (ref 70–99)
Potassium: 4.2 mmol/L (ref 3.5–5.1)
Sodium: 134 mmol/L — ABNORMAL LOW (ref 135–145)

## 2023-05-10 MED ORDER — SENNOSIDES-DOCUSATE SODIUM 8.6-50 MG PO TABS
1.0000 | ORAL_TABLET | Freq: Two times a day (BID) | ORAL | Status: DC
  Administered 2023-05-11 (×2): 1 via ORAL
  Filled 2023-05-10 (×2): qty 1

## 2023-05-10 MED ORDER — FUROSEMIDE 10 MG/ML IJ SOLN
20.0000 mg | Freq: Two times a day (BID) | INTRAMUSCULAR | Status: DC
  Administered 2023-05-10 – 2023-05-11 (×3): 20 mg via INTRAVENOUS
  Filled 2023-05-10 (×3): qty 2

## 2023-05-10 MED ORDER — ORAL CARE MOUTH RINSE: 15.0000 mL | OROMUCOSAL | Status: DC | PRN

## 2023-05-10 MED ORDER — POTASSIUM CHLORIDE CRYS ER 20 MEQ PO TBCR
40.0000 meq | EXTENDED_RELEASE_TABLET | Freq: Once | ORAL | Status: AC
  Administered 2023-05-10: 40 meq via ORAL
  Filled 2023-05-10: qty 2

## 2023-05-10 NOTE — Plan of Care (Signed)
   Problem: Education: Goal: Knowledge of General Education information will improve Description: Including pain rating scale, medication(s)/side effects and non-pharmacologic comfort measures Outcome: Progressing   Problem: Clinical Measurements: Goal: Ability to maintain clinical measurements within normal limits will improve Outcome: Progressing Goal: Will remain free from infection Outcome: Progressing

## 2023-05-10 NOTE — Progress Notes (Signed)
 PROGRESS NOTE    Kathleen Santana  HKV:[id number] DOB: 24-Aug-1926 DOA: 05/06/2023 PCP: Vladimir Groves, PA-C    89/F w hypertension, persistent A-fib, hypothyroidism, GERD presented to the ED with worsening shortness of breath, abdominal distention and leg swelling -Underwent cardioversion for A-fib last month, quickly reverted back to A-fib again.  Presented to the ED with worsening dyspnea on exertion X 1 to 2 days, has a dry cough, no fevers or chills. - Also treated with 2 rounds of Abx for suspected bronchitis by her PCP - In the ED she was hypoxic, briefly placed on BiPAP, treated with diuretics and steroids  - Labs noted WBC of 9.8, sodium 134, creatinine 1.4, BNP 227, troponin 8, lactic acid 4.4, chest x-ray noted bibasilar infiltrates, atelectasis and small pleural effusions -CT chest noted bilateral pleural effusions and atelectasis  Subjective: - Feels better overall, still has some cough - Chest x-ray overnight with increasing small pleural effusions  Assessment and Plan:  Acute respiratory failure with hypoxia diastolic congestive heart failure Severe tricuspid valve regurgitation -Presented to the ED with worsening dyspnea, edema and abdominal distention -Recent echo 4/15 noted EF 50-55%, indeterminate diastolic parameters, moderate to severe TR, moderate MR -CT chest with bilateral pleural effusions, atelectasis - Diuresed briefly initially and then held diuretics, restarted she was briefly diuresed yesterdayshe was briefly diuresed then Held, restart IV lasix  today 20mg  BID x2 doses, CXR overnight with increasing PL effusions -home tom if stable   Lactic acidosis, leukocytosis ?  Viral bronchitis -CT chest with atelectasis and pleural effusions, Rx w/ 2 courses of Abx recently -Clinically did not appear to be in shock, Septic or cardiogenic - Co. ox was in the 60s - Blood cultures are negative, respiratory virus panel is negative -Suspect leukocytosis was  leukemoid reaction after steroids in the ED - Now improving -Antibiotics changed to PO, 1 more day   Paroxysmal atrial fibrillation on chronic anticoagulation Patient currently in atrial fibrillation, but appears rate controlled.  -Continue Eliquis  amiodarone  and diltiazem   Hypothyroidism TSH noted to be 2.2 when checked on 04/02/2023. - Continue levothyroxine    Chronic kidney disease stage IIIb -baseline of 1.1-1.3. -monitor    DVT prophylaxis: Eliquis  Code Status: Full Code    Consults: Cardiology   Family Communication: No family at bedside, called and updated daughter 4/23 Disposition: Home tom    Objective: Vitals:   05/09/23 0405 05/09/23 0832 05/09/23 1955 05/10/23 0249  BP: 109/87  124/82 130/77  Pulse: 74 93 87 94  Resp: 18 19 19 19   Temp: 97.7 F (36.5 C)  97.6 F (36.4 C) 98 F (36.7 C)  TempSrc: Axillary  Oral Oral  SpO2: 95% 94% 95% 95%  Weight: 55 kg   53.3 kg  Height:       No intake or output data in the 24 hours ending 05/10/23 1001  Filed Weights   05/08/23 0505 05/09/23 0405 05/10/23 0249  Weight: 55.5 kg 55 kg 53.3 kg    Examination:  General exam: Appears calm and comfortable, AAO x 3, hard of hearing HEENT: + JVD Respiratory system: Decreased breath sounds to bases CVS: S1-S2, irregular rhythm Abdomen: Soft, nontender, bowel sounds present Extremities: Trace edema  Skin: No rashes Psychiatry:  Mood & affect appropriate.     Data Reviewed:   CBC: Recent Labs  Lab 05/06/23 1153 05/06/23 1205 05/07/23 0553 05/08/23 0605 05/08/23 0817 05/09/23 0500 05/10/23 0535  WBC 9.8  --  11.8* 24.1* 24.7* 18.1* 10.6*  NEUTROABS 6.4  --   --   --  22.7*  --   --   HGB 12.0   < > 11.0* 10.9* 11.4* 10.6* 10.8*  HCT 36.9   < > 32.7* 32.4* 33.3* 31.6* 32.0*  MCV 94.9  --  92.4 90.0 91.0 90.0 91.4  PLT 268  --  230 258 257 246 215   < > = values in this interval not displayed.   Basic Metabolic Panel: Recent Labs  Lab 05/06/23 1153  05/06/23 1205 05/07/23 0553 05/08/23 0605 05/09/23 0500 05/10/23 0535  NA 134* 134*  134* 130* 123* 130* 134*  K 4.5 4.5  4.5 4.4 5.0 4.6 4.2  CL 100 101 98 91* 99 100  CO2 21*  --  20* 19* 23 25  GLUCOSE 147* 148* 176* 199* 90 83  BUN 24* 27* 26* 33* 33* 28*  CREATININE 1.40* 1.50* 1.70* 1.54* 1.46* 1.41*  CALCIUM 9.0  --  8.7* 8.6* 8.5* 8.5*  MG  --   --  2.3  --   --   --    GFR: Estimated Creatinine Clearance: 19.3 mL/min (A) (by C-G formula based on SCr of 1.41 mg/dL (H)). Liver Function Tests: Recent Labs  Lab 05/06/23 1153 05/07/23 1358 05/08/23 0605 05/09/23 0500  AST 37 35 32 28  ALT 30 29 30 30   ALKPHOS 64 63 54 57  BILITOT 0.5 0.4 0.5 0.5  PROT 6.5 7.1 6.2* 5.8*  ALBUMIN 3.8 3.9 3.3* 3.2*   No results for input(s): "LIPASE", "AMYLASE" in the last 168 hours. No results for input(s): "AMMONIA" in the last 168 hours. Coagulation Profile: No results for input(s): "INR", "PROTIME" in the last 168 hours. Cardiac Enzymes: No results for input(s): "CKTOTAL", "CKMB", "CKMBINDEX", "TROPONINI" in the last 168 hours. BNP (last 3 results) No results for input(s): "PROBNP" in the last 8760 hours. HbA1C: No results for input(s): "HGBA1C" in the last 72 hours. CBG: No results for input(s): "GLUCAP" in the last 168 hours. Lipid Profile: No results for input(s): "CHOL", "HDL", "LDLCALC", "TRIG", "CHOLHDL", "LDLDIRECT" in the last 72 hours. Thyroid Function Tests: No results for input(s): "TSH", "T4TOTAL", "FREET4", "T3FREE", "THYROIDAB" in the last 72 hours. Anemia Panel: No results for input(s): "VITAMINB12", "FOLATE", "FERRITIN", "TIBC", "IRON", "RETICCTPCT" in the last 72 hours. Urine analysis:    Component Value Date/Time   COLORURINE STRAW (A) 05/07/2023 1444   APPEARANCEUR CLEAR 05/07/2023 1444   LABSPEC 1.006 05/07/2023 1444   PHURINE 5.0 05/07/2023 1444   GLUCOSEU NEGATIVE 05/07/2023 1444   HGBUR NEGATIVE 05/07/2023 1444   BILIRUBINUR NEGATIVE 05/07/2023  1444   KETONESUR NEGATIVE 05/07/2023 1444   PROTEINUR NEGATIVE 05/07/2023 1444   NITRITE NEGATIVE 05/07/2023 1444   LEUKOCYTESUR NEGATIVE 05/07/2023 1444   Sepsis Labs: @LABRCNTIP (procalcitonin:4,lacticidven:4)  ) Recent Results (from the past 240 hours)  Culture, blood (routine x 2)     Status: None (Preliminary result)   Collection Time: 05/06/23 12:06 PM   Specimen: BLOOD  Result Value Ref Range Status   Specimen Description BLOOD SITE NOT SPECIFIED  Final   Special Requests   Final    BOTTLES DRAWN AEROBIC AND ANAEROBIC Blood Culture adequate volume   Culture   Final    NO GROWTH 4 DAYS Performed at [facility], [address], [place], [place] [address]    Report Status PENDING  Incomplete  Resp panel by RT-PCR (RSV, Flu A&B, Covid) Anterior Nasal Swab     Status: None   Collection Time: 05/06/23  5:18 PM   Specimen: Anterior Nasal Swab  Result Value Ref Range Status   SARS Coronavirus 2 by RT PCR NEGATIVE NEGATIVE Final   Influenza A by PCR NEGATIVE NEGATIVE Final   Influenza B by PCR NEGATIVE NEGATIVE Final    Comment: (NOTE) The Xpert Xpress SARS-CoV-2/FLU/RSV plus assay is intended as an aid in the diagnosis of influenza from Nasopharyngeal swab specimens and should not be used as a sole basis for treatment. Nasal washings and aspirates are unacceptable for Xpert Xpress SARS-CoV-2/FLU/RSV testing.  Fact Sheet for Patients: BloggerCourse.com  Fact Sheet for Healthcare Providers: SeriousBroker.it  This test is not yet approved or cleared by the [place]  FDA and has been authorized for detection and/or diagnosis of SARS-CoV-2 by FDA under an Emergency Use Authorization (EUA). This EUA will remain in effect (meaning this test can be used) for the duration of the COVID-19 declaration under Section 564(b)(1) of the Act, 21 U.S.C. section 360bbb-3(b)(1), unless the authorization is terminated  or revoked.     Resp Syncytial Virus by PCR NEGATIVE NEGATIVE Final    Comment: (NOTE) Fact Sheet for Patients: BloggerCourse.com  Fact Sheet for Healthcare Providers: SeriousBroker.it  This test is not yet approved or cleared by the [place]  FDA and has been authorized for detection and/or diagnosis of SARS-CoV-2 by FDA under an Emergency Use Authorization (EUA). This EUA will remain in effect (meaning this test can be used) for the duration of the COVID-19 declaration under Section 564(b)(1) of the Act, 21 U.S.C. section 360bbb-3(b)(1), unless the authorization is terminated or revoked.  Performed at [facility], [address], [place], [place] [address]   Culture, blood (Routine X 2) w Reflex to ID Panel     Status: None (Preliminary result)   Collection Time: 05/07/23  1:58 PM   Specimen: BLOOD LEFT ARM  Result Value Ref Range Status   Specimen Description BLOOD LEFT ARM  Final   Special Requests   Final    BOTTLES DRAWN AEROBIC AND ANAEROBIC Blood Culture adequate volume   Culture   Final    NO GROWTH 3 DAYS Performed at [facility], [address], [place], [place] [address]    Report Status PENDING  Incomplete  Culture, blood (Routine X 2) w Reflex to ID Panel     Status: None (Preliminary result)   Collection Time: 05/07/23  1:58 PM   Specimen: BLOOD LEFT ARM  Result Value Ref Range Status   Specimen Description BLOOD LEFT ARM  Final   Special Requests   Final    BOTTLES DRAWN AEROBIC ONLY Blood Culture adequate volume   Culture   Final    NO GROWTH 3 DAYS Performed at [facility], [address], [place], [place] [address]    Report Status PENDING  Incomplete  Urine Culture (for pregnant, neutropenic or urologic patients or patients with an indwelling urinary catheter)     Status: None   Collection Time: 05/07/23  2:21 PM   Specimen: Urine, Clean Catch  Result Value Ref Range Status    Specimen Description URINE, CLEAN CATCH  Final   Special Requests NONE  Final   Culture   Final    NO GROWTH Performed at [facility], [address] [place], [place] [address]    Report Status 05/08/2023 FINAL  Final  Culture, blood (Routine X 2) w Reflex to ID Panel     Status: None (Preliminary result)   Collection Time: 05/08/23  8:02 AM   Specimen: BLOOD LEFT  ARM  Result Value Ref Range Status   Specimen Description BLOOD LEFT ARM  Final   Special Requests   Final    BOTTLES DRAWN AEROBIC AND ANAEROBIC Blood Culture adequate volume   Culture   Final    NO GROWTH 2 DAYS Performed at [facility], [address] [place], [place] [address]    Report Status PENDING  Incomplete  Culture, blood (Routine X 2) w Reflex to ID Panel     Status: None (Preliminary result)   Collection Time: 05/08/23  8:02 AM   Specimen: BLOOD LEFT ARM  Result Value Ref Range Status   Specimen Description BLOOD LEFT ARM  Final   Special Requests   Final    BOTTLES DRAWN AEROBIC AND ANAEROBIC Blood Culture adequate volume   Culture   Final    NO GROWTH 2 DAYS Performed at [facility], [address] [place], [place] [address]    Report Status PENDING  Incomplete  Respiratory (~20 pathogens) panel by PCR     Status: None   Collection Time: 05/08/23 11:00 AM   Specimen: Nasopharyngeal Swab; Respiratory  Result Value Ref Range Status   Adenovirus NOT DETECTED NOT DETECTED Final   Coronavirus 229E NOT DETECTED NOT DETECTED Final    Comment: (NOTE) The Coronavirus on the Respiratory Panel, DOES NOT test for the novel  Coronavirus (2019 nCoV)    Coronavirus HKU1 NOT DETECTED NOT DETECTED Final   Coronavirus NL63 NOT DETECTED NOT DETECTED Final   Coronavirus OC43 NOT DETECTED NOT DETECTED Final   Metapneumovirus NOT DETECTED NOT DETECTED Final   Rhinovirus / Enterovirus NOT DETECTED NOT DETECTED Final   Influenza A NOT DETECTED NOT DETECTED Final   Influenza B NOT DETECTED NOT  DETECTED Final   Parainfluenza Virus 1 NOT DETECTED NOT DETECTED Final   Parainfluenza Virus 2 NOT DETECTED NOT DETECTED Final   Parainfluenza Virus 3 NOT DETECTED NOT DETECTED Final   Parainfluenza Virus 4 NOT DETECTED NOT DETECTED Final   Respiratory Syncytial Virus NOT DETECTED NOT DETECTED Final   Bordetella pertussis NOT DETECTED NOT DETECTED Final   Bordetella Parapertussis NOT DETECTED NOT DETECTED Final   Chlamydophila pneumoniae NOT DETECTED NOT DETECTED Final   Mycoplasma pneumoniae NOT DETECTED NOT DETECTED Final    Comment: Performed at [facility], [address] [place], [place] [address]  MRSA Next Gen by PCR, Nasal     Status: None   Collection Time: 05/08/23  5:19 PM   Specimen: Nasal Mucosa; Nasal Swab  Result Value Ref Range Status   MRSA by PCR Next Gen NOT DETECTED NOT DETECTED Final    Comment: (NOTE) The GeneXpert MRSA Assay (FDA approved for NASAL specimens only), is one component of a comprehensive MRSA colonization surveillance program. It is not intended to diagnose MRSA infection nor to guide or monitor treatment for MRSA infections. Test performance is not FDA approved in patients less than 18 years old. Performed at [facility], [address], [place], [place] [address]      Radiology Studies: DG Chest 2 View Result Date: 05/09/2023 CLINICAL DATA:  Chronic pleural effusions. EXAM: CHEST - 2 VIEW COMPARISON:  X-ray 05/07/2023.  Older exams as well including CT FINDINGS: Persistent bilateral small effusions. Slightly increased bilaterally from the prior x-ray. Apical pleural thickening. Stable cardiopericardial silhouette calcified aorta. No edema. Overlapping cardiac leads. Right-sided PICC with tip along the central SVC. Curvature and degenerative changes of the spine. IMPRESSION: Increasing  small bilateral pleural effusions. Electronically Signed   By: Adrianna Horde M.D.   On: 05/09/2023 19:03     Scheduled Meds:  amiodarone   200 mg Oral  BID   apixaban   2.5 mg Oral BID   cefadroxil   1,000 mg Oral BID   Chlorhexidine  Gluconate Cloth  6 each Topical Daily   diltiazem   240 mg Oral Daily   furosemide   20 mg Intravenous BID   gabapentin   300 mg Oral BID   guaiFENesin   600 mg Oral BID   levothyroxine   50 mcg Oral QAC breakfast   magnesium  gluconate  500 mg Oral Daily   potassium chloride  40 mEq Oral Once   sodium chloride  flush  10-40 mL Intracatheter Q12H   sodium chloride  flush  10-40 mL Intracatheter Q12H   sodium chloride  flush  3 mL Intravenous Q12H   Continuous Infusions:     LOS: 4 days    Time spent:    Deforest Fast, MD [facility]   05/10/2023, 10:01 AM

## 2023-05-10 NOTE — Progress Notes (Signed)
 Mobility Specialist Progress Note;   05/10/23 1122  Mobility  Activity Ambulated with assistance in hallway  Level of Assistance Contact guard assist, steadying assist  Assistive Device None  Distance Ambulated (ft) 250 ft  Activity Response Tolerated well  Mobility Referral Yes  Mobility visit 1 Mobility  Mobility Specialist Start Time (ACUTE ONLY) 1122  Mobility Specialist Stop Time (ACUTE ONLY) 1129  Mobility Specialist Time Calculation (min) (ACUTE ONLY) 7 min   Pt eager for mobility. Required MinG assistance during ambulation for safety. 2x LOBs noted while ambulating requiring assistance to recover. VSS throughout and no c/o during session. Pt returned back to chair with all needs met. Daughter in room.   Janit Meline Mobility Specialist Please contact via SecureChat or [employer] [phone]

## 2023-05-11 DIAGNOSIS — J9601 Acute respiratory failure with hypoxia: Secondary | ICD-10-CM | POA: Diagnosis not present

## 2023-05-11 LAB — BASIC METABOLIC PANEL WITH GFR
Anion gap: 8 (ref 5–15)
BUN: 28 mg/dL — ABNORMAL HIGH (ref 8–23)
CO2: 25 mmol/L (ref 22–32)
Calcium: 8.5 mg/dL — ABNORMAL LOW (ref 8.9–10.3)
Chloride: 100 mmol/L (ref 98–111)
Creatinine, Ser: 1.42 mg/dL — ABNORMAL HIGH (ref 0.44–1.00)
GFR, Estimated: 34 mL/min — ABNORMAL LOW (ref >60)
Glucose, Bld: 92 mg/dL (ref 70–99)
Potassium: 4.1 mmol/L (ref 3.5–5.1)
Sodium: 133 mmol/L — ABNORMAL LOW (ref 135–145)

## 2023-05-11 LAB — CULTURE, BLOOD (ROUTINE X 2)
Culture: NO GROWTH
Special Requests: ADEQUATE

## 2023-05-11 MED ORDER — FUROSEMIDE 20 MG PO TABS: 20.0000 mg | ORAL_TABLET | Freq: Every day | ORAL | 0 refills | Status: DC

## 2023-05-11 MED ORDER — CEFADROXIL 500 MG PO CAPS: 500.0000 mg | ORAL_CAPSULE | Freq: Two times a day (BID) | ORAL | 0 refills | Status: AC

## 2023-05-11 NOTE — Plan of Care (Signed)

## 2023-05-11 NOTE — Progress Notes (Signed)
PICC line was removed per order with no complications.  Pt supine and suspended inspiration during removal with instructions to remain supine for 30 minutes after removal.  Pressure held to achieve hemostasis.  Vaseline/gauze/tegaderm applied.  Patient education provided regarding lifting restrictions, site care, and signs of infection.  Pt verbalized understanding and had no questions.

## 2023-05-11 NOTE — Plan of Care (Signed)
  Problem: Education: Goal: Knowledge of General Education information will improve Description: Including pain rating scale, medication(s)/side effects and non-pharmacologic comfort measures Outcome: Adequate for Discharge   Problem: Health Behavior/Discharge Planning: Goal: Ability to manage health-related needs will improve Outcome: Adequate for Discharge   Problem: Clinical Measurements: Goal: Ability to maintain clinical measurements within normal limits will improve Outcome: Adequate for Discharge Goal: Will remain free from infection Outcome: Adequate for Discharge Goal: Diagnostic test results will improve Outcome: Adequate for Discharge Goal: Respiratory complications will improve Outcome: Adequate for Discharge Goal: Cardiovascular complication will be avoided Outcome: Adequate for Discharge   Problem: Activity: Goal: Risk for activity intolerance will decrease Outcome: Adequate for Discharge   Problem: Nutrition: Goal: Adequate nutrition will be maintained Outcome: Adequate for Discharge   Problem: Coping: Goal: Level of anxiety will decrease Outcome: Adequate for Discharge   Problem: Elimination: Goal: Will not experience complications related to bowel motility Outcome: Adequate for Discharge Goal: Will not experience complications related to urinary retention Outcome: Adequate for Discharge   Problem: Pain Managment: Goal: General experience of comfort will improve and/or be controlled Outcome: Adequate for Discharge   Problem: Safety: Goal: Ability to remain free from injury will improve Outcome: Adequate for Discharge   Problem: Skin Integrity: Goal: Risk for impaired skin integrity will decrease Outcome: Adequate for Discharge   Problem: Education: Goal: Ability to demonstrate management of disease process will improve Outcome: Adequate for Discharge Goal: Ability to verbalize understanding of medication therapies will improve Outcome: Adequate  for Discharge Goal: Individualized Educational Video(s) Outcome: Adequate for Discharge   Problem: Activity: Goal: Capacity to carry out activities will improve Outcome: Adequate for Discharge   Problem: Cardiac: Goal: Ability to achieve and maintain adequate cardiopulmonary perfusion will improve Outcome: Adequate for Discharge   Problem: Acute Rehab PT Goals(only PT should resolve) Goal: Patient Will Transfer Sit To/From Stand Outcome: Adequate for Discharge Goal: Pt Will Ambulate Outcome: Adequate for Discharge Goal: Pt Will Go Up/Down Stairs Outcome: Adequate for Discharge   Problem: Acute Rehab OT Goals (only OT should resolve) Goal: Pt. Will Transfer To Toilet Outcome: Adequate for Discharge Goal: Pt. Will Perform Toileting-Clothing Manipulation Outcome: Adequate for Discharge Goal: Pt. Will Perform Tub/Shower Transfer Outcome: Adequate for Discharge Goal: OT Additional ADL Goal #1 Outcome: Adequate for Discharge

## 2023-05-11 NOTE — Discharge Summary (Signed)
 Physician Discharge Summary  SHADIYA HALLINAN UJW:[id number] DOB: 01-01-1927 DOA: 05/06/2023  PCP: Vladimir Groves, PA-C  Admit date: 05/06/2023 Discharge date: 05/11/2023  Time spent: 45 minutes  Recommendations for Outpatient Follow-up:  Cardiology Dr. Cooper Denver in 2 weeks   Discharge Diagnoses:  Principal Problem:   Acute respiratory failure (HCC)   Acute diastolic heart failure (HCC)   Severe tricuspid valve regurgitation   Lactic acidosis   Bronchitis   Paroxysmal atrial fibrillation (HCC)   Chronic anticoagulation   Essential hypertension   Hypothyroidism   Chronic kidney disease, stage III (moderate) (HCC)   Discharge Condition: Improved  Diet recommendation: Low-sodium, heart healthy  Filed Weights   05/09/23 0405 05/10/23 0249 05/11/23 0628  Weight: 55 kg 53.3 kg 51.5 kg    History of present illness:  89/F w hypertension, persistent A-fib, hypothyroidism, GERD presented to the ED with worsening shortness of breath, abdominal distention and leg swelling -Underwent cardioversion for A-fib last month, quickly reverted back to A-fib again.  Presented to the ED with worsening dyspnea on exertion X 1 to 2 days, has a dry cough, no fevers or chills. - Also treated with 2 rounds of Abx for suspected bronchitis by her PCP - In the ED she was hypoxic, briefly placed on BiPAP, treated with diuretics and steroids  - Labs noted WBC of 9.8, sodium 134, creatinine 1.4, BNP 227, troponin 8, lactic acid 4.4, chest x-ray noted bibasilar infiltrates, atelectasis and small pleural effusions -CT chest noted bilateral pleural effusions and atelectasis  Hospital Course:  Acute respiratory failure with hypoxia diastolic congestive heart failure Severe tricuspid valve regurgitation -Presented to the ED with worsening dyspnea, edema and abdominal distention -Recent echo 4/15 noted EF 50-55%, indeterminate diastolic parameters, moderate to severe TR, moderate MR -CT chest with bilateral  pleural effusions, atelectasis - Diuresed briefly initially and then held diuretics, restarted yesterday, volume status is improved, weight down 10 LB to 113 at discharge - Added oral Lasix  20 Mg daily -Follow-up with [facility]   Lactic acidosis, leukocytosis Bronchitis, possibly viral -CT chest with atelectasis and pleural effusions, Rx w/ 2 courses of Abx recently - Initially had significant lactic acidosis in the range of 4, 4.4 with leukocytosis but clinically did not appear to be in shock, Septic or cardiogenic - Blood cultures are negative, respiratory virus panel is negative, sputum cultures are negative -Suspect leukocytosis was leukemoid reaction after steroids in the ED - Now improving - Treated with IV Unasyn  initially then changed to oral antibiotics continue 1 more day to complete 5-day course   Paroxysmal atrial fibrillation on chronic anticoagulation Patient currently in atrial fibrillation, but appears rate controlled.  -Continue Eliquis  amiodarone  and diltiazem    Hypothyroidism TSH noted to be 2.2 when checked on 04/02/2023. - Continue levothyroxine    Chronic kidney disease stage IIIb -baseline of 1.1-1.3. -monitor   Discharge Exam: Vitals:   05/11/23 0833 05/11/23 1125  BP: (!) 140/69 120/67  Pulse:    Resp: 17 18  Temp:  (!) 97.5 F (36.4 C)  SpO2: 97% 96%   General exam: Appears calm and comfortable, AAO x 3, hard of hearing HEENT: no  JVD Respiratory system: improved air movement, clear today CVS: S1-S2, irregular rhythm Abdomen: Soft, nontender, bowel sounds present Extremities: no edema  Skin: No rashes Psychiatry:  Mood & affect appropriate.     Discharge Instructions   Discharge Instructions     Diet - low sodium heart healthy   Complete by: As directed  Increase activity slowly   Complete by: As directed       Allergies as of 05/11/2023       Reactions   Nickel Rash   Norco [hydrocodone-acetaminophen ] Nausea And Vomiting,  Other (See Comments)   Passed out, dizziness   Other Other (See Comments), Cough   Seasonal allergies- congestion and itchy eyes, also        Medication List     TAKE these medications    albuterol  108 (90 Base) MCG/ACT inhaler Commonly known as: Ventolin  HFA Inhale 2 puffs into the lungs every 4 (four) hours as needed for wheezing or shortness of breath.   amiodarone  200 MG tablet Commonly known as: Pacerone  Take 200 MG by mouth twice daily for 7 days. Then 200 MG by mouth daily.   apixaban  2.5 MG Tabs tablet Commonly known as: Eliquis  Take 1 tablet (2.5 mg total) by mouth 2 (two) times daily.   cefadroxil  500 MG capsule Commonly known as: DURICEF Take 1 capsule (500 mg total) by mouth 2 (two) times daily for 1 day.   cholecalciferol 25 MCG (1000 UNIT) tablet Commonly known as: VITAMIN D3 Take 1,000 Units by mouth daily.   cyanocobalamin 1000 MCG tablet Commonly known as: VITAMIN B12 Take 1,000 mcg by mouth daily.   diltiazem  240 MG 24 hr capsule Commonly known as: DILACOR XR  Take 1 capsule (240 mg total) by mouth daily.   furosemide  20 MG tablet Commonly known as: Lasix  Take 1 tablet (20 mg total) by mouth daily.   gabapentin  300 MG capsule Commonly known as: NEURONTIN  Take 300 mg by mouth 2 (two) times daily.   ipratropium 0.06 % nasal spray Commonly known as: ATROVENT  Place 1 spray into both nostrils 3 (three) times daily as needed for rhinitis.   levothyroxine  50 MCG tablet Commonly known as: SYNTHROID  Take 50 mcg by mouth daily before breakfast.   magnesium  gluconate 500 (27 Mg) MG Tabs tablet Commonly known as: MAGONATE Take 500 mg by mouth daily.   multivitamin with minerals Tabs tablet Take 1 tablet by mouth daily with breakfast. One A Day for Women   Systane Complete PF 0.6 % Soln Generic drug: Propylene Glycol (PF) Place 1 drop into both eyes 3 (three) times daily as needed (dry/irritated eyes.).       Allergies  Allergen Reactions    Nickel Rash   Norco [Hydrocodone-Acetaminophen ] Nausea And Vomiting and Other (See Comments)    Passed out, dizziness   Other Other (See Comments) and Cough    Seasonal allergies- congestion and itchy eyes, also      The results of significant diagnostics from this hospitalization (including imaging, microbiology, ancillary and laboratory) are listed below for reference.    Significant Diagnostic Studies: DG Chest 2 View Result Date: 05/09/2023 CLINICAL DATA:  Chronic pleural effusions. EXAM: CHEST - 2 VIEW COMPARISON:  X-ray 05/07/2023.  Older exams as well including CT FINDINGS: Persistent bilateral small effusions. Slightly increased bilaterally from the prior x-ray. Apical pleural thickening. Stable cardiopericardial silhouette calcified aorta. No edema. Overlapping cardiac leads. Right-sided PICC with tip along the central SVC. Curvature and degenerative changes of the spine. IMPRESSION: Increasing small bilateral pleural effusions. Electronically Signed   By: Adrianna Horde M.D.   On: 05/09/2023 19:03   DG CHEST PORT 1 VIEW Result Date: 05/07/2023 CLINICAL DATA:  Status post PICC line EXAM: PORTABLE CHEST 1 VIEW COMPARISON:  May 06, 2023 FINDINGS: Persistent bilateral basilar infiltrates and atelectasis small bilateral pleural effusions and reactions Heart  normal size No significant congestive changes Right PICC line tip in the SVC cavoatrial junction. IMPRESSION: Right PICC line tip in the SVC cavoatrial junction. Electronically Signed   By: Fredrich Jefferson M.D.   On: 05/07/2023 18:11   US  EKG SITE RITE Result Date: 05/07/2023 If Site Rite image not attached, placement could not be confirmed due to current cardiac rhythm.  CT CHEST WO CONTRAST Result Date: 05/06/2023 CLINICAL DATA:  Respiratory illness EXAM: CT CHEST WITHOUT CONTRAST TECHNIQUE: Multidetector CT imaging of the chest was performed following the standard protocol without IV contrast. RADIATION DOSE REDUCTION: This exam was  performed according to the departmental dose-optimization program which includes automated exposure control, adjustment of the mA and/or kV according to patient size and/or use of iterative reconstruction technique. COMPARISON:  05/06/2023, CT 01/10/2022 FINDINGS: Cardiovascular: Limited evaluation without intravenous contrast. Moderate aortic atherosclerosis. No aneurysm. Coronary vascular calcification. Mild cardiomegaly. No sizable pericardial effusion Mediastinum/Nodes: Patent trachea. No thyroid mass. No suspicious lymph nodes. Esophagus within normal limits Lungs/Pleura: Small left and small moderate right pleural effusions. Partial lower lobe consolidations favored to represent passive atelectasis. Scarring or atelectasis at the right middle lobe. No pneumothorax Upper Abdomen: No acute finding Musculoskeletal: No acute osseous abnormality. Multilevel degenerative changes. IMPRESSION: 1. Cardiomegaly with small left and small moderate pleural effusions and probable passive atelectasis at the lower lobes. Aortic Atherosclerosis (ICD10-I70.0). Electronically Signed   By: Esmeralda Hedge M.D.   On: 05/06/2023 18:15   DG Chest Port 1 View Result Date: 05/06/2023 CLINICAL DATA:  Shortness of breath EXAM: PORTABLE CHEST 1 VIEW COMPARISON:  CT chest January 10, 2022 FINDINGS: Bilateral basilar pulmonary infiltrates and atelectasis with a small bilateral pleural effusions could correlate with pneumonia. Heart and mediastinum normal IMPRESSION: Bilateral basilar pulmonary infiltrates and atelectasis with a small bilateral pleural effusions could correlate with pneumonia. Electronically Signed   By: Fredrich Jefferson M.D.   On: 05/06/2023 13:58   ECHOCARDIOGRAM COMPLETE Result Date: 04/29/2023    ECHOCARDIOGRAM REPORT   Patient Name:   NAYDEAN KINNIE Date of Exam: 04/29/2023 Medical Rec #:  [id number]           Height:       62.0 in Accession #:    [id number]          Weight:       112.0 lb Date of Birth:  10-07-1926             BSA:          1.494 m Patient Age:    89 years            BP:           116/84 mmHg Patient Gender: F                   HR:           151 bpm. Exam Location:  [address] Procedure: 2D Echo, Cardiac Doppler and Color Doppler (Both Spectral and Color            Flow Doppler were utilized during procedure). Indications:    I48.0 Paroxysmal atrial fibrillation  History:        Patient has prior history of Echocardiogram examinations, most                 recent 02/29/2020. Arrythmias:Atrial Fibrillation and Atrial                 Flutter; Risk Factors:Hypertension, Dyslipidemia and Former  Smoker.  Sonographer:    Lula Sale RDCS Referring Phys: [id number] Cathay Clonts O'NEAL IMPRESSIONS  1. Left ventricular ejection fraction, by estimation, is 50 to 55%. The left ventricle has low normal function. The left ventricle demonstrates global hypokinesis. There is mild concentric left ventricular hypertrophy. Left ventricular diastolic function could not be evaluated.  2. Right ventricular systolic function is normal. The right ventricular size is normal. There is normal pulmonary artery systolic pressure. The estimated right ventricular systolic pressure is 24.3 mmHg.  3. Left atrial size was mildly dilated.  4. Right atrial size was severely dilated.  5. The mitral valve is normal in structure. Moderate mitral valve regurgitation. No evidence of mitral stenosis.  6. Tricuspid valve regurgitation is moderate to severe.  7. The aortic valve is tricuspid. Aortic valve regurgitation is not visualized. Aortic valve sclerosis/calcification is present, without any evidence of aortic stenosis.  8. The inferior vena cava is normal in size with greater than 50% respiratory variability, suggesting right atrial pressure of 3 mmHg. FINDINGS  Left Ventricle: Left ventricular ejection fraction, by estimation, is 50 to 55%. The left ventricle has low normal function. The left ventricle demonstrates global  hypokinesis. The left ventricular internal cavity size was normal in size. There is mild concentric left ventricular hypertrophy. Left ventricular diastolic function could not be evaluated. Right Ventricle: The right ventricular size is normal. No increase in right ventricular wall thickness. Right ventricular systolic function is normal. There is normal pulmonary artery systolic pressure. The tricuspid regurgitant velocity is 2.31 m/s, and  with an assumed right atrial pressure of 3 mmHg, the estimated right ventricular systolic pressure is 24.3 mmHg. Left Atrium: Left atrial size was mildly dilated. Right Atrium: Right atrial size was severely dilated. Pericardium: There is no evidence of pericardial effusion. Mitral Valve: The mitral valve is normal in structure. Moderate mitral valve regurgitation. No evidence of mitral valve stenosis. Tricuspid Valve: The tricuspid valve is normal in structure. Tricuspid valve regurgitation is moderate to severe. No evidence of tricuspid stenosis. Aortic Valve: The aortic valve is tricuspid. Aortic valve regurgitation is not visualized. Aortic valve sclerosis/calcification is present, without any evidence of aortic stenosis. Pulmonic Valve: The pulmonic valve was normal in structure. Pulmonic valve regurgitation is mild. No evidence of pulmonic stenosis. Aorta: The aortic root is normal in size and structure. Venous: The inferior vena cava is normal in size with greater than 50% respiratory variability, suggesting right atrial pressure of 3 mmHg. IAS/Shunts: No atrial level shunt detected by color flow Doppler.  LEFT VENTRICLE PLAX 2D LVIDd:         3.90 cm LVIDs:         2.50 cm LV PW:         1.20 cm LV IVS:        1.10 cm LVOT diam:     1.70 cm LV SV:         24 LV SV Index:   16 LVOT Area:     2.27 cm  RIGHT VENTRICLE            IVC RVSP:           24.3 mmHg  IVC diam: 2.00 cm LEFT ATRIUM             Index        RIGHT ATRIUM           Index LA diam:        4.40 cm 2.94  cm/m  RA Pressure: 3.00 mmHg LA Vol (A2C):   57.9 ml 38.75 ml/m  RA Area:     22.90 cm LA Vol (A4C):   50.5 ml 33.80 ml/m  RA Volume:   76.50 ml  51.20 ml/m LA Biplane Vol: 56.5 ml 37.81 ml/m  AORTIC VALVE LVOT Vmax:   71.38 cm/s LVOT Vmean:  46.540 cm/s LVOT VTI:    0.105 m  AORTA Ao Root diam: 2.80 cm Ao Asc diam:  3.20 cm MITRAL VALVE                TRICUSPID VALVE MV Area (PHT): 4.77 cm     TR Peak grad:   21.3 mmHg MV Decel Time: 159 msec     TR Vmax:        231.00 cm/s MV E velocity: 121.20 cm/s  Estimated RAP:  3.00 mmHg                             RVSP:           24.3 mmHg                              SHUNTS                             Systemic VTI:  0.11 m                             Systemic Diam: 1.70 cm Gaylyn Keas MD Electronically signed by Gaylyn Keas MD Signature Date/Time: 04/29/2023/2:46:53 PM    Final     Microbiology: Recent Results (from the past 240 hours)  Culture, blood (routine x 2)     Status: None   Collection Time: 05/06/23 12:06 PM   Specimen: BLOOD  Result Value Ref Range Status   Specimen Description BLOOD SITE NOT SPECIFIED  Final   Special Requests   Final    BOTTLES DRAWN AEROBIC AND ANAEROBIC Blood Culture adequate volume   Culture   Final    NO GROWTH 5 DAYS Performed at [facility], [address] [place], [place] [address]    Report Status 05/11/2023 FINAL  Final  Resp panel by RT-PCR (RSV, Flu A&B, Covid) Anterior Nasal Swab     Status: None   Collection Time: 05/06/23  5:18 PM   Specimen: Anterior Nasal Swab  Result Value Ref Range Status   SARS Coronavirus 2 by RT PCR NEGATIVE NEGATIVE Final   Influenza A by PCR NEGATIVE NEGATIVE Final   Influenza B by PCR NEGATIVE NEGATIVE Final    Comment: (NOTE) The Xpert Xpress SARS-CoV-2/FLU/RSV plus assay is intended as an aid in the diagnosis of influenza from Nasopharyngeal swab specimens and should not be used as a sole basis for treatment. Nasal washings and aspirates are unacceptable for  Xpert Xpress SARS-CoV-2/FLU/RSV testing.  Fact Sheet for Patients: BloggerCourse.com  Fact Sheet for Healthcare Providers: SeriousBroker.it  This test is not yet approved or cleared by the [place]  FDA and has been authorized for detection and/or diagnosis of SARS-CoV-2 by FDA under an Emergency Use Authorization (EUA). This EUA will remain in effect (meaning this test can be used) for the duration of the COVID-19 declaration under Section 564(b)(1) of the Act, 21 U.S.C. section 360bbb-3(b)(1), unless the authorization is terminated  or revoked.     Resp Syncytial Virus by PCR NEGATIVE NEGATIVE Final    Comment: (NOTE) Fact Sheet for Patients: BloggerCourse.com  Fact Sheet for Healthcare Providers: SeriousBroker.it  This test is not yet approved or cleared by the [place]  FDA and has been authorized for detection and/or diagnosis of SARS-CoV-2 by FDA under an Emergency Use Authorization (EUA). This EUA will remain in effect (meaning this test can be used) for the duration of the COVID-19 declaration under Section 564(b)(1) of the Act, 21 U.S.C. section 360bbb-3(b)(1), unless the authorization is terminated or revoked.  Performed at [facility], [address], [place], [place] [address]   Culture, blood (Routine X 2) w Reflex to ID Panel     Status: None (Preliminary result)   Collection Time: 05/07/23  1:58 PM   Specimen: BLOOD LEFT ARM  Result Value Ref Range Status   Specimen Description BLOOD LEFT ARM  Final   Special Requests   Final    BOTTLES DRAWN AEROBIC AND ANAEROBIC Blood Culture adequate volume   Culture   Final    NO GROWTH 4 DAYS Performed at [facility], [address], [place], [place] [address]    Report Status PENDING  Incomplete  Culture, blood (Routine X 2) w Reflex to ID Panel     Status: None (Preliminary result)   Collection  Time: 05/07/23  1:58 PM   Specimen: BLOOD LEFT ARM  Result Value Ref Range Status   Specimen Description BLOOD LEFT ARM  Final   Special Requests   Final    BOTTLES DRAWN AEROBIC ONLY Blood Culture adequate volume   Culture   Final    NO GROWTH 4 DAYS Performed at [facility], [address] [place], [place] [address]    Report Status PENDING  Incomplete  Urine Culture (for pregnant, neutropenic or urologic patients or patients with an indwelling urinary catheter)     Status: None   Collection Time: 05/07/23  2:21 PM   Specimen: Urine, Clean Catch  Result Value Ref Range Status   Specimen Description URINE, CLEAN CATCH  Final   Special Requests NONE  Final   Culture   Final    NO GROWTH Performed at [facility], [address] [place], [place] [address]    Report Status 05/08/2023 FINAL  Final  Culture, blood (Routine X 2) w Reflex to ID Panel     Status: None (Preliminary result)   Collection Time: 05/08/23  8:02 AM   Specimen: BLOOD LEFT ARM  Result Value Ref Range Status   Specimen Description BLOOD LEFT ARM  Final   Special Requests   Final    BOTTLES DRAWN AEROBIC AND ANAEROBIC Blood Culture adequate volume   Culture   Final    NO GROWTH 3 DAYS Performed at [facility], [address], [place], [place] [address]    Report Status PENDING  Incomplete  Culture, blood (Routine X 2) w Reflex to ID Panel     Status: None (Preliminary result)   Collection Time: 05/08/23  8:02 AM   Specimen: BLOOD LEFT ARM  Result Value Ref Range Status   Specimen Description BLOOD LEFT ARM  Final   Special Requests   Final    BOTTLES DRAWN AEROBIC AND ANAEROBIC Blood Culture adequate volume   Culture   Final    NO GROWTH 3 DAYS Performed at [facility], [address] [place], [place]  [address]    Report Status PENDING  Incomplete  Respiratory (~20 pathogens) panel by PCR     Status: None   Collection Time: 05/08/23 11:00 AM   Specimen: Nasopharyngeal Swab;  Respiratory  Result Value Ref Range Status   Adenovirus NOT DETECTED NOT DETECTED Final   Coronavirus 229E NOT DETECTED NOT DETECTED Final    Comment: (NOTE) The Coronavirus on the Respiratory Panel, DOES NOT test for the novel  Coronavirus (2019 nCoV)    Coronavirus HKU1 NOT DETECTED NOT DETECTED Final   Coronavirus NL63 NOT DETECTED NOT DETECTED Final   Coronavirus OC43 NOT DETECTED NOT DETECTED Final   Metapneumovirus NOT DETECTED NOT DETECTED Final   Rhinovirus / Enterovirus NOT DETECTED NOT DETECTED Final   Influenza A NOT DETECTED NOT DETECTED Final   Influenza B NOT DETECTED NOT DETECTED Final   Parainfluenza Virus 1 NOT DETECTED NOT DETECTED Final   Parainfluenza Virus 2 NOT DETECTED NOT DETECTED Final   Parainfluenza Virus 3 NOT DETECTED NOT DETECTED Final   Parainfluenza Virus 4 NOT DETECTED NOT DETECTED Final   Respiratory Syncytial Virus NOT DETECTED NOT DETECTED Final   Bordetella pertussis NOT DETECTED NOT DETECTED Final   Bordetella Parapertussis NOT DETECTED NOT DETECTED Final   Chlamydophila pneumoniae NOT DETECTED NOT DETECTED Final   Mycoplasma pneumoniae NOT DETECTED NOT DETECTED Final    Comment: Performed at [facility], [address], [place], [place] [address]  MRSA Next Gen by PCR, Nasal     Status: None   Collection Time: 05/08/23  5:19 PM   Specimen: Nasal Mucosa; Nasal Swab  Result Value Ref Range Status   MRSA by PCR Next Gen NOT DETECTED NOT DETECTED Final    Comment: (NOTE) The GeneXpert MRSA Assay (FDA approved for NASAL specimens only), is one component of a comprehensive MRSA colonization surveillance program. It is not intended to diagnose MRSA infection nor to guide or monitor treatment for MRSA infections. Test performance is not FDA approved in patients less than 40 years old. Performed at [facility], [address] [place], [place] [address]      Labs: Basic Metabolic Panel: Recent Labs  Lab 05/07/23 0553  05/08/23 0605 05/09/23 0500 05/10/23 0535 05/11/23 0541  NA 130* 123* 130* 134* 133*  K 4.4 5.0 4.6 4.2 4.1  CL 98 91* 99 100 100  CO2 20* 19* 23 25 25   GLUCOSE 176* 199* 90 83 92  BUN 26* 33* 33* 28* 28*  CREATININE 1.70* 1.54* 1.46* 1.41* 1.42*  CALCIUM 8.7* 8.6* 8.5* 8.5* 8.5*  MG 2.3  --   --   --   --    Liver Function Tests: Recent Labs  Lab 05/06/23 1153 05/07/23 1358 05/08/23 0605 05/09/23 0500  AST 37 35 32 28  ALT 30 29 30 30   ALKPHOS 64 63 54 57  BILITOT 0.5 0.4 0.5 0.5  PROT 6.5 7.1 6.2* 5.8*  ALBUMIN 3.8 3.9 3.3* 3.2*   No results for input(s): "LIPASE", "AMYLASE" in the last 168 hours. No results for input(s): "AMMONIA" in the last 168 hours. CBC: Recent Labs  Lab 05/06/23 1153 05/06/23 1205 05/07/23 0553 05/08/23 0605 05/08/23 0817 05/09/23 0500 05/10/23 0535  WBC 9.8  --  11.8* 24.1* 24.7* 18.1* 10.6*  NEUTROABS 6.4  --   --   --  22.7*  --   --   HGB 12.0   < > 11.0* 10.9* 11.4* 10.6* 10.8*  HCT 36.9   < >  32.7* 32.4* 33.3* 31.6* 32.0*  MCV 94.9  --  92.4 90.0 91.0 90.0 91.4  PLT 268  --  230 258 257 246 215   < > = values in this interval not displayed.   Cardiac Enzymes: No results for input(s): "CKTOTAL", "CKMB", "CKMBINDEX", "TROPONINI" in the last 168 hours. BNP: BNP (last 3 results) Recent Labs    05/06/23 1153 05/08/23 0605  BNP 227.8* 285.1*    ProBNP (last 3 results) No results for input(s): "PROBNP" in the last 8760 hours.  CBG: No results for input(s): "GLUCAP" in the last 168 hours.     Signed:  Deforest Fast MD.  [facility] 05/11/2023, 1:03 PM

## 2023-05-11 NOTE — Progress Notes (Signed)
 Patient's family have a few concerns before d/c, MD made aware and will talk to patient and family.

## 2023-05-12 ENCOUNTER — Other Ambulatory Visit: Payer: Self-pay | Admitting: Cardiovascular Disease

## 2023-05-12 LAB — CULTURE, BLOOD (ROUTINE X 2)
Culture: NO GROWTH
Culture: NO GROWTH
Special Requests: ADEQUATE
Special Requests: ADEQUATE

## 2023-05-13 LAB — CULTURE, BLOOD (ROUTINE X 2)
Culture: NO GROWTH
Culture: NO GROWTH
Special Requests: ADEQUATE
Special Requests: ADEQUATE

## 2023-05-14 ENCOUNTER — Telehealth: Payer: Self-pay

## 2023-05-14 NOTE — Telephone Encounter (Signed)
 Patient identification verified by 2 forms. Sims Duck, RN     Tried calling patient and contacts listed on file. No answer. LVMTCB. Cardioversion ordered at OV on 4/18 but central scheduling was closed.   Cardioversion scheduled for 5/5 at 10am with Dr. Maximo Spar. See OV AVS for updated procedure instructions.   Will f/u call tomorrow

## 2023-05-15 NOTE — Telephone Encounter (Signed)
 Patient identification verified by 2 forms. Kathleen Duck, RN     Called and spoke to patient's daughter Kathleen Santana.  Kathleen Santana states:  - patient was hospitalized for 6days on 4/22 for SOB and found to have fluid on her lungs.  - patient is aware her cardioversion is coming up and would like to go through with the procedure.  - would like Dr. Rolm Clos opinion on timeframe for procedure.   Patient denies: - missing any doses of anticoag             Interventions/Plan: - Encounter forwarded to primary cardiologist for review.    Kathleen Santana agrees with plan, no questions at this time

## 2023-05-16 NOTE — Telephone Encounter (Signed)
 Patient identification verified by 2 forms. Kathleen Duck, RN     Called and spoke to patient;s daughter Kathleen Santana.   Patient denies:              Interventions/Plan: - Dr. Rolm Clos okay with patient proceeding with cardioversion on 5/5 as long as she is feeling well enough to do it, otherwise we can reschedule.  - Stephenie Einstein will speak with patient and let me know by end of today.   Patient agrees with plan, no questions at this time

## 2023-05-16 NOTE — Progress Notes (Signed)
 Called patient with pre-procedure instructions for Monday May 5th. Spoke with pt's daughter Stephenie Einstein, and informed her of:  Time to arrive for procedure. 0845 Remain NPO past midnight.  Must have a ride home and a responsible adult to remain with them for 24 hours post procedure.  Confirmed blood thinner. Eliquis  Confirmed no breaks in taking blood thinner for 3+ weeks prior to procedure. Confirmed patient stopped all GLP-1s and GLP-2s for at least one week before procedure.

## 2023-05-19 ENCOUNTER — Ambulatory Visit (HOSPITAL_COMMUNITY): Admitting: Anesthesiology

## 2023-05-19 ENCOUNTER — Ambulatory Visit (HOSPITAL_COMMUNITY)
Admission: RE | Admit: 2023-05-19 | Discharge: 2023-05-19 | Disposition: A | Discharge status: Home / Self Care | Attending: Internal Medicine | Admitting: Internal Medicine

## 2023-05-19 ENCOUNTER — Encounter (HOSPITAL_COMMUNITY)
Admission: RE | Disposition: A | Discharge status: Home / Self Care | Payer: Self-pay | Source: Home / Self Care | Attending: Internal Medicine

## 2023-05-19 ENCOUNTER — Other Ambulatory Visit: Payer: Self-pay

## 2023-05-19 ENCOUNTER — Encounter (HOSPITAL_COMMUNITY): Payer: Self-pay | Admitting: Internal Medicine

## 2023-05-19 DIAGNOSIS — Z79899 Other long term (current) drug therapy: Secondary | ICD-10-CM | POA: Diagnosis not present

## 2023-05-19 DIAGNOSIS — I130 Hypertensive heart and chronic kidney disease with heart failure and stage 1 through stage 4 chronic kidney disease, or unspecified chronic kidney disease: Secondary | ICD-10-CM | POA: Insufficient documentation

## 2023-05-19 DIAGNOSIS — Z79890 Hormone replacement therapy (postmenopausal): Secondary | ICD-10-CM | POA: Insufficient documentation

## 2023-05-19 DIAGNOSIS — E872 Acidosis, unspecified: Secondary | ICD-10-CM | POA: Diagnosis not present

## 2023-05-19 DIAGNOSIS — J40 Bronchitis, not specified as acute or chronic: Secondary | ICD-10-CM | POA: Diagnosis not present

## 2023-05-19 DIAGNOSIS — I4819 Other persistent atrial fibrillation: Secondary | ICD-10-CM | POA: Insufficient documentation

## 2023-05-19 DIAGNOSIS — N1832 Chronic kidney disease, stage 3b: Secondary | ICD-10-CM | POA: Insufficient documentation

## 2023-05-19 DIAGNOSIS — K219 Gastro-esophageal reflux disease without esophagitis: Secondary | ICD-10-CM | POA: Diagnosis not present

## 2023-05-19 DIAGNOSIS — Z87891 Personal history of nicotine dependence: Secondary | ICD-10-CM

## 2023-05-19 DIAGNOSIS — I129 Hypertensive chronic kidney disease with stage 1 through stage 4 chronic kidney disease, or unspecified chronic kidney disease: Secondary | ICD-10-CM | POA: Diagnosis not present

## 2023-05-19 DIAGNOSIS — J449 Chronic obstructive pulmonary disease, unspecified: Secondary | ICD-10-CM | POA: Diagnosis not present

## 2023-05-19 DIAGNOSIS — M810 Age-related osteoporosis without current pathological fracture: Secondary | ICD-10-CM | POA: Diagnosis not present

## 2023-05-19 DIAGNOSIS — I4891 Unspecified atrial fibrillation: Secondary | ICD-10-CM

## 2023-05-19 DIAGNOSIS — I5032 Chronic diastolic (congestive) heart failure: Secondary | ICD-10-CM | POA: Insufficient documentation

## 2023-05-19 DIAGNOSIS — N183 Chronic kidney disease, stage 3 unspecified: Secondary | ICD-10-CM | POA: Diagnosis not present

## 2023-05-19 DIAGNOSIS — I480 Paroxysmal atrial fibrillation: Secondary | ICD-10-CM

## 2023-05-19 DIAGNOSIS — Z7901 Long term (current) use of anticoagulants: Secondary | ICD-10-CM | POA: Diagnosis not present

## 2023-05-19 DIAGNOSIS — I10 Essential (primary) hypertension: Secondary | ICD-10-CM | POA: Diagnosis not present

## 2023-05-19 DIAGNOSIS — E039 Hypothyroidism, unspecified: Secondary | ICD-10-CM | POA: Insufficient documentation

## 2023-05-19 SURGERY — CARDIOVERSION (CATH LAB)
Anesthesia: General

## 2023-05-19 MED ORDER — PROPOFOL 10 MG/ML IV BOLUS
INTRAVENOUS | Status: DC | PRN
  Administered 2023-05-19: 50 mg via INTRAVENOUS

## 2023-05-19 MED ORDER — LIDOCAINE 2% (20 MG/ML) 5 ML SYRINGE
INTRAMUSCULAR | Status: DC | PRN
  Administered 2023-05-19: 20 mg via INTRAVENOUS

## 2023-05-19 MED ORDER — SODIUM CHLORIDE 0.9% FLUSH: 3.0000 mL | INTRAVENOUS | Status: DC | PRN

## 2023-05-19 MED ORDER — SODIUM CHLORIDE 0.9% FLUSH: 3.0000 mL | Freq: Two times a day (BID) | INTRAVENOUS | Status: DC

## 2023-05-19 SURGICAL SUPPLY — 1 items: PAD DEFIB RADIO PHYSIO CONN (PAD) ×1 IMPLANT

## 2023-05-19 NOTE — Transfer of Care (Signed)
 Immediate Anesthesia Transfer of Care Note  Patient: Kathleen Santana  Procedure(s) Performed: CARDIOVERSION  Patient Location: PACU  Anesthesia Type:General  Level of Consciousness: drowsy  Airway & Oxygen Therapy: Patient Spontanous Breathing and Patient connected to face mask oxygen  Post-op Assessment: Report given to RN and Post -op Vital signs reviewed and stable  Post vital signs: Reviewed and stable  Last Vitals:  Vitals Value Taken Time  BP    Temp    Pulse 67 05/19/23 1017  Resp 19 05/19/23 1017  SpO2 100 % 05/19/23 1017  Vitals shown include unfiled device data.  Last Pain:  Vitals:   05/19/23 0954  TempSrc:   PainSc: 0-No pain         Complications: No notable events documented.

## 2023-05-19 NOTE — Discharge Instructions (Signed)

## 2023-05-19 NOTE — Interval H&P Note (Signed)
 History and Physical Interval Note:  05/19/2023 9:23 AM  Kathleen Santana  has presented today for surgery, with the diagnosis of AFIB.  The various methods of treatment have been discussed with the patient and family. After consideration of risks, benefits and other options for treatment, the patient has consented to  Procedure(s): CARDIOVERSION (N/A) as a surgical intervention.  The patient's history has been reviewed, patient examined, no change in status, stable for surgery.  I have reviewed the patient's chart and labs.  Questions were answered to the patient's satisfaction.     [name]

## 2023-05-19 NOTE — Anesthesia Preprocedure Evaluation (Addendum)
 Anesthesia Evaluation  Patient identified by MRN, date of birth, ID band Patient awake    Reviewed: Allergy & Precautions, NPO status , Patient's Chart, lab work & pertinent test results  History of Anesthesia Complications Negative for: history of anesthetic complications  Airway Mallampati: II  TM Distance: >3 FB Neck ROM: Full    Dental  (+) Chipped, Dental Advisory Given   Pulmonary COPD,  COPD inhaler, former smoker   breath sounds clear to auscultation       Cardiovascular hypertension, Pt. on medications (-) angina + dysrhythmias Atrial Fibrillation  Rhythm:Irregular Rate:Normal  04/29/2023 ECHO: EF 50 to 55%. 1. The LV has low normal function, global hypokinesis. There is mild concentric LVH   2. RVF is normal. The right ventricular size is normal. There is normal pulmonary artery systolic pressure. The estimated right ventricular systolic pressure is 24.3 mmHg.   3. Left atrial size was mildly dilated.   4. Right atrial size was severely dilated.   5. The mitral valve is normal in structure. Moderate mitral valve regurgitation. No evidence of mitral stenosis.   6. Tricuspid valve regurgitation is moderate to severe.   7. The aortic valve is tricuspid. Aortic valve regurgitation is not visualized. Aortic valve sclerosis/calcification is present, without any evidence of aortic stenosis.     Neuro/Psych negative neurological ROS     GI/Hepatic Neg liver ROS,GERD  Controlled,,  Endo/Other  Hypothyroidism    Renal/GU Renal InsufficiencyRenal disease     Musculoskeletal   Abdominal   Peds  Hematology Eliquis  Hb 10.8, plt 215k   Anesthesia Other Findings   Reproductive/Obstetrics                             Anesthesia Physical Anesthesia Plan  ASA: 3  Anesthesia Plan: General   Post-op Pain Management: Minimal or no pain anticipated   Induction: Intravenous  PONV Risk Score and  Plan: 3 and Treatment may vary due to age or medical condition  Airway Management Planned: Natural Airway and Simple Face Mask  Additional Equipment: None  Intra-op Plan:   Post-operative Plan:   Informed Consent: I have reviewed the patients History and Physical, chart, labs and discussed the procedure including the risks, benefits and alternatives for the proposed anesthesia with the patient or authorized representative who has indicated his/her understanding and acceptance.     Dental advisory given  Plan Discussed with: CRNA and Surgeon  Anesthesia Plan Comments:         Anesthesia Quick Evaluation

## 2023-05-19 NOTE — Anesthesia Postprocedure Evaluation (Signed)
 Anesthesia Post Note  Patient: Kathleen Santana  Procedure(s) Performed: CARDIOVERSION     Patient location during evaluation: Cath Lab Anesthesia Type: General Level of consciousness: awake and alert, patient cooperative and oriented Pain management: pain level controlled Vital Signs Assessment: post-procedure vital signs reviewed and stable Respiratory status: spontaneous breathing, nonlabored ventilation and respiratory function stable Cardiovascular status: blood pressure returned to baseline and stable Postop Assessment: no apparent nausea or vomiting Anesthetic complications: no   No notable events documented.  Last Vitals:  Vitals:   05/19/23 1018 05/19/23 1021  BP: 120/64   Pulse: 67 66  Resp: 19 (!) 25  Temp:    SpO2: 100% 100%    Last Pain:  Vitals:   05/19/23 1006  TempSrc: Temporal  PainSc: Asleep                 Chancellor Vanderloop,E. Ebany Bowermaster

## 2023-05-19 NOTE — Telephone Encounter (Signed)
 Chart review shows patient presented to hospital for cardioversion. Encounter being closed.

## 2023-05-19 NOTE — CV Procedure (Signed)
    CARDIOVERSION NOTE  Procedure: Electrical Cardioversion Indications:  Atrial Fibrillation  Procedure Details:  Consent: Risks of procedure as well as the alternatives and risks of each were explained to the (patient/caregiver).  Consent for procedure obtained.  Time Out: Verified patient identification, verified procedure, site/side was marked, verified correct patient position, special equipment/implants available, medications/allergies/relevent history reviewed, required imaging and test results available.  Performed  Patient placed on cardiac monitor, pulse oximetry, supplemental oxygen as necessary.  Sedation given:  propofol  per anesthesia Pacer pads placed anterior and posterior chest.  Cardioverted 1 time(s).  Cardioverted at 200J biphasic.  Impression: Findings: Post procedure EKG shows: NSR Complications: None Patient did tolerate procedure well.  Plan: Successful DCCV with a single 200J biphasic shock to NSR.  Time Spent Directly with the Patient:  30 minutes   [name], MD, [facility], FNLA, FACP  [place]  [facility]  Medical Director of the [facility] Lipid Disorders &  Cardiovascular Risk Reduction Clinic Diplomate of the American Board of Clinical Lipidology Attending Cardiologist  Direct Dial: [phone]  Fax: [phone]  Website:  [email or web address]  Aviva Lemmings Dathan Attia 05/19/2023, 10:16 AM

## 2023-06-02 NOTE — Telephone Encounter (Signed)
 Please review and advise.

## 2023-06-06 ENCOUNTER — Other Ambulatory Visit: Payer: Self-pay | Admitting: Cardiovascular Disease

## 2023-06-07 DIAGNOSIS — I4891 Unspecified atrial fibrillation: Secondary | ICD-10-CM | POA: Diagnosis not present

## 2023-06-07 DIAGNOSIS — I700 Atherosclerosis of aorta: Secondary | ICD-10-CM | POA: Diagnosis not present

## 2023-06-07 DIAGNOSIS — K219 Gastro-esophageal reflux disease without esophagitis: Secondary | ICD-10-CM | POA: Diagnosis not present

## 2023-06-07 DIAGNOSIS — H353290 Exudative age-related macular degeneration, unspecified eye, stage unspecified: Secondary | ICD-10-CM | POA: Diagnosis not present

## 2023-06-07 DIAGNOSIS — Z8249 Family history of ischemic heart disease and other diseases of the circulatory system: Secondary | ICD-10-CM | POA: Diagnosis not present

## 2023-06-07 DIAGNOSIS — Z87891 Personal history of nicotine dependence: Secondary | ICD-10-CM | POA: Diagnosis not present

## 2023-06-07 DIAGNOSIS — J4489 Other specified chronic obstructive pulmonary disease: Secondary | ICD-10-CM | POA: Diagnosis not present

## 2023-06-07 DIAGNOSIS — N1832 Chronic kidney disease, stage 3b: Secondary | ICD-10-CM | POA: Diagnosis not present

## 2023-06-07 DIAGNOSIS — E039 Hypothyroidism, unspecified: Secondary | ICD-10-CM | POA: Diagnosis not present

## 2023-06-07 DIAGNOSIS — I509 Heart failure, unspecified: Secondary | ICD-10-CM | POA: Diagnosis not present

## 2023-06-07 DIAGNOSIS — D6869 Other thrombophilia: Secondary | ICD-10-CM | POA: Diagnosis not present

## 2023-06-07 DIAGNOSIS — M1990 Unspecified osteoarthritis, unspecified site: Secondary | ICD-10-CM | POA: Diagnosis not present

## 2023-06-12 NOTE — Progress Notes (Unsigned)
 Cardiology Office Note:  .   Date:  06/13/2023  ID:  Kathleen Santana, DOB 08/02/1926, MRN [id number] PCP: Vladimir Groves, PA-C  [facility] Providers Cardiologist:  Oneil Bigness, MD { History of Present Illness: .    Chief Complaint  Patient presents with   Follow-up    Kathleen Santana is a 89 y.o. female with history of persistent Afib who presents for follow-up. Admitted 04/2023 for viral PNA? S/p DCCV 05/19/2023 and now on amiodarone .    History of Present Illness   Kathleen Santana is a 89 year old female with persistent atrial fibrillation who presents for follow-up after recent cardioversion and hospitalization for viral pneumonia.  She underwent cardioversion on May 19, 2023, for persistent atrial fibrillation but continues to experience fatigue and shortness of breath. Despite being on amiodarone  therapy, she believes she has been in atrial fibrillation since the procedure, although she was reportedly in rhythm upon discharge from the hospital. Her heart rate remains elevated, contributing to her fatigue and breathlessness.  She was recently hospitalized for pneumonia of unclear etiology and continues to experience mild cold symptoms. There is suspicion of aspiration due to issues with sinus drainage, particularly at night, which may have contributed to her respiratory symptoms.  Her current medications include Eliquis  and amiodarone . She was previously on diltiazem  but discontinued it due to low heart rate. She is also on Lasix  20 mg daily for peripheral edema, which presents as swelling above her ankle with shooting pain, exacerbated by prolonged sitting. She manages the swelling with compression stockings and leg elevation. Swelling more noticeable in the LLE.   She experiences constipation and takes magnesium  daily. She recently used a laxative but has not had a bowel movement since. She does not regularly use a stool softener or fiber supplement.  She  remains active, attending yoga and aerobics classes, but is hesitant to walk outside alone. She is considering walking at a rec center for safety. She is mindful of her salt intake and tries to keep her legs elevated as much as possible.          Problem List 1. Paroxysmal Afib -Afib with RVR 04/06/2020 -4% Afib burden -CHADSVASC=4 (age, sex, HTN) -DCCV 04/08/2023 & 05/19/2023 2. Atrial tachycardia/PACs -6.3% PAC burden 3. HTN 4. HLD 5. Hypothyroidism     ROS: All other ROS reviewed and negative. Pertinent positives noted in the HPI.     Studies Reviewed: Aaron Aas   EKG Interpretation Date/Time:  Friday Jun 13 2023 09:15:01 EDT Ventricular Rate:  123 PR Interval:    QRS Duration:  94 QT Interval:  430 QTC Calculation: 615 R Axis:   -45  Text Interpretation: Atrial fibrillation with rapid ventricular response Left axis deviation Minimal voltage criteria for LVH, may be normal variant ( Cornell product ) Septal infarct (cited on or before 19-May-2023) ST & T wave abnormality, consider lateral ischemia Confirmed by Jackquelyn Mass [id number]) on 06/13/2023 9:20:10 AM   Physical Exam:   VS:  BP (!) 142/64   Pulse (!) 123   Ht 5\' 2"  (1.575 m)   Wt 115 lb (52.2 kg)   SpO2 98%   BMI 21.03 kg/m    Wt Readings from Last 3 Encounters:  06/13/23 115 lb (52.2 kg)  05/19/23 110 lb (49.9 kg)  05/11/23 113 lb 8 oz (51.5 kg)    GEN: Well nourished, well developed in no acute distress NECK: No JVD; No carotid bruits CARDIAC: irregular rhythm, no murmurs, rubs,  gallops RESPIRATORY:  Clear to auscultation without rales, wheezing or rhonchi  ABDOMEN: Soft, non-tender, non-distended EXTREMITIES:  1+ pitting edema LLE ASSESSMENT AND PLAN: .   Assessment and Plan    Atrial fibrillation, persistent  Persistent atrial fibrillation with heart rate 115-120 bpm. Previous cardioversion and amiodarone  therapy failed. Discussed rate control with medication versus pacemaker. Prioritized medication due to age  and preference. - Discontinue amiodarone . - Initiate diltiazem  120 mg daily, adjust from 240 mg tablet by taking half. - Continue apixaban . - Refer to electrophysiology specialist for further management options.   Peripheral edema Persistent peripheral edema likely due to fluid retention. No DVT evidence due to anticoagulation. - Refill furosemide  prescription, allow for an extra tablet as needed. - Advise use of compression stockings. - Recommend leg elevation. - Monitor dietary sodium intake.  Viral pneumonia? Recent hospitalization for pneumonia, likely viral or bacterial. Symptoms resolved. Aspiration suspected due to weakened pharyngeal muscles causing chemical pneumonitis. - strong suspicion for aspiration events. Encouraged to discuss with PCP.              Follow-up: Return in about 3 months (around 09/13/2023).   Signed, Gigi Kyle. Rolm Clos, MD, [facility]  [facility]  [address], [address] [place], [place] [address] [phone]  4:14 PM

## 2023-06-13 ENCOUNTER — Ambulatory Visit: Attending: Cardiovascular Disease | Admitting: Cardiovascular Disease

## 2023-06-13 ENCOUNTER — Encounter: Payer: Self-pay | Admitting: Cardiovascular Disease

## 2023-06-13 VITALS — BP 142/64 | HR 123 | Ht 62.0 in | Wt 115.0 lb

## 2023-06-13 DIAGNOSIS — I4719 Other supraventricular tachycardia: Secondary | ICD-10-CM

## 2023-06-13 DIAGNOSIS — I10 Essential (primary) hypertension: Secondary | ICD-10-CM

## 2023-06-13 DIAGNOSIS — H43813 Vitreous degeneration, bilateral: Secondary | ICD-10-CM | POA: Diagnosis not present

## 2023-06-13 DIAGNOSIS — I4819 Other persistent atrial fibrillation: Secondary | ICD-10-CM

## 2023-06-13 DIAGNOSIS — H353221 Exudative age-related macular degeneration, left eye, with active choroidal neovascularization: Secondary | ICD-10-CM | POA: Diagnosis not present

## 2023-06-13 DIAGNOSIS — R0602 Shortness of breath: Secondary | ICD-10-CM | POA: Diagnosis not present

## 2023-06-13 DIAGNOSIS — I7300 Raynaud's syndrome without gangrene: Secondary | ICD-10-CM

## 2023-06-13 DIAGNOSIS — H353112 Nonexudative age-related macular degeneration, right eye, intermediate dry stage: Secondary | ICD-10-CM | POA: Diagnosis not present

## 2023-06-13 MED ORDER — DILTIAZEM HCL ER 120 MG PO CP12: 120.0000 mg | ORAL_CAPSULE | Freq: Every day | ORAL | 3 refills | Status: DC

## 2023-06-13 MED ORDER — FUROSEMIDE 20 MG PO TABS: 20.0000 mg | ORAL_TABLET | Freq: Every day | ORAL | 3 refills | Status: DC

## 2023-06-13 NOTE — Patient Instructions (Addendum)
 Medication Instructions:  STOP AMIODARONE   START DILTIAZEM  120MG  BY MOUTH ONCE DAILY    *If you need a refill on your cardiac medications before your next appointment, please call your pharmacy*   Lab Work: NONE    If you have labs (blood work) drawn today and your tests are completely normal, you will receive your results only by: MyChart Message (if you have MyChart) OR A paper copy in the mail If you have any lab test that is abnormal or we need to change your treatment, we will call you to review the results.   Testing/Procedures: NONE    Follow-Up: At [facility], you and your health needs are our priority.  As part of our continuing mission to provide you with exceptional heart care, we have created designated Provider Care Teams.  These Care Teams include your primary Cardiologist (physician) and Advanced Practice Providers (APPs -  Physician Assistants and Nurse Practitioners) who all work together to provide you with the care you need, when you need it.  We recommend signing up for the patient portal called "MyChart".  Sign up information is provided on this After Visit Summary.  MyChart is used to connect with patients for Virtual Visits (Telemedicine).  Patients are able to view lab/test results, encounter notes, upcoming appointments, etc.  Non-urgent messages can be sent to your provider as well.   To learn more about what you can do with MyChart, go to [email or web address].    Your next appointment:   4 month(s)  The format for your next appointment:   In Person  Provider:   Oneil Bigness, MD   Other Instructions   Dr. Rolm Clos has referred you to our ELECTROPHYSIOLOGY DEPARTMENT for management of your atrial fibrillation.

## 2023-06-16 ENCOUNTER — Other Ambulatory Visit: Payer: Self-pay | Admitting: Cardiovascular Disease

## 2023-06-25 NOTE — Progress Notes (Signed)
  Electrophysiology Office Note:    Date:  06/26/2023   ID:  Kathleen Santana, DOB 1926/06/07, MRN [id number]  PCP:  Vladimir Groves, PA-C   [facility] Providers Cardiologist:  Oneil Bigness, MD     Referring MD: Harrold Lincoln, *   History of Present Illness:    Kathleen Santana is a 89 y.o. female with a medical history significant for persistent atrial fibrillation, hypothyroidism referred for management of atrial fibrillation.     She underwent DC cardioversion in March 2025 but quickly reverted to atrial fibrillation.  She was admitted in April for pneumonia, still in atrial fibrillation.  Amiodarone  was started, and she underwent DC cardioversion in May 2025.  She returned in follow-up to see Dr. Rolm Clos a few days later and was noted to be back in atrial fibrillation.  Amiodarone  was discontinued.  She reports that her heart rates at home have been about 80 bpm to 104 bpm.  She has had a significant decline in functional capacity over the past several months.  She walked about 3 miles daily in addition to additional activities such as yoga class and volunteering.  Now, she is only able to walk about half a block before tiring out.       Today, she reports that she is at baseline -- comfortable at rest, but she has exertional fatigue.  EKGs/Labs/Other Studies Reviewed Today:     Echocardiogram:  TTE April 2025 EF 50 to 55%. mild concentric LVH.  Severe right atrial dilation, mild left atrial dilation.    EKG:   EKG Interpretation Date/Time:  Thursday June 26 2023 09:50:35 EDT Ventricular Rate:  104 PR Interval:    QRS Duration:  98 QT Interval:  380 QTC Calculation: 499 R Axis:   -53  Text Interpretation: Atrial fibrillation with rapid ventricular response Left anterior fascicular block Anteroseptal infarct (cited on or before 19-May-2023) When compared with ECG of 13-Jun-2023 09:15, No significant change was found Confirmed by Marlane Silver [phone]) on 06/26/2023 9:56:36 AM     Physical Exam:    VS:  BP 116/64   Pulse (!) 104   Ht 5' 3 (1.6 m)   Wt 113 lb 12.8 oz (51.6 kg)   SpO2 98%   BMI 20.16 kg/m     Wt Readings from Last 3 Encounters:  06/26/23 113 lb 12.8 oz (51.6 kg)  06/13/23 115 lb (52.2 kg)  05/19/23 110 lb (49.9 kg)     GEN:  Well nourished, well developed in no acute distress CARDIAC: irregular, rapid rhythm, no murmurs, rubs, gallops RESPIRATORY:  Normal work of breathing MUSCULOSKELETAL: no edema    ASSESSMENT & PLAN:     Permanent atrial fibrillation She has failed amiodarone  and is not a candidate for ablation at her advanced age We will continue rate control I strongly favor medical management over an invasive procedure, which would require pacemaker and AVJ ablation. Will place a monitor to assess current rate control  Secondary hypercoagulable state CHA2DS2-VASc score is 4 Continue apixaban  2.5 mg twice daily     Signed, Efraim Grange, MD  06/26/2023 10:17 AM    [facility]

## 2023-06-26 ENCOUNTER — Encounter: Payer: Self-pay | Admitting: Cardiovascular Disease

## 2023-06-26 ENCOUNTER — Ambulatory Visit

## 2023-06-26 ENCOUNTER — Ambulatory Visit: Attending: Cardiovascular Disease | Admitting: Cardiovascular Disease

## 2023-06-26 VITALS — BP 116/64 | HR 104 | Ht 63.0 in | Wt 113.8 lb

## 2023-06-26 DIAGNOSIS — I4819 Other persistent atrial fibrillation: Secondary | ICD-10-CM

## 2023-06-26 NOTE — Patient Instructions (Addendum)
 Medication Instructions:  Your physician recommends that you continue on your current medications as directed. Please refer to the Current Medication list given to you today. *If you need a refill on your cardiac medications before your next appointment, please call your pharmacy*   Testing/Procedures: Zio Cardiac Event Monitor - wear for 3 days   Follow-Up: At [facility], you and your health needs are our priority.  As part of our continuing mission to provide you with exceptional heart care, our providers are all part of one team.  This team includes your primary Cardiologist (physician) and Advanced Practice Providers or APPs (Physician Assistants and Nurse Practitioners) who all work together to provide you with the care you need, when you need it.  Your next appointment:   4 week(s)  Provider:   Marlane Silver, MD     Delane Fear- Long Term Monitor Instructions  Your physician has requested you wear a ZIO patch monitor for 3 days.  This is a single patch monitor. [employer] supplies one patch monitor per enrollment. Additional stickers are not available. Please do not apply patch if you will be having a Nuclear Stress Test,  Echocardiogram, Cardiac CT, MRI, or Chest Xray during the period you would be wearing the  monitor. The patch cannot be worn during these tests. You cannot remove and re-apply the  ZIO XT patch monitor.  Your ZIO patch monitor will be mailed 3 day USPS to your address on file. It may take 3-5 days  to receive your monitor after you have been enrolled.  Once you have received your monitor, please review the enclosed instructions. Your monitor  has already been registered assigning a specific monitor serial # to you.  Billing and Patient Assistance Program Information  We have supplied [employer] with any of your insurance information on file for billing purposes. [employer] offers a sliding scale Patient Assistance Program for patients that do not have   insurance, or whose insurance does not completely cover the cost of the ZIO monitor.  You must apply for the Patient Assistance Program to qualify for this discounted rate.  To apply, please call [employer] at [phone], select option 4, select option 2, ask to apply for  Patient Assistance Program. Sanna Crystal will ask your household income, and how many people  are in your household. They will quote your out-of-pocket cost based on that information.  [employer] will also be able to set up a 14-month, interest-free payment plan if needed.  Applying the monitor   Shave hair from upper left chest.  Hold abrader disc by orange tab. Rub abrader in 40 strokes over the upper left chest as  indicated in your monitor instructions.  Clean area with 4 enclosed alcohol  pads. Let dry.  Apply patch as indicated in monitor instructions. Patch will be placed under collarbone on left  side of chest with arrow pointing upward.  Rub patch adhesive wings for 2 minutes. Remove white label marked 1. Remove the white  label marked 2. Rub patch adhesive wings for 2 additional minutes.  While looking in a mirror, press and release button in center of patch. A small green light will  flash 3-4 times. This will be your only indicator that the monitor has been turned on.  Do not shower for the first 24 hours. You may shower after the first 24 hours.  Press the button if you feel a symptom. You will hear a small click. Record Date, Time and  Symptom in the Patient  Logbook.  When you are ready to remove the patch, follow instructions on the last 2 pages of Patient  Logbook. Stick patch monitor onto the last page of Patient Logbook.  Place Patient Logbook in the blue and white box. Use locking tab on box and tape box closed  securely. The blue and white box has prepaid postage on it. Please place it in the mailbox as  soon as possible. Your physician should have your test results approximately 7 days after the  monitor  has been mailed back to [facility].  Call [facility] Customer Care at [phone] if you have questions regarding  your ZIO XT patch monitor. Call them immediately if you see an orange light blinking on your  monitor.  If your monitor falls off in less than 4 days, contact our Monitor department at [phone].  If your monitor becomes loose or falls off after 4 days call [employer] at [phone] for  suggestions on securing your monitor

## 2023-06-26 NOTE — Progress Notes (Unsigned)
 Enrolled for [employer] to mail a ZIO XT long term holter monitor to the patients address on file.

## 2023-06-30 ENCOUNTER — Other Ambulatory Visit: Payer: Self-pay | Admitting: Cardiovascular Disease

## 2023-07-02 DIAGNOSIS — J310 Chronic rhinitis: Secondary | ICD-10-CM | POA: Diagnosis not present

## 2023-07-02 DIAGNOSIS — I4819 Other persistent atrial fibrillation: Secondary | ICD-10-CM | POA: Diagnosis not present

## 2023-07-02 DIAGNOSIS — I10 Essential (primary) hypertension: Secondary | ICD-10-CM | POA: Diagnosis not present

## 2023-07-02 DIAGNOSIS — Z8701 Personal history of pneumonia (recurrent): Secondary | ICD-10-CM | POA: Diagnosis not present

## 2023-07-02 DIAGNOSIS — R490 Dysphonia: Secondary | ICD-10-CM | POA: Diagnosis not present

## 2023-07-02 DIAGNOSIS — E039 Hypothyroidism, unspecified: Secondary | ICD-10-CM | POA: Diagnosis not present

## 2023-07-02 DIAGNOSIS — R600 Localized edema: Secondary | ICD-10-CM | POA: Diagnosis not present

## 2023-07-02 DIAGNOSIS — Z0000 Encounter for general adult medical examination without abnormal findings: Secondary | ICD-10-CM | POA: Diagnosis not present

## 2023-07-02 DIAGNOSIS — H938X1 Other specified disorders of right ear: Secondary | ICD-10-CM | POA: Diagnosis not present

## 2023-07-05 ENCOUNTER — Other Ambulatory Visit: Payer: Self-pay | Admitting: Cardiovascular Disease

## 2023-07-07 DIAGNOSIS — M79605 Pain in left leg: Secondary | ICD-10-CM | POA: Diagnosis not present

## 2023-07-07 DIAGNOSIS — I4819 Other persistent atrial fibrillation: Secondary | ICD-10-CM | POA: Diagnosis not present

## 2023-07-07 DIAGNOSIS — M79604 Pain in right leg: Secondary | ICD-10-CM | POA: Diagnosis not present

## 2023-07-07 DIAGNOSIS — I87392 Chronic venous hypertension (idiopathic) with other complications of left lower extremity: Secondary | ICD-10-CM | POA: Diagnosis not present

## 2023-07-07 DIAGNOSIS — M79662 Pain in left lower leg: Secondary | ICD-10-CM | POA: Diagnosis not present

## 2023-07-07 DIAGNOSIS — M79661 Pain in right lower leg: Secondary | ICD-10-CM | POA: Diagnosis not present

## 2023-07-10 DIAGNOSIS — J90 Pleural effusion, not elsewhere classified: Secondary | ICD-10-CM | POA: Diagnosis not present

## 2023-07-10 DIAGNOSIS — N1832 Chronic kidney disease, stage 3b: Secondary | ICD-10-CM | POA: Diagnosis not present

## 2023-07-10 DIAGNOSIS — I890 Lymphedema, not elsewhere classified: Secondary | ICD-10-CM | POA: Diagnosis not present

## 2023-07-10 DIAGNOSIS — R600 Localized edema: Secondary | ICD-10-CM | POA: Diagnosis not present

## 2023-07-15 ENCOUNTER — Other Ambulatory Visit: Payer: Self-pay | Admitting: Family Medicine

## 2023-07-15 DIAGNOSIS — R2242 Localized swelling, mass and lump, left lower limb: Secondary | ICD-10-CM

## 2023-07-16 ENCOUNTER — Ambulatory Visit: Payer: Self-pay | Admitting: *Deleted

## 2023-07-22 DIAGNOSIS — I4819 Other persistent atrial fibrillation: Secondary | ICD-10-CM | POA: Diagnosis not present

## 2023-07-23 ENCOUNTER — Ambulatory Visit
Admission: RE | Admit: 2023-07-23 | Discharge: 2023-07-23 | Disposition: A | Discharge status: Home / Self Care | Source: Ambulatory Visit | Attending: Family Medicine | Admitting: Family Medicine

## 2023-07-23 DIAGNOSIS — I700 Atherosclerosis of aorta: Secondary | ICD-10-CM | POA: Diagnosis not present

## 2023-07-23 DIAGNOSIS — R2242 Localized swelling, mass and lump, left lower limb: Secondary | ICD-10-CM

## 2023-07-23 DIAGNOSIS — M4316 Spondylolisthesis, lumbar region: Secondary | ICD-10-CM | POA: Diagnosis not present

## 2023-07-23 DIAGNOSIS — M51369 Other intervertebral disc degeneration, lumbar region without mention of lumbar back pain or lower extremity pain: Secondary | ICD-10-CM | POA: Diagnosis not present

## 2023-07-23 DIAGNOSIS — N281 Cyst of kidney, acquired: Secondary | ICD-10-CM | POA: Diagnosis not present

## 2023-07-23 MED ORDER — IOPAMIDOL (ISOVUE-370) INJECTION 76%
125.0000 mL | Freq: Once | INTRAVENOUS | Status: AC | PRN
  Administered 2023-07-23: 125 mL via INTRAVENOUS

## 2023-07-30 DIAGNOSIS — I480 Paroxysmal atrial fibrillation: Secondary | ICD-10-CM | POA: Diagnosis not present

## 2023-07-30 DIAGNOSIS — R202 Paresthesia of skin: Secondary | ICD-10-CM | POA: Diagnosis not present

## 2023-07-30 DIAGNOSIS — G629 Polyneuropathy, unspecified: Secondary | ICD-10-CM | POA: Diagnosis not present

## 2023-08-06 DIAGNOSIS — I890 Lymphedema, not elsewhere classified: Secondary | ICD-10-CM | POA: Diagnosis not present

## 2023-08-07 ENCOUNTER — Encounter: Payer: Self-pay | Admitting: Cardiovascular Disease

## 2023-08-07 ENCOUNTER — Ambulatory Visit: Attending: Cardiovascular Disease | Admitting: Cardiovascular Disease

## 2023-08-07 VITALS — BP 114/78 | HR 127 | Ht 63.0 in | Wt 113.5 lb

## 2023-08-07 DIAGNOSIS — I4719 Other supraventricular tachycardia: Secondary | ICD-10-CM | POA: Diagnosis not present

## 2023-08-07 DIAGNOSIS — I4819 Other persistent atrial fibrillation: Secondary | ICD-10-CM

## 2023-08-07 MED ORDER — DILTIAZEM HCL 30 MG PO TABS: 30.0000 mg | ORAL_TABLET | ORAL | 2 refills | Status: DC | PRN

## 2023-08-07 NOTE — Patient Instructions (Addendum)
 Medication Instructions:  Your physician has recommended you make the following change in your medication:  Start diltiazem  30mg  as needed for fast heart rate.  Lab Work: None ordered.  You may go to any [facility] Location for your lab work:  [facility] - [address] ([facility]) - [address] [phone] [address]  [place] - [address]   [place]  - [address]   [place] - [address] - [address] [employer]  - [address] ([facility]   If you have labs (blood work) drawn today and your tests are completely normal, you will receive your results only by: [employer] (if you have MyChart)  If you have any lab test that is abnormal or we need to change your treatment, we will call you or send a MyChart message to review the results.  Testing/Procedures: None ordered.  Follow-Up: At [facility], you and your health needs are our priority.  As part of our continuing mission to provide you with exceptional heart care, we have created designated Provider Care Teams.  These Care Teams include your primary Cardiologist (physician) and Advanced Practice Providers (APPs -  Physician Assistants and Nurse Practitioners) who all work together to provide you with the care you need, when you need it.  Your next appointment:   1 year(s)  The format for your next appointment:   In Person  Provider:   Dr Barbaraann

## 2023-08-07 NOTE — Progress Notes (Signed)
 Electrophysiology Office Note:    Date:  08/07/2023   ID:  Kathleen Santana, DOB 08-Jul-1926, MRN [id number]  PCP:  Clarice Nottingham, MD   [facility] Providers Cardiologist:  Darryle ONEIDA Decent, MD Electrophysiologist:  Eulas FORBES Furbish, MD     Referring MD: Job Browning, PA-C   History of Present Illness:    Kathleen Santana is a 89 y.o. female with a medical history significant for persistent atrial fibrillation, hypothyroidism referred for management of atrial fibrillation.     She underwent DC cardioversion in March 2025 but quickly reverted to atrial fibrillation.  She was admitted in April for pneumonia, still in atrial fibrillation.  Amiodarone  was started, and she underwent DC cardioversion in May 2025.  She returned in follow-up to see Dr. Decent a few days later and was noted to be back in atrial fibrillation.  Amiodarone  was discontinued.  She reports that her heart rates at home have been about 80 bpm to 104 bpm.  She has had a significant decline in functional capacity over the past several months.  She walked about 3 miles daily in addition to additional activities such as yoga class and volunteering.  Now, she is only able to walk about half a block before tiring out.       Today, she reports that she is at baseline -- comfortable at rest, but she has exertional fatigue.  EKGs/Labs/Other Studies Reviewed Today:     Echocardiogram:  TTE April 2025 EF 50 to 55%. mild concentric LVH.  Severe right atrial dilation, mild left atrial dilation.  Monitor: July 22, 2023 Predominant rhythm atrial flutter and fibrillation.  Average heart rate 98 bpm, range 59 to 135 bpm.  Patient triggered events include an episode of shortness of breath correlating with ventricular rates of 104 to 130 bpm.  EKG:   EKG Interpretation Date/Time:  Thursday August 07 2023 11:21:44 EDT Ventricular Rate:  127 PR Interval:    QRS Duration:  90 QT Interval:  344 QTC  Calculation: 499 R Axis:   -59  Text Interpretation: Atrial fibrillation with rapid ventricular response Left axis deviation Low voltage QRS Septal infarct (cited on or before 19-May-2023) When compared with ECG of 26-Jun-2023 09:50, No significant change was found Confirmed by Furbish Eulas [phone]) on 08/07/2023 11:43:37 AM     Physical Exam:    VS:  BP 114/78 (BP Location: Right Arm, Patient Position: Sitting, Cuff Size: Normal)   Pulse (!) 127   Ht 5' 3 (1.6 m)   Wt 113 lb 8 oz (51.5 kg)   SpO2 98%   BMI 20.11 kg/m     Wt Readings from Last 3 Encounters:  08/07/23 113 lb 8 oz (51.5 kg)  06/26/23 113 lb 12.8 oz (51.6 kg)  06/13/23 115 lb (52.2 kg)     GEN:  Well nourished, well developed in no acute distress CARDIAC: irregular, rapid rhythm, no murmurs, rubs, gallops RESPIRATORY:  Normal work of breathing MUSCULOSKELETAL: no edema    ASSESSMENT & PLAN:     Permanent atrial fibrillation She has failed amiodarone  and is not a candidate for ablation at her advanced age We will continue rate control I strongly favor medical management over an invasive procedure, which would require pacemaker and AVJ ablation. Rates are controlled on current dose of diltiazem  with average V rates of 98 bpm She symptoms has symptoms associate with high rates, so I think it reasonable to have diltiazem  30 mg short acting as needed I think  we level to manage her rates medically, so I will refer her back to Dr. Vernice for medical management of permanent atrial fibrillation. I will of course be happy to see her again should any future issues arise.  Secondary hypercoagulable state CHA2DS2-VASc score is 4 Continue apixaban  2.5 mg twice daily     Signed, Eulas FORBES Furbish, MD  08/07/2023 11:44 AM    [facility]

## 2023-08-18 DIAGNOSIS — I10 Essential (primary) hypertension: Secondary | ICD-10-CM | POA: Diagnosis not present

## 2023-08-21 DIAGNOSIS — G5711 Meralgia paresthetica, right lower limb: Secondary | ICD-10-CM | POA: Diagnosis not present

## 2023-08-21 DIAGNOSIS — D6869 Other thrombophilia: Secondary | ICD-10-CM | POA: Diagnosis not present

## 2023-08-21 DIAGNOSIS — I509 Heart failure, unspecified: Secondary | ICD-10-CM | POA: Diagnosis not present

## 2023-08-21 DIAGNOSIS — Z0000 Encounter for general adult medical examination without abnormal findings: Secondary | ICD-10-CM | POA: Diagnosis not present

## 2023-08-21 DIAGNOSIS — N184 Chronic kidney disease, stage 4 (severe): Secondary | ICD-10-CM | POA: Diagnosis not present

## 2023-08-21 DIAGNOSIS — R053 Chronic cough: Secondary | ICD-10-CM | POA: Diagnosis not present

## 2023-08-21 DIAGNOSIS — I890 Lymphedema, not elsewhere classified: Secondary | ICD-10-CM | POA: Diagnosis not present

## 2023-08-21 DIAGNOSIS — J90 Pleural effusion, not elsewhere classified: Secondary | ICD-10-CM | POA: Diagnosis not present

## 2023-08-21 DIAGNOSIS — I10 Essential (primary) hypertension: Secondary | ICD-10-CM | POA: Diagnosis not present

## 2023-08-21 DIAGNOSIS — G629 Polyneuropathy, unspecified: Secondary | ICD-10-CM | POA: Diagnosis not present

## 2023-08-21 DIAGNOSIS — I4819 Other persistent atrial fibrillation: Secondary | ICD-10-CM | POA: Diagnosis not present

## 2023-08-21 DIAGNOSIS — E039 Hypothyroidism, unspecified: Secondary | ICD-10-CM | POA: Diagnosis not present

## 2023-08-27 ENCOUNTER — Other Ambulatory Visit: Payer: Self-pay

## 2023-08-27 ENCOUNTER — Encounter (HOSPITAL_COMMUNITY): Payer: Self-pay

## 2023-08-27 ENCOUNTER — Inpatient Hospital Stay (HOSPITAL_COMMUNITY)
Admission: EM | Admit: 2023-08-27 | Discharge: 2023-08-30 | DRG: 193 | Disposition: A | Discharge status: Home Health | Attending: Internal Medicine | Admitting: Internal Medicine

## 2023-08-27 ENCOUNTER — Emergency Department (HOSPITAL_COMMUNITY)

## 2023-08-27 DIAGNOSIS — E871 Hypo-osmolality and hyponatremia: Secondary | ICD-10-CM | POA: Diagnosis present

## 2023-08-27 DIAGNOSIS — Z886 Allergy status to analgesic agent status: Secondary | ICD-10-CM

## 2023-08-27 DIAGNOSIS — D631 Anemia in chronic kidney disease: Secondary | ICD-10-CM | POA: Diagnosis present

## 2023-08-27 DIAGNOSIS — N179 Acute kidney failure, unspecified: Secondary | ICD-10-CM | POA: Diagnosis present

## 2023-08-27 DIAGNOSIS — Z9109 Other allergy status, other than to drugs and biological substances: Secondary | ICD-10-CM

## 2023-08-27 DIAGNOSIS — Z885 Allergy status to narcotic agent status: Secondary | ICD-10-CM

## 2023-08-27 DIAGNOSIS — I5033 Acute on chronic diastolic (congestive) heart failure: Secondary | ICD-10-CM | POA: Diagnosis present

## 2023-08-27 DIAGNOSIS — I071 Rheumatic tricuspid insufficiency: Secondary | ICD-10-CM | POA: Diagnosis present

## 2023-08-27 DIAGNOSIS — Z1152 Encounter for screening for COVID-19: Secondary | ICD-10-CM

## 2023-08-27 DIAGNOSIS — J189 Pneumonia, unspecified organism: Principal | ICD-10-CM | POA: Diagnosis present

## 2023-08-27 DIAGNOSIS — Z79890 Hormone replacement therapy (postmenopausal): Secondary | ICD-10-CM

## 2023-08-27 DIAGNOSIS — I480 Paroxysmal atrial fibrillation: Secondary | ICD-10-CM | POA: Diagnosis present

## 2023-08-27 DIAGNOSIS — I130 Hypertensive heart and chronic kidney disease with heart failure and stage 1 through stage 4 chronic kidney disease, or unspecified chronic kidney disease: Secondary | ICD-10-CM | POA: Diagnosis present

## 2023-08-27 DIAGNOSIS — J9601 Acute respiratory failure with hypoxia: Principal | ICD-10-CM | POA: Diagnosis present

## 2023-08-27 DIAGNOSIS — Z8249 Family history of ischemic heart disease and other diseases of the circulatory system: Secondary | ICD-10-CM

## 2023-08-27 DIAGNOSIS — D649 Anemia, unspecified: Secondary | ICD-10-CM | POA: Insufficient documentation

## 2023-08-27 DIAGNOSIS — Z8616 Personal history of COVID-19: Secondary | ICD-10-CM

## 2023-08-27 DIAGNOSIS — I482 Chronic atrial fibrillation, unspecified: Secondary | ICD-10-CM | POA: Diagnosis present

## 2023-08-27 DIAGNOSIS — N183 Chronic kidney disease, stage 3 unspecified: Secondary | ICD-10-CM | POA: Diagnosis present

## 2023-08-27 DIAGNOSIS — E039 Hypothyroidism, unspecified: Secondary | ICD-10-CM | POA: Diagnosis present

## 2023-08-27 DIAGNOSIS — R059 Cough, unspecified: Secondary | ICD-10-CM | POA: Diagnosis not present

## 2023-08-27 DIAGNOSIS — I517 Cardiomegaly: Secondary | ICD-10-CM | POA: Diagnosis not present

## 2023-08-27 DIAGNOSIS — R918 Other nonspecific abnormal finding of lung field: Secondary | ICD-10-CM | POA: Diagnosis not present

## 2023-08-27 DIAGNOSIS — I10 Essential (primary) hypertension: Secondary | ICD-10-CM | POA: Diagnosis present

## 2023-08-27 DIAGNOSIS — E861 Hypovolemia: Secondary | ICD-10-CM | POA: Diagnosis present

## 2023-08-27 DIAGNOSIS — N1831 Chronic kidney disease, stage 3a: Secondary | ICD-10-CM | POA: Diagnosis present

## 2023-08-27 DIAGNOSIS — M810 Age-related osteoporosis without current pathological fracture: Secondary | ICD-10-CM | POA: Diagnosis present

## 2023-08-27 DIAGNOSIS — Z7901 Long term (current) use of anticoagulants: Secondary | ICD-10-CM

## 2023-08-27 DIAGNOSIS — Z87891 Personal history of nicotine dependence: Secondary | ICD-10-CM

## 2023-08-27 DIAGNOSIS — Z79899 Other long term (current) drug therapy: Secondary | ICD-10-CM

## 2023-08-27 DIAGNOSIS — I5031 Acute diastolic (congestive) heart failure: Secondary | ICD-10-CM | POA: Diagnosis present

## 2023-08-27 DIAGNOSIS — R0602 Shortness of breath: Secondary | ICD-10-CM | POA: Diagnosis not present

## 2023-08-27 DIAGNOSIS — J90 Pleural effusion, not elsewhere classified: Secondary | ICD-10-CM | POA: Diagnosis not present

## 2023-08-27 DIAGNOSIS — R062 Wheezing: Secondary | ICD-10-CM | POA: Diagnosis not present

## 2023-08-27 LAB — CBC WITH DIFFERENTIAL/PLATELET
Abs Immature Granulocytes: 0.04 K/uL (ref 0.00–0.07)
Basophils Absolute: 0 K/uL (ref 0.0–0.1)
Basophils Relative: 0 %
Eosinophils Absolute: 0 K/uL (ref 0.0–0.5)
Eosinophils Relative: 0 %
HCT: 34.3 % — ABNORMAL LOW (ref 36.0–46.0)
Hemoglobin: 11.3 g/dL — ABNORMAL LOW (ref 12.0–15.0)
Immature Granulocytes: 0 %
Lymphocytes Relative: 10 %
Lymphs Abs: 1 K/uL (ref 0.7–4.0)
MCH: 29.8 pg (ref 26.0–34.0)
MCHC: 32.9 g/dL (ref 30.0–36.0)
MCV: 90.5 fL (ref 80.0–100.0)
Monocytes Absolute: 0.4 K/uL (ref 0.1–1.0)
Monocytes Relative: 4 %
Neutro Abs: 8 K/uL — ABNORMAL HIGH (ref 1.7–7.7)
Neutrophils Relative %: 86 %
Platelets: 238 K/uL (ref 150–400)
RBC: 3.79 MIL/uL — ABNORMAL LOW (ref 3.87–5.11)
RDW: 16.6 % — ABNORMAL HIGH (ref 11.5–15.5)
WBC: 9.5 K/uL (ref 4.0–10.5)
nRBC: 0 % (ref 0.0–0.2)

## 2023-08-27 NOTE — ED Provider Notes (Signed)
 Emergency Department Provider Note   I have reviewed the triage vital signs and the nursing notes.   HISTORY  Chief Complaint Shortness of Breath   HPI Kathleen Santana is a 89 y.o. female with past medical history reviewed below presents emergency department with acute onset shortness of breath with rhonchi in the lower lung fields.  Patient states she was feeling well until this afternoon she began to have much more shortness of breath.  She has baseline leg swelling which is not worse.  No fevers.  Denies any obvious aspiration event such as vomiting.  EMS on scene appreciated rhonchi with some associated wheezing and gave 2 DuoNebs prior to arrival.  She was placed on 2 L nasal cannula as well.  She is feeling more comfortable but not fully back to baseline.   Past Medical History:  Diagnosis Date   Afib [facility]    Arrhythmia    COVID-19    2022   GERD (gastroesophageal reflux disease)    Hypertension    Hypothyroidism    Lower extremity neuropathy    Osteoporosis    Pneumonia    2021   Rhinitis, non-allergic     Review of Systems  Constitutional: No fever/chills Cardiovascular: Denies chest pain. Respiratory: Positive shortness of breath. Gastrointestinal: No abdominal pain.  No nausea, no vomiting.   Skin: Negative for rash. Neurological: Negative for headaches.  ____________________________________________   PHYSICAL EXAM:  VITAL SIGNS: ED Triage Vitals  Encounter Vitals Group     BP 08/27/23 2236 133/64     Pulse Rate 08/27/23 2236 (!) 51     Resp 08/27/23 2236 20     Temp 08/27/23 2236 98 F (36.7 C)     Temp Source 08/27/23 2236 Oral     SpO2 08/27/23 2236 96 %     Weight 08/27/23 2242 113 lb 8.6 oz (51.5 kg)     Height 08/27/23 2242 5' 3 (1.6 m)   Constitutional: Alert and oriented. Well appearing and in no acute distress. Eyes: Conjunctivae are normal. Head: Atraumatic. Nose: No congestion/rhinnorhea. Mouth/Throat: Mucous membranes are  moist.  Neck: No stridor.   Cardiovascular: Normal rate, regular rhythm. Good peripheral circulation. Grossly normal heart sounds.   Respiratory: Normal respiratory effort.  No retractions. Lungs with rhonchi at the bases bilaterally. Gastrointestinal: Soft and nontender. No distention.  Musculoskeletal: No lower extremity tenderness nor edema. No gross deformities of extremities. Neurologic:  Normal speech and language. No gross focal neurologic deficits are appreciated.  Skin:  Skin is warm, dry and intact. No rash noted.   ____________________________________________   LABS (all labs ordered are listed, but only abnormal results are displayed)  Labs Reviewed  BASIC METABOLIC PANEL WITH GFR - Abnormal; Notable for the following components:      Result Value   Sodium 132 (*)    CO2 19 (*)    Glucose, Bld 164 (*)    BUN 30 (*)    Creatinine, Ser 1.89 (*)    GFR, Estimated 24 (*)    All other components within normal limits  CBC WITH DIFFERENTIAL/PLATELET - Abnormal; Notable for the following components:   RBC 3.79 (*)    Hemoglobin 11.3 (*)    HCT 34.3 (*)    RDW 16.6 (*)    Neutro Abs 8.0 (*)    All other components within normal limits  BRAIN NATRIURETIC PEPTIDE - Abnormal; Notable for the following components:   B Natriuretic Peptide 181.2 (*)    All  other components within normal limits  RESP PANEL BY RT-PCR (RSV, FLU A&B, COVID)  RVPGX2   ____________________________________________  EKG  A fib without RVR. No STEMI.   _______________  RADIOLOGY  DG Chest 2 View Result Date: 08/27/2023 CLINICAL DATA:  Shortness of breath EXAM: CHEST - 2 VIEW COMPARISON:  Chest x-ray 05/09/2023 FINDINGS: The heart is enlarged. There are atherosclerotic calcifications of the aorta. There is a small right pleural effusion. There are minimal patchy opacities in the anterior left upper lobe. The lungs are otherwise clear. There is no pneumothorax or acute fracture. IMPRESSION: 1.  Cardiomegaly. 2. Small right pleural effusion. 3. Minimal patchy opacities in the anterior left upper lobe, atelectasis versus infection. Electronically Signed   By: Greig Pique M.D.   On: 08/27/2023 23:36    ____________________________________________   PROCEDURES  Procedure(s) performed:   Procedures  CRITICAL CARE Performed by: Fonda KANDICE Law Total critical care time: 35 minutes Critical care time was exclusive of separately billable procedures and treating other patients. Critical care was necessary to treat or prevent imminent or life-threatening deterioration. Critical care was time spent personally by me on the following activities: development of treatment plan with patient and/or surrogate as well as nursing, discussions with consultants, evaluation of patient's response to treatment, examination of patient, obtaining history from patient or surrogate, ordering and performing treatments and interventions, ordering and review of laboratory studies, ordering and review of radiographic studies, pulse oximetry and re-evaluation of patient's condition.  Fonda Law, MD Emergency Medicine  ____________________________________________   INITIAL IMPRESSION / ASSESSMENT AND PLAN / ED COURSE  Pertinent labs & imaging results that were available during my care of the patient were reviewed by me and considered in my medical decision making (see chart for details).   This patient is Presenting for Evaluation of SOB, which does require a range of treatment options, and is a complaint that involves a high risk of morbidity and mortality.  The Differential Diagnoses include Aspiration PNA, CAP, CHF, valve dysfunction, etc.  Critical Interventions-    Medications  azithromycin  (ZITHROMAX ) 500 mg in sodium chloride  0.9 % 250 mL IVPB (500 mg Intravenous New Bag/Given 08/28/23 0113)  cefTRIAXone  (ROCEPHIN ) 1 g in sodium chloride  0.9 % 100 mL IVPB (0 g Intravenous Stopped 08/28/23 0149)   furosemide  (LASIX ) injection 20 mg (20 mg Intravenous Given 08/28/23 0109)    Reassessment after intervention: symptoms remain mild.    I did obtain Additional Historical Information from family at bedside.  I decided to review pertinent External Data, and in summary admit in April 2025 with similar symptoms. Differential at that time included volume overload and bronchitis.   Clinical Laboratory Tests Ordered, included BNP mildly elevated to 181. Creatinine mildly elevated. No severe anemia.   Radiologic Tests Ordered, included CXR. I independently interpreted the images and agree with radiology interpretation.   Cardiac Monitor Tracing which shows A fib.    Social Determinants of Health Risk patient is a non-smoker.   Consult complete with [facility], Dr. Franky. Plan for admit.   Medical Decision Making: Summary:  Patient presents emergency department shortness of breath.  She has rhonchi on exam and new O2 requirement.  Looking improved after 2 DuoNeb's with EMS.  Afebrile.  No SIRS vitals.  Doubt sepsis at this time.  With rhonchi my concern for bronchitis or developing infection is elevated.  Considered PE but patient is on Eliquis .   Reevaluation with update and discussion with patient and family. Updated on labs and  imaging. Plan for admit.   Patient's presentation is most consistent with acute presentation with potential threat to life or bodily function.   Disposition: admit  ____________________________________________  FINAL CLINICAL IMPRESSION(S) / ED DIAGNOSES  Final diagnoses:  Acute respiratory failure with hypoxia (HCC)    Note:  This document was prepared using Dragon voice recognition software and may include unintentional dictation errors.  Fonda Law, MD, [facility] Emergency Medicine    Thressa Shiffer, Fonda MATSU, MD 08/28/23 [phone]

## 2023-08-27 NOTE — ED Triage Notes (Signed)
 PT BIB [facility] with a c/o SOB. Wheezing and Rhonchi heard all over and lower left lung sounds decreased. PT has a hx of pneumonia in April. EMS gave 2 duonebs and 2L O2 .which helped with the wheezing, now only rales are heard.

## 2023-08-28 ENCOUNTER — Observation Stay (HOSPITAL_COMMUNITY)

## 2023-08-28 ENCOUNTER — Encounter (HOSPITAL_COMMUNITY): Payer: Self-pay | Admitting: Internal Medicine

## 2023-08-28 DIAGNOSIS — Z87891 Personal history of nicotine dependence: Secondary | ICD-10-CM | POA: Diagnosis not present

## 2023-08-28 DIAGNOSIS — J189 Pneumonia, unspecified organism: Secondary | ICD-10-CM | POA: Diagnosis not present

## 2023-08-28 DIAGNOSIS — J90 Pleural effusion, not elsewhere classified: Secondary | ICD-10-CM | POA: Diagnosis not present

## 2023-08-28 DIAGNOSIS — J9601 Acute respiratory failure with hypoxia: Principal | ICD-10-CM | POA: Diagnosis present

## 2023-08-28 DIAGNOSIS — Z886 Allergy status to analgesic agent status: Secondary | ICD-10-CM | POA: Diagnosis not present

## 2023-08-28 DIAGNOSIS — I5031 Acute diastolic (congestive) heart failure: Secondary | ICD-10-CM | POA: Diagnosis not present

## 2023-08-28 DIAGNOSIS — D631 Anemia in chronic kidney disease: Secondary | ICD-10-CM | POA: Diagnosis not present

## 2023-08-28 DIAGNOSIS — I482 Chronic atrial fibrillation, unspecified: Secondary | ICD-10-CM | POA: Diagnosis not present

## 2023-08-28 DIAGNOSIS — I071 Rheumatic tricuspid insufficiency: Secondary | ICD-10-CM | POA: Diagnosis not present

## 2023-08-28 DIAGNOSIS — Z9109 Other allergy status, other than to drugs and biological substances: Secondary | ICD-10-CM | POA: Diagnosis not present

## 2023-08-28 DIAGNOSIS — E871 Hypo-osmolality and hyponatremia: Secondary | ICD-10-CM | POA: Diagnosis not present

## 2023-08-28 DIAGNOSIS — I480 Paroxysmal atrial fibrillation: Secondary | ICD-10-CM | POA: Diagnosis not present

## 2023-08-28 DIAGNOSIS — Z8249 Family history of ischemic heart disease and other diseases of the circulatory system: Secondary | ICD-10-CM | POA: Diagnosis not present

## 2023-08-28 DIAGNOSIS — E861 Hypovolemia: Secondary | ICD-10-CM | POA: Diagnosis not present

## 2023-08-28 DIAGNOSIS — Z8616 Personal history of COVID-19: Secondary | ICD-10-CM | POA: Diagnosis not present

## 2023-08-28 DIAGNOSIS — J449 Chronic obstructive pulmonary disease, unspecified: Secondary | ICD-10-CM | POA: Diagnosis not present

## 2023-08-28 DIAGNOSIS — D649 Anemia, unspecified: Secondary | ICD-10-CM | POA: Insufficient documentation

## 2023-08-28 DIAGNOSIS — Z7901 Long term (current) use of anticoagulants: Secondary | ICD-10-CM | POA: Diagnosis not present

## 2023-08-28 DIAGNOSIS — Z79899 Other long term (current) drug therapy: Secondary | ICD-10-CM | POA: Diagnosis not present

## 2023-08-28 DIAGNOSIS — M810 Age-related osteoporosis without current pathological fracture: Secondary | ICD-10-CM | POA: Diagnosis not present

## 2023-08-28 DIAGNOSIS — N1832 Chronic kidney disease, stage 3b: Secondary | ICD-10-CM | POA: Diagnosis not present

## 2023-08-28 DIAGNOSIS — I5033 Acute on chronic diastolic (congestive) heart failure: Secondary | ICD-10-CM | POA: Diagnosis not present

## 2023-08-28 DIAGNOSIS — Z885 Allergy status to narcotic agent status: Secondary | ICD-10-CM | POA: Diagnosis not present

## 2023-08-28 DIAGNOSIS — Z79890 Hormone replacement therapy (postmenopausal): Secondary | ICD-10-CM | POA: Diagnosis not present

## 2023-08-28 DIAGNOSIS — N1831 Chronic kidney disease, stage 3a: Secondary | ICD-10-CM | POA: Diagnosis not present

## 2023-08-28 DIAGNOSIS — N179 Acute kidney failure, unspecified: Secondary | ICD-10-CM | POA: Diagnosis not present

## 2023-08-28 DIAGNOSIS — E039 Hypothyroidism, unspecified: Secondary | ICD-10-CM | POA: Diagnosis not present

## 2023-08-28 DIAGNOSIS — I130 Hypertensive heart and chronic kidney disease with heart failure and stage 1 through stage 4 chronic kidney disease, or unspecified chronic kidney disease: Secondary | ICD-10-CM | POA: Diagnosis not present

## 2023-08-28 DIAGNOSIS — R918 Other nonspecific abnormal finding of lung field: Secondary | ICD-10-CM | POA: Diagnosis not present

## 2023-08-28 DIAGNOSIS — Z1152 Encounter for screening for COVID-19: Secondary | ICD-10-CM | POA: Diagnosis not present

## 2023-08-28 LAB — RESP PANEL BY RT-PCR (RSV, FLU A&B, COVID)  RVPGX2
Influenza A by PCR: NEGATIVE
Influenza B by PCR: NEGATIVE
Resp Syncytial Virus by PCR: NEGATIVE
SARS Coronavirus 2 by RT PCR: NEGATIVE

## 2023-08-28 LAB — BRAIN NATRIURETIC PEPTIDE: B Natriuretic Peptide: 181.2 pg/mL — ABNORMAL HIGH (ref 0.0–100.0)

## 2023-08-28 LAB — BASIC METABOLIC PANEL WITH GFR
Anion gap: 12 (ref 5–15)
Anion gap: 10 (ref 5–15)
BUN: 30 mg/dL — ABNORMAL HIGH (ref 8–23)
BUN: 28 mg/dL — ABNORMAL HIGH (ref 8–23)
CO2: 19 mmol/L — ABNORMAL LOW (ref 22–32)
CO2: 21 mmol/L — ABNORMAL LOW (ref 22–32)
Calcium: 8.9 mg/dL (ref 8.9–10.3)
Calcium: 8.5 mg/dL — ABNORMAL LOW (ref 8.9–10.3)
Chloride: 101 mmol/L (ref 98–111)
Chloride: 102 mmol/L (ref 98–111)
Creatinine, Ser: 1.89 mg/dL — ABNORMAL HIGH (ref 0.44–1.00)
Creatinine, Ser: 2 mg/dL — ABNORMAL HIGH (ref 0.44–1.00)
GFR, Estimated: 24 mL/min — ABNORMAL LOW (ref >60)
GFR, Estimated: 23 mL/min — ABNORMAL LOW (ref >60)
Glucose, Bld: 164 mg/dL — ABNORMAL HIGH (ref 70–99)
Glucose, Bld: 173 mg/dL — ABNORMAL HIGH (ref 70–99)
Potassium: 4.2 mmol/L (ref 3.5–5.1)
Potassium: 4.1 mmol/L (ref 3.5–5.1)
Sodium: 132 mmol/L — ABNORMAL LOW (ref 135–145)
Sodium: 133 mmol/L — ABNORMAL LOW (ref 135–145)

## 2023-08-28 LAB — CBC WITH DIFFERENTIAL/PLATELET
Abs Immature Granulocytes: 0.03 K/uL (ref 0.00–0.07)
Basophils Absolute: 0 K/uL (ref 0.0–0.1)
Basophils Relative: 0 %
Eosinophils Absolute: 0 K/uL (ref 0.0–0.5)
Eosinophils Relative: 0 %
HCT: 32.3 % — ABNORMAL LOW (ref 36.0–46.0)
Hemoglobin: 10.5 g/dL — ABNORMAL LOW (ref 12.0–15.0)
Immature Granulocytes: 0 %
Lymphocytes Relative: 7 %
Lymphs Abs: 0.7 K/uL (ref 0.7–4.0)
MCH: 29.3 pg (ref 26.0–34.0)
MCHC: 32.5 g/dL (ref 30.0–36.0)
MCV: 90.2 fL (ref 80.0–100.0)
Monocytes Absolute: 0.6 K/uL (ref 0.1–1.0)
Monocytes Relative: 6 %
Neutro Abs: 8.4 K/uL — ABNORMAL HIGH (ref 1.7–7.7)
Neutrophils Relative %: 87 %
Platelets: 249 K/uL (ref 150–400)
RBC: 3.58 MIL/uL — ABNORMAL LOW (ref 3.87–5.11)
RDW: 16.5 % — ABNORMAL HIGH (ref 11.5–15.5)
WBC: 9.8 K/uL (ref 4.0–10.5)
nRBC: 0 % (ref 0.0–0.2)

## 2023-08-28 LAB — PROCALCITONIN: Procalcitonin: <0.1 ng/mL

## 2023-08-28 LAB — URINALYSIS, ROUTINE W REFLEX MICROSCOPIC
Bilirubin Urine: NEGATIVE
Glucose, UA: NEGATIVE mg/dL
Hgb urine dipstick: NEGATIVE
Ketones, ur: NEGATIVE mg/dL
Leukocytes,Ua: NEGATIVE
Nitrite: NEGATIVE
Protein, ur: NEGATIVE mg/dL
Specific Gravity, Urine: 1.008 (ref 1.005–1.030)
pH: 5 (ref 5.0–8.0)

## 2023-08-28 LAB — MAGNESIUM: Magnesium: 2.2 mg/dL (ref 1.7–2.4)

## 2023-08-28 LAB — HEPATIC FUNCTION PANEL
ALT: 58 U/L — ABNORMAL HIGH (ref 0–44)
AST: 85 U/L — ABNORMAL HIGH (ref 15–41)
Albumin: 3.3 g/dL — ABNORMAL LOW (ref 3.5–5.0)
Alkaline Phosphatase: 102 U/L (ref 38–126)
Bilirubin, Direct: <0.1 mg/dL (ref 0.0–0.2)
Total Bilirubin: 0.5 mg/dL (ref 0.0–1.2)
Total Protein: 7.3 g/dL (ref 6.5–8.1)

## 2023-08-28 LAB — FOLATE: Folate: 28.3 ng/mL (ref >5.9)

## 2023-08-28 LAB — TROPONIN I (HIGH SENSITIVITY): Troponin I (High Sensitivity): 6 ng/L (ref <18)

## 2023-08-28 LAB — VITAMIN B12: Vitamin B-12: 1798 pg/mL — ABNORMAL HIGH (ref 180–914)

## 2023-08-28 LAB — TSH: TSH: 2.633 u[IU]/mL (ref 0.350–4.500)

## 2023-08-28 MED ORDER — MAGNESIUM GLUCONATE 500 (27 MG) MG PO TABS
500.0000 mg | ORAL_TABLET | Freq: Every day | ORAL | Status: DC
Start: 2023-08-28 — End: 2023-08-30
  Administered 2023-08-28 – 2023-08-30 (×3): 500 mg via ORAL
  Filled 2023-08-28 (×3): qty 1

## 2023-08-28 MED ORDER — SODIUM CHLORIDE 0.9 % IV SOLN: INTRAVENOUS | Status: DC

## 2023-08-28 MED ORDER — LEVOTHYROXINE SODIUM 50 MCG PO TABS
50.0000 ug | ORAL_TABLET | Freq: Every day | ORAL | Status: DC
  Administered 2023-08-28 – 2023-08-30 (×3): 50 ug via ORAL
  Filled 2023-08-28: qty 2
  Filled 2023-08-28 (×2): qty 1

## 2023-08-28 MED ORDER — FUROSEMIDE 10 MG/ML IJ SOLN
20.0000 mg | Freq: Once | INTRAMUSCULAR | Status: AC
  Administered 2023-08-28: 20 mg via INTRAVENOUS
  Filled 2023-08-28: qty 2

## 2023-08-28 MED ORDER — SODIUM CHLORIDE 0.9 % IV SOLN
500.0000 mg | INTRAVENOUS | Status: DC
  Administered 2023-08-28: 500 mg via INTRAVENOUS
  Filled 2023-08-28: qty 5

## 2023-08-28 MED ORDER — SODIUM CHLORIDE 0.9 % IV SOLN
1.0000 g | Freq: Once | INTRAVENOUS | Status: AC
  Administered 2023-08-28: 1 g via INTRAVENOUS
  Filled 2023-08-28: qty 10

## 2023-08-28 MED ORDER — APIXABAN 2.5 MG PO TABS
2.5000 mg | ORAL_TABLET | Freq: Two times a day (BID) | ORAL | Status: DC
  Administered 2023-08-28 – 2023-08-30 (×5): 2.5 mg via ORAL
  Filled 2023-08-28 (×5): qty 1

## 2023-08-28 MED ORDER — GABAPENTIN 300 MG PO CAPS
300.0000 mg | ORAL_CAPSULE | Freq: Every day | ORAL | Status: DC
Start: 2023-08-28 — End: 2023-08-30
  Administered 2023-08-28 – 2023-08-30 (×3): 300 mg via ORAL
  Filled 2023-08-28 (×3): qty 1

## 2023-08-28 MED ORDER — LEVALBUTEROL HCL 0.63 MG/3ML IN NEBU
0.6300 mg | INHALATION_SOLUTION | Freq: Four times a day (QID) | RESPIRATORY_TRACT | Status: AC
  Administered 2023-08-28 – 2023-08-29 (×5): 0.63 mg via RESPIRATORY_TRACT
  Filled 2023-08-28 (×6): qty 3

## 2023-08-28 MED ORDER — SODIUM CHLORIDE 0.9 % IV SOLN
1.0000 g | INTRAVENOUS | Status: DC
  Administered 2023-08-28: 1 g via INTRAVENOUS
  Filled 2023-08-28: qty 10

## 2023-08-28 MED ORDER — PANTOPRAZOLE SODIUM 40 MG PO TBEC
40.0000 mg | DELAYED_RELEASE_TABLET | Freq: Two times a day (BID) | ORAL | Status: DC
  Administered 2023-08-28 – 2023-08-30 (×5): 40 mg via ORAL
  Filled 2023-08-28 (×5): qty 1

## 2023-08-28 MED ORDER — SODIUM CHLORIDE 0.9 % IV SOLN
500.0000 mg | Freq: Once | INTRAVENOUS | Status: AC
  Administered 2023-08-28: 500 mg via INTRAVENOUS
  Filled 2023-08-28: qty 5

## 2023-08-28 MED ORDER — DILTIAZEM HCL ER COATED BEADS 180 MG PO CP24
180.0000 mg | ORAL_CAPSULE | Freq: Every day | ORAL | Status: DC
  Administered 2023-08-28 – 2023-08-30 (×3): 180 mg via ORAL
  Filled 2023-08-28 (×4): qty 1

## 2023-08-28 MED ORDER — VITAMIN B-12 1000 MCG PO TABS
1000.0000 ug | ORAL_TABLET | Freq: Every day | ORAL | Status: DC
  Administered 2023-08-28 – 2023-08-30 (×3): 1000 ug via ORAL
  Filled 2023-08-28 (×3): qty 1

## 2023-08-28 NOTE — ED Notes (Signed)
 Gave pt a sandwich bag and a sprite to drink.

## 2023-08-28 NOTE — Evaluation (Signed)
 Clinical/Bedside Swallow Evaluation Patient Details  Name: Kathleen Santana MRN: [id number] Date of Birth: [birth year]  Today's Date: 08/28/2023 Time: SLP Start Time (ACUTE ONLY): 1150 SLP Stop Time (ACUTE ONLY): 1216 SLP Time Calculation (min) (ACUTE ONLY): 26 min  Past Medical History:  Past Medical History:  Diagnosis Date   Afib (HCC)    Arrhythmia    COVID-19    2022   GERD (gastroesophageal reflux disease)    Hypertension    Hypothyroidism    Lower extremity neuropathy    Osteoporosis    Pneumonia    2021   Rhinitis, non-allergic    Past Surgical History:  Past Surgical History:  Procedure Laterality Date   BACK SURGERY     CARDIOVERSION N/A 04/08/2023   Procedure: CARDIOVERSION;  Surgeon: Jeffrie Oneil BROCKS, MD;  Location: [facility] INVASIVE CV LAB;  Service: Cardiovascular;  Laterality: N/A;   CARDIOVERSION N/A 05/19/2023   Procedure: CARDIOVERSION;  Surgeon: Mona Vinie BROCKS, MD;  Location: [facility] INVASIVE CV LAB;  Service: Cardiovascular;  Laterality: N/A;   CATARACT EXTRACTION Bilateral    2021   NOSE SURGERY     OPEN REDUCTION INTERNAL FIXATION (ORIF) METACARPAL Left 01/13/2022   Procedure: OPEN REDUCTION INTERNAL FIXATION (ORIF) METACARPAL, LEFT INDEX AND LONG FINGER;  Surgeon: Shari Easter, MD;  Location: [facility] OR;  Service: Orthopedics;  Laterality: Left;  REGIONAL WITH IV SEDATION   HPI:  Kathleen Santana is a 89 y.o. female with history of atrial fibrillation, hypothyroidism, chronic kidney disease stage III, chronic anemia admitted in April of this year for respiratory failure in the setting of possible pneumonia and diastolic dysfunction has underwent cardioversion in May of this year and was still in A-fib and subsequently patient's amiodarone  was discontinued and changed to Cardizem  was brought to the ER after patient suddenly became short of breath while patient states she was volunteering.  Denies any chest pain she has been a persistent productive cough.  Patient states  she has been short of breath chronically but today became acutely worse.  Wheezing with productive cough. CT chest indicated Mild interstitial thickening within the lung bases, suggestive of early  edema. Small bilateral pleural effusions, right greater than left. Correlate  for any clinical signs or symptoms suggestive of congestive heart failure.  2. Diffuse bronchial wall thickening which may be seen in the setting of  chronic obstructive pulmonary disease.  ST consulted for clinical swallow evaluation.    Assessment / Plan / Recommendation  Clinical Impression  Pt seen for clinical swallow evaluation with reported intermittent regurgitation and globus sensation/hx of GERD reported by pt.  Daughter in attendance for evaluation.  Consistencies given included thin liquid via cup with single/successive swallows, puree, ice chips and solids.  Pt exhibited delayed cough intermittently during intake of puree/solids, but it was difficult to discern if these consistencies triggered coughing or it was residual from PNA.  Discussed esophageal precautions in detail with pt/daughter and gave hand-out with swallow precautions related to esophageal issues.  Pt/daughter in agreement.  Discussed potential for objective swallow assessment if symptoms persist.  Recommend continue current Regular/thin liquid diet with precautions listed above in place during meals/snacks.  ST will f/u briefly with a meal for tolerance/education during acute stay.  Thank you for this consult. SLP Visit Diagnosis: Dysphagia, unspecified (R13.10)    Aspiration Risk  Mild aspiration risk    Diet Recommendation   Thin;Age appropriate regular  Medication Administration: Whole meds with liquid    Other  Recommendations Oral  Care Recommendations: Oral care BID;Patient independent with oral care     Assistance Recommended at Discharge  None  Functional Status Assessment Patient has had a recent decline in their functional status and  demonstrates the ability to make significant improvements in function in a reasonable and predictable amount of time.  Frequency and Duration min 1 x/week  1 week       Prognosis Prognosis for improved oropharyngeal function: Good      Swallow Study   General Date of Onset: 08/27/23 HPI: Kathleen Santana is a 89 y.o. female with history of atrial fibrillation, hypothyroidism, chronic kidney disease stage III, chronic anemia admitted in April of this year for respiratory failure in the setting of possible pneumonia and diastolic dysfunction has underwent cardioversion in May of this year and was still in A-fib and subsequently patient's amiodarone  was discontinued and changed to Cardizem  was brought to the ER after patient suddenly became short of breath while patient states she was volunteering.  Denies any chest pain she has been a persistent productive cough.  Patient states she has been short of breath chronically but today became acutely worse.  Wheezing with productive cough. CT chest indicated Mild interstitial thickening within the lung bases, suggestive of early  edema. Small bilateral pleural effusions, right greater than left. Correlate  for any clinical signs or symptoms suggestive of congestive heart failure.  2. Diffuse bronchial wall thickening which may be seen in the setting of  chronic obstructive pulmonary disease.  ST consulted for clinical swallow evaluation. Type of Study: Bedside Swallow Evaluation Previous Swallow Assessment: n/a Diet Prior to this Study: Regular;Thin liquids (Level 0) Temperature Spikes Noted: No Respiratory Status: Nasal cannula (3L) History of Recent Intubation: No Behavior/Cognition: Alert;Cooperative;Pleasant mood Oral Cavity Assessment: Within Functional Limits Oral Care Completed by SLP: No Oral Cavity - Dentition: Adequate natural dentition Vision: Functional for self-feeding Self-Feeding Abilities: Able to feed self Patient Positioning:  Upright in bed Baseline Vocal Quality: Hoarse (slight) Volitional Cough: Strong Volitional Swallow: Able to elicit    Oral/Motor/Sensory Function Overall Oral Motor/Sensory Function: Within functional limits   Ice Chips Ice chips: Within functional limits Presentation: Spoon   Thin Liquid Thin Liquid: Within functional limits Presentation: Cup;Self Fed    Nectar Thick Nectar Thick Liquid: Not tested   Honey Thick Honey Thick Liquid: Not tested   Puree Puree: Impaired Presentation: Self Fed Pharyngeal Phase Impairments: Cough - Delayed   Solid     Solid: Impaired Presentation: Self Fed Pharyngeal Phase Impairments: Cough - Delayed      Pat Kandi Brusseau,M.S.,CCC-SLP 08/28/2023,1:33 PM

## 2023-08-28 NOTE — ED Notes (Addendum)
 Admitting provider at bedside.

## 2023-08-28 NOTE — H&P (Addendum)
 History and Physical    Kathleen Santana FMW:[id number] DOB: 02-24-1926 DOA: 08/27/2023  Patient coming from: Home.  Chief Complaint: Shortness of breath.  HPI: Kathleen Santana is a 89 y.o. female with history of atrial fibrillation, hypothyroidism, chronic kidney disease stage III, chronic anemia admitted in April of this year for respiratory failure in the setting of possible pneumonia and diastolic dysfunction has underwent cardioversion in May of this year and was still in A-fib and subsequently patient's amiodarone  was discontinued and changed to Cardizem  was brought to the ER after patient suddenly became short of breath while patient states she was volunteering.  Denies any chest pain she has been a persistent productive cough.  Patient states she has been short of breath chronically but today became acutely worse.  Wheezing with productive cough.  Denies any recent travel or sick contacts.  ED Course: In the ER patient was requiring 2 L oxygen with chest x-ray showing right-sided pleural effusion and also concern for pneumonia.  Was given Lasix  20 mg IV and started on antibiotics for pneumonia.  On my exam patient is wheezing started on nebulizer.  COVID and flu test were negative.  Labs show creatinine of 1.8 which is increased from baseline.  BNP of 181.  Hemoglobin 11.3.  EKG shows A-fib rate controlled.  Review of Systems: As per HPI, rest all negative.   Past Medical History:  Diagnosis Date   Afib (HCC)    Arrhythmia    COVID-19    2022   GERD (gastroesophageal reflux disease)    Hypertension    Hypothyroidism    Lower extremity neuropathy    Osteoporosis    Pneumonia    2021   Rhinitis, non-allergic     Past Surgical History:  Procedure Laterality Date   BACK SURGERY     CARDIOVERSION N/A 04/08/2023   Procedure: CARDIOVERSION;  Surgeon: Jeffrie Oneil BROCKS, MD;  Location: [facility];  Service: Cardiovascular;  Laterality: N/A;   CARDIOVERSION N/A 05/19/2023    Procedure: CARDIOVERSION;  Surgeon: Mona Vinie BROCKS, MD;  Location: [facility];  Service: Cardiovascular;  Laterality: N/A;   CATARACT EXTRACTION Bilateral    2021   NOSE SURGERY     OPEN REDUCTION INTERNAL FIXATION (ORIF) METACARPAL Left 01/13/2022   Procedure: OPEN REDUCTION INTERNAL FIXATION (ORIF) METACARPAL, LEFT INDEX AND LONG FINGER;  Surgeon: Shari Easter, MD;  Location: [facility] OR;  Service: Orthopedics;  Laterality: Left;  REGIONAL WITH IV SEDATION     reports that she has quit smoking. Her smoking use included cigarettes. She has never used smokeless tobacco. She reports that she does not drink alcohol  and does not use drugs.  Allergies  Allergen Reactions   Nickel Rash   Norco [Hydrocodone-Acetaminophen ] Nausea And Vomiting and Other (See Comments)    Passed out, dizziness   Hydrocodone Other (See Comments)   Other Other (See Comments) and Cough    Seasonal allergies- congestion and itchy eyes, also    Family History  Problem Relation Age of Onset   Arrhythmia Mother    Heart disease Father     Prior to Admission medications   Medication Sig Start Date End Date Taking? Authorizing Provider  albuterol  (VENTOLIN  HFA) 108 (90 Base) MCG/ACT inhaler Inhale 2 puffs into the lungs every 4 (four) hours as needed for wheezing or shortness of breath. 05/02/23 08/27/24 Yes O'Neal, Darryle Ned, MD  apixaban  (ELIQUIS ) 2.5 MG TABS tablet Take 1 tablet (2.5 mg total) by mouth 2 (  two) times daily. 12/10/22  Yes Monge, Damien BROCKS, NP  cholecalciferol (VITAMIN D3) 25 MCG (1000 UNIT) tablet Take 1,000 Units by mouth daily.   Yes [provider]  cyanocobalamin  (VITAMIN B12) 1000 MCG tablet Take 1,000 mcg by mouth daily.   Yes [provider]  diltiazem  (CARDIZEM  CD) 180 MG 24 hr capsule Take 180 mg by mouth daily. 08/22/23  Yes [provider]  gabapentin  (NEURONTIN ) 100 MG capsule 1-2 capsules at bedtime or when the right thigh pain is worse, in addition to the  baseline gabapentin  Orally Once a day; Duration: 30 days 08/21/23  Yes [provider]  gabapentin  (NEURONTIN ) 300 MG capsule Take 300 mg by mouth 2 (two) times daily.   Yes [provider]  ipratropium (ATROVENT ) 0.06 % nasal spray Place 1 spray into both nostrils 3 (three) times daily as needed for rhinitis.   Yes [provider]  levothyroxine  (SYNTHROID ) 50 MCG tablet Take 50 mcg by mouth daily before breakfast.   Yes [provider]  magnesium  gluconate (MAGONATE) 500 (27 Mg) MG TABS tablet Take 500 mg by mouth daily.   Yes [provider]  Multiple Vitamin (MULTIVITAMIN WITH MINERALS) TABS tablet Take 1 tablet by mouth daily with breakfast. One A Day for Women   Yes [provider]  pantoprazole  (PROTONIX ) 40 MG tablet Take 40 mg by mouth 2 (two) times daily. 08/21/23  Yes [provider]  Propylene Glycol, PF, (SYSTANE COMPLETE PF) 0.6 % SOLN Place 1 drop into both eyes 3 (three) times daily as needed (dry/irritated eyes.).   Yes [provider]  diltiazem  (CARDIZEM ) 30 MG tablet Take 1 tablet (30 mg total) by mouth as needed. For fast heart rates Patient not taking: Reported on 08/28/2023 08/07/23   Mealor, Eulas BRAVO, MD  diltiazem  (TIAZAC ) 240 MG 24 hr capsule Take 240 mg by mouth daily. Patient not taking: Reported on 08/28/2023 07/25/23   [provider]  furosemide  (LASIX ) 20 MG tablet Take 1 tablet (20 mg total) by mouth daily. Patient not taking: Reported on 08/07/2023 06/13/23   Barbaraann Darryle Ned, MD    Physical Exam: Constitutional: Moderately built and nourished. Vitals:   08/27/23 2236 08/27/23 2242 08/28/23 0200 08/28/23 0257  BP: 133/64  109/72   Pulse: (!) 51  91   Resp: 20  18   Temp: 98 F (36.7 C)   98 F (36.7 C)  TempSrc: Oral   Oral  SpO2: 96%  100%   Weight:  51.5 kg    Height:  5' 3 (1.6 m)     Eyes: Anicteric no pallor. ENMT: No discharge from the ears eyes nose and mouth. Neck:  No JVD appreciated no mass felt. Respiratory: Bilateral expiratory wheeze and no crepitations. Cardiovascular: S1-S2 heard. Abdomen: Soft nontender bowel sound present. Musculoskeletal: No edema. Skin: No rash. Neurologic: Alert awake oriented time place and person.  Moves all extremities. Psychiatric: Appears normal.  Normal affect.   Labs on Admission: I have personally reviewed following labs and imaging studies  CBC: Recent Labs  Lab 08/27/23 2305  WBC 9.5  NEUTROABS 8.0*  HGB 11.3*  HCT 34.3*  MCV 90.5  PLT 238   Basic Metabolic Panel: Recent Labs  Lab 08/27/23 2305  NA 132*  K 4.2  CL 101  CO2 19*  GLUCOSE 164*  BUN 30*  CREATININE 1.89*  CALCIUM 8.9   GFR: Estimated Creatinine Clearance: 14.5 mL/min (A) (by C-G formula based on SCr of 1.89  mg/dL (H)). Liver Function Tests: No results for input(s): AST, ALT, ALKPHOS, BILITOT, PROT, ALBUMIN in the last 168 hours. No results for input(s): LIPASE, AMYLASE in the last 168 hours. No results for input(s): AMMONIA in the last 168 hours. Coagulation Profile: No results for input(s): INR, PROTIME in the last 168 hours. Cardiac Enzymes: No results for input(s): CKTOTAL, CKMB, CKMBINDEX, TROPONINI in the last 168 hours. BNP (last 3 results) No results for input(s): PROBNP in the last 8760 hours. HbA1C: No results for input(s): HGBA1C in the last 72 hours. CBG: No results for input(s): GLUCAP in the last 168 hours. Lipid Profile: No results for input(s): CHOL, HDL, LDLCALC, TRIG, CHOLHDL, LDLDIRECT in the last 72 hours. Thyroid Function Tests: No results for input(s): TSH, T4TOTAL, FREET4, T3FREE, THYROIDAB in the last 72 hours. Anemia Panel: No results for input(s): VITAMINB12, FOLATE, FERRITIN, TIBC, IRON, RETICCTPCT in the last 72 hours. Urine analysis:    Component Value Date/Time   COLORURINE STRAW (A) 05/07/2023 1444   APPEARANCEUR  CLEAR 05/07/2023 1444   LABSPEC 1.006 05/07/2023 1444   PHURINE 5.0 05/07/2023 1444   GLUCOSEU NEGATIVE 05/07/2023 1444   HGBUR NEGATIVE 05/07/2023 1444   BILIRUBINUR NEGATIVE 05/07/2023 1444   KETONESUR NEGATIVE 05/07/2023 1444   PROTEINUR NEGATIVE 05/07/2023 1444   NITRITE NEGATIVE 05/07/2023 1444   LEUKOCYTESUR NEGATIVE 05/07/2023 1444   Sepsis Labs: @LABRCNTIP (procalcitonin:4,lacticidven:4) ) Recent Results (from the past 240 hours)  Resp panel by RT-PCR (RSV, Flu A&B, Covid) Anterior Nasal Swab     Status: None   Collection Time: 08/27/23 11:05 PM   Specimen: Anterior Nasal Swab  Result Value Ref Range Status   SARS Coronavirus 2 by RT PCR NEGATIVE NEGATIVE Final   Influenza A by PCR NEGATIVE NEGATIVE Final   Influenza B by PCR NEGATIVE NEGATIVE Final    Comment: (NOTE) The Xpert Xpress SARS-CoV-2/FLU/RSV plus assay is intended as an aid in the diagnosis of influenza from Nasopharyngeal swab specimens and should not be used as a sole basis for treatment. Nasal washings and aspirates are unacceptable for Xpert Xpress SARS-CoV-2/FLU/RSV testing.  Fact Sheet for Patients: BloggerCourse.com  Fact Sheet for Healthcare Providers: SeriousBroker.it  This test is not yet approved or cleared by the [place]  FDA and has been authorized for detection and/or diagnosis of SARS-CoV-2 by FDA under an Emergency Use Authorization (EUA). This EUA will remain in effect (meaning this test can be used) for the duration of the COVID-19 declaration under Section 564(b)(1) of the Act, 21 U.S.C. section 360bbb-3(b)(1), unless the authorization is terminated or revoked.     Resp Syncytial Virus by PCR NEGATIVE NEGATIVE Final    Comment: (NOTE) Fact Sheet for Patients: BloggerCourse.com  Fact Sheet for Healthcare Providers: SeriousBroker.it  This test is not yet approved or cleared by  the [place]  FDA and has been authorized for detection and/or diagnosis of SARS-CoV-2 by FDA under an Emergency Use Authorization (EUA). This EUA will remain in effect (meaning this test can be used) for the duration of the COVID-19 declaration under Section 564(b)(1) of the Act, 21 U.S.C. section 360bbb-3(b)(1), unless the authorization is terminated or revoked.  Performed at [facility], [address] [place], [place] [address]      Radiological Exams on Admission: DG Chest 2 View Result Date: 08/27/2023 CLINICAL DATA:  Shortness of breath EXAM: CHEST - 2 VIEW COMPARISON:  Chest x-ray 05/09/2023 FINDINGS: The heart is enlarged. There are atherosclerotic calcifications of the aorta. There is a small right  pleural effusion. There are minimal patchy opacities in the anterior left upper lobe. The lungs are otherwise clear. There is no pneumothorax or acute fracture. IMPRESSION: 1. Cardiomegaly. 2. Small right pleural effusion. 3. Minimal patchy opacities in the anterior left upper lobe, atelectasis versus infection. Electronically Signed   By: Greig Pique M.D.   On: 08/27/2023 23:36    EKG: Independently reviewed.  A-fib rate controlled.  Assessment/Plan Principal Problem:   Acute respiratory failure with hypoxia (HCC) Active Problems:   Acute diastolic heart failure (HCC)   Severe tricuspid valve regurgitation   Paroxysmal atrial fibrillation (HCC)   Essential hypertension   Hypothyroidism   Chronic kidney disease, stage III (moderate) (HCC)   Renal failure (ARF), acute on chronic (HCC)   Anemia    Acute respiratory failure with hypoxia likely combination of acute on chronic diastolic CHF and possible pneumonia/bronchitis.  Patient was given Lasix  20 mg IV based on response will plan further doses.  Last 2D echo done was in April 2025 which showed EF of 50 to 55%.  Moderate MR.  Closely follow intake output metabolic panel Daily weights.  Opponents are  pending. Possible pneumonia versus bronchitis/possible aspiration given the acute onset for which patient is on empiric antibiotics.  Since patient is wheezing I placed patient on Xopenex  nebulizer.  Check respiratory viral panel.  Speech therapy evaluation for swallow. Acute on chronic kidney disease stage III with hyponatremia patient did receive IV Lasix  in the ER.  I think may improve the sodium.  Closely monitor creatinine. History of A-fib on Cardizem  for rate control and Eliquis  for anticoagulation. Hypothyroidism on Synthroid . Anemia appears to be chronic likely from renal disease follow CBC.  Since patient has acute respiratory failure with hypoxia likely from CHF and pneumonia will need close monitoring further workup IV diuresis antibiotics and more than 2 midnight stay.   DVT prophylaxis: Eliquis . Code Status: Full code confirmed with patient. Family Communication: Patient. Disposition Plan: Monitored bed. Consults called: None. Admission status: Observation.

## 2023-08-28 NOTE — ED Notes (Signed)
 2W notified of patient coming up.

## 2023-08-28 NOTE — Progress Notes (Signed)
 [facility] PROGRESS NOTE    Progress Note  Kathleen Santana  FMW:[id number] DOB: 1926-08-18 DOA: 08/27/2023 PCP: Clarice Nottingham, MD     Brief Narrative:   Kathleen Santana is an 89 y.o. 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  Assessment/Plan:   Acute respiratory failure with hypoxia multifactorial acute diastolic dysfunction possible pneumonia: Last 2D echo showed an EF 55% with moderate MR. Chest x-ray shows small pleural effusion with minimal patchy opacities, BNP was not 181.  SARS-CoV-2 influenza PCR and RSV are negative. Check a procalcitonin, check a CT scan of the chest without contrast, also check a respiratory panel as she is afebrile with no leukocytosis. She has diffuse rhonchi bilaterally, has had minimal urine output on Lasix . Will not give additional Lasix  continue IV Rocephin  and azithromycin . Follow-up on CT scan of the chest. She has no JVD, does have trace edema on lower extremity with no crackles on physical exam. This most likely could be a viral infection.  Acute kidney injury on chronic kidney stage IIIa: With hyponatremia, she did receive Lasix  in the ED. Going back to her chart her creatinine usually runs like around 1.5-1.4, even in March 2025 creatinine was 1.1, so I believe she might be a little bit dehydrated with his rising creatinine and new hyponatremia. Will hold Lasix . Started on gentle IV fluids recheck basic metabolic panel in the morning.  History of A-fib: Currently rate controlled continue Cardizem  and Eliquis .  Hypothyroidism: Continue Synthroid .  Anemia of chronic renal disease: Hemoglobin  appears stable. MCV is borderline high RDW's elevated check a B12 and folate.  Hypovolemic hyponatremia: Will start her on IV fluids recheck a basic metabolic panel in the morning    DVT prophylaxis: lovenox  Family Communication:none Status is: Observation The patient will require care spanning > 2 midnights and should be moved to inpatient because: Acute respiratory failure with hypoxia    Code Status:     Code Status Orders  (From admission, onward)           Start     Ordered   08/28/23 0348  Full code  Continuous       Question:  By:  Answer:  Consent: discussion documented in EHR   08/28/23 0348           Code Status History     Date Active Date Inactive Code Status Order ID Comments User Context   05/06/2023 1426 05/11/2023 1833 Full Code [id number]  Claudene Maximino LABOR, MD ED   09/04/2019 2107 09/06/2019 1833 Full Code [id number]  Arminda Daved HERO, MD ED         IV Access:   Peripheral IV   Procedures and diagnostic studies:   DG Chest 2 View Result Date: 08/27/2023 CLINICAL DATA:  Shortness of breath EXAM: CHEST - 2 VIEW COMPARISON:  Chest x-ray 05/09/2023 FINDINGS: The heart is enlarged. There are atherosclerotic calcifications of the aorta. There is a small right pleural effusion. There are minimal patchy opacities in the anterior left upper lobe. The lungs are otherwise clear. There is no pneumothorax or acute fracture. IMPRESSION: 1. Cardiomegaly. 2. Small right pleural effusion. 3. Minimal patchy opacities in the anterior left upper lobe, atelectasis versus infection.  Electronically Signed   By: Greig Pique M.D.   On: 08/27/2023 23:36     Medical Consultants:   None.   Subjective:    Kathleen Santana Re she relates her breathing is about the same she relates her cough has been getting worst over the last week  Objective:    Vitals:   08/27/23 2242 08/28/23 0200 08/28/23 0257 08/28/23 0500  BP:  109/72    Pulse:  91    Resp:  18     Temp:   98 F (36.7 C)   TempSrc:   Oral   SpO2:  100%  100%  Weight: 51.5 kg     Height: 5' 3 (1.6 m)      SpO2: 100 % O2 Flow Rate (L/min): 3 L/min   Intake/Output Summary (Last 24 hours) at 08/28/2023 0616 Last data filed at 08/28/2023 0240 Gross per 24 hour  Intake 350.47 ml  Output --  Net 350.47 ml   Filed Weights   08/27/23 2242  Weight: 51.5 kg    Exam: General exam: In no acute distress. Respiratory system: Good air movement and diffuse crackles bilaterally Cardiovascular system: S1 & S2 heard, RRR. No JVD. Gastrointestinal system: Abdomen is nondistended, soft and nontender.  Extremities: No pedal edema. Skin: No rashes, lesions or ulcers Psychiatry: Judgement and insight appear normal. Mood & affect appropriate.    Data Reviewed:    Labs: Basic Metabolic Panel: Recent Labs  Lab 08/27/23 2305 08/28/23 0417  NA 132* 133*  K 4.2 4.1  CL 101 102  CO2 19* 21*  GLUCOSE 164* 173*  BUN 30* 28*  CREATININE 1.89* 2.00*  CALCIUM 8.9 8.5*  MG  --  2.2   GFR Estimated Creatinine Clearance: 13.7 mL/min (A) (by C-G formula based on SCr of 2 mg/dL (H)). Liver Function Tests: Recent Labs  Lab 08/28/23 0417  AST 85*  ALT 58*  ALKPHOS 102  BILITOT 0.5  PROT 7.3  ALBUMIN 3.3*   No results for input(s): LIPASE, AMYLASE in the last 168 hours. No results for input(s): AMMONIA in the last 168 hours. Coagulation profile No results for input(s): INR, PROTIME in the last 168 hours. COVID-19 Labs  No results for input(s): DDIMER, FERRITIN, LDH, CRP in the last 72 hours.  Lab Results  Component Value Date   SARSCOV2NAA NEGATIVE 08/27/2023   SARSCOV2NAA NEGATIVE 05/06/2023   SARSCOV2NAA NEGATIVE 09/04/2019    CBC: Recent Labs  Lab 08/27/23 2305 08/28/23 0417  WBC 9.5 9.8  NEUTROABS 8.0* 8.4*  HGB 11.3* 10.5*  HCT 34.3* 32.3*  MCV 90.5 90.2  PLT 238 249   Cardiac Enzymes: No results for input(s): CKTOTAL, CKMB,  CKMBINDEX, TROPONINI in the last 168 hours. BNP (last 3 results) No results for input(s): PROBNP in the last 8760 hours. CBG: No results for input(s): GLUCAP in the last 168 hours. D-Dimer: No results for input(s): DDIMER in the last 72 hours. Hgb A1c: No results for input(s): HGBA1C in the last 72 hours. Lipid Profile: No results for input(s): CHOL, HDL, LDLCALC, TRIG, CHOLHDL, LDLDIRECT in the last 72 hours. Thyroid function studies: Recent Labs    08/28/23 0417  TSH 2.633   Anemia work up: No results for input(s): VITAMINB12, FOLATE, FERRITIN, TIBC, IRON, RETICCTPCT in the last 72 hours. Sepsis Labs: Recent Labs  Lab 08/27/23 2305 08/28/23 0417  WBC 9.5 9.8   Microbiology Recent Results (from the past 240 hours)  Resp panel by RT-PCR (RSV, Flu A&B, Covid) Anterior Nasal  Swab     Status: None   Collection Time: 08/27/23 11:05 PM   Specimen: Anterior Nasal Swab  Result Value Ref Range Status   SARS Coronavirus 2 by RT PCR NEGATIVE NEGATIVE Final   Influenza A by PCR NEGATIVE NEGATIVE Final   Influenza B by PCR NEGATIVE NEGATIVE Final    Comment: (NOTE) The Xpert Xpress SARS-CoV-2/FLU/RSV plus assay is intended as an aid in the diagnosis of influenza from Nasopharyngeal swab specimens and should not be used as a sole basis for treatment. Nasal washings and aspirates are unacceptable for Xpert Xpress SARS-CoV-2/FLU/RSV testing.  Fact Sheet for Patients: BloggerCourse.com  Fact Sheet for Healthcare Providers: SeriousBroker.it  This test is not yet approved or cleared by the [place]  FDA and has been authorized for detection and/or diagnosis of SARS-CoV-2 by FDA under an Emergency Use Authorization (EUA). This EUA will remain in effect (meaning this test can be used) for the duration of the COVID-19 declaration under Section 564(b)(1) of the Act, 21 U.S.C. section  360bbb-3(b)(1), unless the authorization is terminated or revoked.     Resp Syncytial Virus by PCR NEGATIVE NEGATIVE Final    Comment: (NOTE) Fact Sheet for Patients: BloggerCourse.com  Fact Sheet for Healthcare Providers: SeriousBroker.it  This test is not yet approved or cleared by the [place]  FDA and has been authorized for detection and/or diagnosis of SARS-CoV-2 by FDA under an Emergency Use Authorization (EUA). This EUA will remain in effect (meaning this test can be used) for the duration of the COVID-19 declaration under Section 564(b)(1) of the Act, 21 U.S.C. section 360bbb-3(b)(1), unless the authorization is terminated or revoked.  Performed at [facility], [address] [place], [place] [address]      Medications:    apixaban   2.5 mg Oral BID   cyanocobalamin   1,000 mcg Oral Daily   diltiazem   180 mg Oral Daily   gabapentin   300 mg Oral Daily   levalbuterol   0.63 mg Nebulization Q6H   levothyroxine   50 mcg Oral QAC breakfast   magnesium  gluconate  500 mg Oral Daily   pantoprazole   40 mg Oral BID   Continuous Infusions:  azithromycin      cefTRIAXone  (ROCEPHIN )  IV        LOS: 0 days   Erle Odell Castor  [facility]  08/28/2023, 6:16 AM

## 2023-08-28 NOTE — Progress Notes (Signed)
 PT Cancellation Note  Patient Details Name: Kathleen Santana MRN: [id number] DOB: [birth year]   Cancelled Treatment:    Reason Eval/Treat Not Completed: Patient at procedure or test/unavailable (Attempted several times today and patient unavailable. Will continue with efforts.)  Randine Essex, PT, MPT  Randine LULLA Essex 08/28/2023, 3:14 PM

## 2023-08-28 NOTE — Progress Notes (Signed)
 Transition of Care [facility]) - Inpatient Brief Assessment   Patient Details  Name: Kathleen Santana MRN: [id number] Date of Birth: [birth year]  [facility]) CM/SW Contact:    Rosaline JONELLE Joe, RN Phone Number: 08/28/2023, 2:33 PM   Clinical Narrative: Patient admitted to the hospital with shortness of breath, pneumonia.  Patient lives alone and was independent prior to admission to the hospital.  Patient is currently on [place] oxygen in the hospital and receiving IV antibiotics.  Patient has no DME at the home.  Patient wears bilateral hearing aides and has spirometer at home.  PCP - Dr. Clarice.  No IP Care management needs at this time.  Patient is currently pending PT evaluation.   Transition of Care Asessment: Insurance and Status: (P) Insurance coverage has been reviewed Patient has primary care physician: (P) Yes Home environment has been reviewed: (P) from alone Prior level of function:: (P) self Prior/Current Home Services: (P) No current home services Social Drivers of Health Review: (P) SDOH reviewed no interventions necessary Readmission risk has been reviewed: (P) Yes Transition of care needs: (P) no transition of care needs at this time

## 2023-08-29 ENCOUNTER — Inpatient Hospital Stay (HOSPITAL_COMMUNITY)

## 2023-08-29 DIAGNOSIS — I480 Paroxysmal atrial fibrillation: Secondary | ICD-10-CM | POA: Diagnosis not present

## 2023-08-29 DIAGNOSIS — I5031 Acute diastolic (congestive) heart failure: Secondary | ICD-10-CM | POA: Diagnosis not present

## 2023-08-29 DIAGNOSIS — J9601 Acute respiratory failure with hypoxia: Secondary | ICD-10-CM | POA: Diagnosis not present

## 2023-08-29 DIAGNOSIS — I071 Rheumatic tricuspid insufficiency: Secondary | ICD-10-CM | POA: Diagnosis not present

## 2023-08-29 LAB — BASIC METABOLIC PANEL WITH GFR
Anion gap: 7 (ref 5–15)
BUN: 27 mg/dL — ABNORMAL HIGH (ref 8–23)
CO2: 23 mmol/L (ref 22–32)
Calcium: 8.4 mg/dL — ABNORMAL LOW (ref 8.9–10.3)
Chloride: 106 mmol/L (ref 98–111)
Creatinine, Ser: 1.77 mg/dL — ABNORMAL HIGH (ref 0.44–1.00)
GFR, Estimated: 26 mL/min — ABNORMAL LOW (ref >60)
Glucose, Bld: 102 mg/dL — ABNORMAL HIGH (ref 70–99)
Potassium: 4.3 mmol/L (ref 3.5–5.1)
Sodium: 136 mmol/L (ref 135–145)

## 2023-08-29 LAB — RESPIRATORY PANEL BY PCR

## 2023-08-29 NOTE — Progress Notes (Signed)
 [facility] PROGRESS NOTE    Progress Note  Kathleen Santana  FMW:[id number] DOB: [birth year] DOA: 08/27/2023 PCP: Clarice Nottingham, MD     Brief Narrative:   Kathleen Santana is an 89 y.o. 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  Assessment/Plan:   Acute respiratory failure with hypoxia multifactorial acute diastolic dysfunction possible pneumonia: Last 2D echo showed an EF 55% with moderate MR. Chest x-ray shows small pleural effusion with minimal patchy opacities, BNP was not 181.  SARS-CoV-2, influenza PCR and RSV are negative.  Cardiac biomarkers are negative, twelve-lead EKG shows A-fib rate controlled nonspecific T wave changes. Procalcitonin is low yield, CT scan chest showed mild interstitial thickening of the lung bases, small bilateral pleural effusion right greater than left diffuse bronchial thickening and a new 3 mm left upper lobe nodule. Respiratory panel is pending. Discontinue empiric antibiotics. Concerned about a viral infection. She relates her breathing send feeling better than yesterday they have weaned her to room air. Lungs are clear no JVD. Asymmetric lower extremity swelling check lower extremity Doppler, would likely be negative in the setting of Eliquis . Question if her dyspnea was likely due to hypovolemia.  Acute kidney injury on chronic kidney stage IIIa: With a baseline creatinine going back to the chart on March 2025 was 1.1,  On admission 2. Lasix  was discontinued she was given IV fluids for 8 hours, her creatinine this morning is 1.7. Hyponatremia has resolved.   Will hold Lasix .  History of  A-fib: Currently rate controlled continue Cardizem  and Eliquis .  Hypothyroidism: Continue Synthroid .  Anemia of chronic renal disease: Hemoglobin appears stable. MCV is borderline high RDW's elevated check a B12 and folate.  Hypovolemic hyponatremia: Resolved with IV fluids.   DVT prophylaxis: lovenox  Family Communication:none Status is: Observation The patient will require care spanning > 2 midnights and should be moved to inpatient because: Acute respiratory failure with hypoxia    Code Status:     Code Status Orders  (From admission, onward)           Start     Ordered   08/28/23 0348  Full code  Continuous       Question:  By:  Answer:  Consent: discussion documented in EHR   08/28/23 0348           Code Status History     Date Active Date Inactive Code Status Order ID Comments User Context   05/06/2023 1426 05/11/2023 1833 Full Code [id number]  Claudene Maximino LABOR, MD ED   09/04/2019 2107 09/06/2019 1833 Full Code [id number]  Arminda Daved HERO, MD ED         IV Access:   Peripheral IV   Procedures and diagnostic studies:   CT CHEST WO CONTRAST Result Date: 08/28/2023 EXAM: CT CHEST WITHOUT CONTRAST 08/28/2023 07:10:00 AM TECHNIQUE: CT of the chest was performed without the administration of intravenous contrast. Multiplanar reformatted images are provided for review. Automated exposure control, iterative reconstruction, and/or weight based adjustment of the mA/kV was utilized to reduce the radiation dose to as low as reasonably achievable. COMPARISON: 09/05/2023 CLINICAL HISTORY: COPD suspected. SOB. COPD. Abnormal chest x ray. FINDINGS: MEDIASTINUM: Mild cardiac enlargement. No pericardial effusion. Aortic atherosclerosis and  coronary artery calcifications. Increased caliber of the main pulmonary artery suggests PA hypertension. LYMPH NODES: No mediastinal, hilar or axillary lymphadenopathy. LUNGS AND PLEURA: Small bilateral pleural effusions, right greater  than left. No pneumothorax. Diffuse bronchial wall thickening Mild interstitial thickening within the lung bases suggests early edema. No pneumothorax or airspace consolidation. There are several scattered lung nodules identified. Within the left upper lobe there is a new 2 mm lung nodule, image 38/4. Also in the left upper lobe is a new 3 mm peripheral nodule, image 69/4. SOFT TISSUES/BONES: No acute osseous findings. Multilevel lumbar degenerative disc disease. Thoracolumbar scoliosis as before. UPPER ABDOMEN: No acute abnormality within the imaged portions of the upper abdomen. Cyst within the left kidney is partially visualized measuring 4.1 cm. Several additional left kidney cysts are also noted. No specific follow up imaging of these renal cysts is recommended unless clinically indicated. IMPRESSION: 1. Mild interstitial thickening within the lung bases, suggestive of early edema. Small bilateral pleural effusions, right greater than left. Correlate for any clinical signs or symptoms suggestive of congestive heart failure. 2. Diffuse bronchial wall thickening which may be seen in the setting of chronic obstructive pulmonary disease. 3. New 2 mm and 3 mm nodules in the left upper lobe. If the patient is at low risk, no further follow-up imaging is recommended. In a patient that is at increased risk follow-up imaging at 12 months may be considered. 4. Increased caliber of the main pulmonary artery, suggestive of PA hypertension. Electronically signed by: Waddell Calk MD 08/28/2023 07:27 AM EDT RP Workstation: [id number]   DG Chest 2 View Result Date: 08/27/2023 CLINICAL DATA:  Shortness of breath EXAM: CHEST - 2 VIEW COMPARISON:  Chest x-ray 05/09/2023 FINDINGS: The heart is enlarged. There are atherosclerotic calcifications of the aorta. There is a small right pleural effusion. There are minimal patchy opacities in the anterior left upper lobe. The lungs are otherwise clear. There is no pneumothorax or  acute fracture. IMPRESSION: 1. Cardiomegaly. 2. Small right pleural effusion. 3. Minimal patchy opacities in the anterior left upper lobe, atelectasis versus infection. Electronically Signed   By: Greig Pique M.D.   On: 08/27/2023 23:36     Medical Consultants:   None.   Subjective:    Kathleen Santana relates her breathing is better she also her appetite has improved.  Objective:    Vitals:   08/29/23 0057 08/29/23 0500 08/29/23 0556 08/29/23 0827  BP: (!) 102/56  117/85 (!) 144/72  Pulse: (!) 58  81 80  Resp: 18  18   Temp: 98.3 F (36.8 C)  97.7 F (36.5 C) 97.8 F (36.6 C)  TempSrc:      SpO2: 93%  96% 95%  Weight:  51.2 kg    Height:       SpO2: 95 % O2 Flow Rate (L/min): 2 L/min   Intake/Output Summary (Last 24 hours) at 08/29/2023 1008 Last data filed at 08/29/2023 9372 Gross per 24 hour  Intake 695.65 ml  Output 900 ml  Net -204.35 ml   Filed Weights   08/27/23 2242 08/29/23 0500  Weight: 51.5 kg 51.2 kg    Exam: General exam: In no acute distress. Respiratory system: She is good air movement lungs are clear this morning. Cardiovascular system: S1 & S2 heard, RRR. No JVD.  Negative JVD Gastrointestinal system: Abdomen is nondistended, soft and nontender.  Extremities: No pedal edema. Skin: No rashes, lesions or ulcers Psychiatry: Judgement and insight appear normal. Mood & affect appropriate.  Data Reviewed:    Labs: Basic Metabolic Panel: Recent Labs  Lab 08/27/23 2305 08/28/23 0417 08/29/23 0318  NA 132* 133* 136  K 4.2 4.1 4.3  CL 101 102 106  CO2 19* 21* 23  GLUCOSE 164* 173* 102*  BUN 30* 28* 27*  CREATININE 1.89* 2.00* 1.77*  CALCIUM 8.9 8.5* 8.4*  MG  --  2.2  --    GFR Estimated Creatinine Clearance: 15.4 mL/min (A) (by C-G formula based on SCr of 1.77 mg/dL (H)). Liver Function Tests: Recent Labs  Lab 08/28/23 0417  AST 85*  ALT 58*  ALKPHOS 102  BILITOT 0.5  PROT 7.3  ALBUMIN 3.3*   No results for  input(s): LIPASE, AMYLASE in the last 168 hours. No results for input(s): AMMONIA in the last 168 hours. Coagulation profile No results for input(s): INR, PROTIME in the last 168 hours. COVID-19 Labs  No results for input(s): DDIMER, FERRITIN, LDH, CRP in the last 72 hours.  Lab Results  Component Value Date   SARSCOV2NAA NEGATIVE 08/27/2023   SARSCOV2NAA NEGATIVE 05/06/2023   SARSCOV2NAA NEGATIVE 09/04/2019    CBC: Recent Labs  Lab 08/27/23 2305 08/28/23 0417  WBC 9.5 9.8  NEUTROABS 8.0* 8.4*  HGB 11.3* 10.5*  HCT 34.3* 32.3*  MCV 90.5 90.2  PLT 238 249   Cardiac Enzymes: No results for input(s): CKTOTAL, CKMB, CKMBINDEX, TROPONINI in the last 168 hours. BNP (last 3 results) No results for input(s): PROBNP in the last 8760 hours. CBG: No results for input(s): GLUCAP in the last 168 hours. D-Dimer: No results for input(s): DDIMER in the last 72 hours. Hgb A1c: No results for input(s): HGBA1C in the last 72 hours. Lipid Profile: No results for input(s): CHOL, HDL, LDLCALC, TRIG, CHOLHDL, LDLDIRECT in the last 72 hours. Thyroid function studies: Recent Labs    08/28/23 0417  TSH 2.633   Anemia work up: Recent Labs    08/28/23 0830  VITAMINB12 1,798*  FOLATE 28.3   Sepsis Labs: Recent Labs  Lab 08/27/23 2305 08/28/23 0417 08/28/23 0830  PROCALCITON  --   --  <0.10  WBC 9.5 9.8  --    Microbiology Recent Results (from the past 240 hours)  Resp panel by RT-PCR (RSV, Flu A&B, Covid) Anterior Nasal Swab     Status: None   Collection Time: 08/27/23 11:05 PM   Specimen: Anterior Nasal Swab  Result Value Ref Range Status   SARS Coronavirus 2 by RT PCR NEGATIVE NEGATIVE Final   Influenza A by PCR NEGATIVE NEGATIVE Final   Influenza B by PCR NEGATIVE NEGATIVE Final    Comment: (NOTE) The Xpert Xpress SARS-CoV-2/FLU/RSV plus assay is intended as an aid in the diagnosis of influenza from Nasopharyngeal swab  specimens and should not be used as a sole basis for treatment. Nasal washings and aspirates are unacceptable for Xpert Xpress SARS-CoV-2/FLU/RSV testing.  Fact Sheet for Patients: BloggerCourse.com  Fact Sheet for Healthcare Providers: SeriousBroker.it  This test is not yet approved or cleared by the [place]  FDA and has been authorized for detection and/or diagnosis of SARS-CoV-2 by FDA under an Emergency Use Authorization (EUA). This EUA will remain in effect (meaning this test can be used) for the duration of the COVID-19 declaration under Section 564(b)(1) of the Act, 21 U.S.C. section 360bbb-3(b)(1), unless the authorization is terminated or revoked.     Resp Syncytial Virus by PCR NEGATIVE NEGATIVE Final    Comment: (NOTE) Fact Sheet for Patients: BloggerCourse.com  Fact Sheet for Healthcare  Providers: SeriousBroker.it  This test is not yet approved or cleared by the [place]  FDA and has been authorized for detection and/or diagnosis of SARS-CoV-2 by FDA under an Emergency Use Authorization (EUA). This EUA will remain in effect (meaning this test can be used) for the duration of the COVID-19 declaration under Section 564(b)(1) of the Act, 21 U.S.C. section 360bbb-3(b)(1), unless the authorization is terminated or revoked.  Performed at [facility], [address] [place],  [address]      Medications:    apixaban   2.5 mg Oral BID   cyanocobalamin   1,000 mcg Oral Daily   diltiazem   180 mg Oral Daily   gabapentin   300 mg Oral Daily   levalbuterol   0.63 mg Nebulization Q6H   levothyroxine   50 mcg Oral QAC breakfast   magnesium  gluconate  500 mg Oral Daily   pantoprazole   40 mg Oral BID   Continuous Infusions:  azithromycin  500 mg (08/28/23 2221)   cefTRIAXone  (ROCEPHIN )  IV 1 g (08/28/23 2219)      LOS: 1 day   Erle Odell Castor  [facility]  08/29/2023, 10:08 AM

## 2023-08-29 NOTE — Progress Notes (Signed)
 Mobility Specialist: Progress Note   08/29/23 1606  Mobility  Activity Ambulated with assistance  Level of Assistance Contact guard assist, steadying assist  Assistive Device Other (Comment) (HHA)  Distance Ambulated (ft) 500 ft  Activity Response Tolerated well  Mobility Referral Yes  Mobility visit 1 Mobility  Mobility Specialist Start Time (ACUTE ONLY) 1237  Mobility Specialist Stop Time (ACUTE ONLY) 1242  Mobility Specialist Time Calculation (min) (ACUTE ONLY) 5 min    Pt received in bed, agreeable to mobility session. No complaints. CGA w/ light HHA. SpO2 95% on RA. Returned to room without fault. Left on EOB with all needs met, call bell in reach. Family in room.   Ileana Lute Mobility Specialist Please contact via SecureChat or Rehab office at [phone]

## 2023-08-29 NOTE — Plan of Care (Signed)

## 2023-08-29 NOTE — Progress Notes (Signed)
 Resp panel 20 ~pathogens hasn't been processed yet. Spoke with Darquitta, microbiology tech. She advised will run panel on existing tube from other resp panel done. Should take a few hours.

## 2023-08-29 NOTE — Evaluation (Signed)
 Physical Therapy Evaluation Patient Details Name: Kathleen Santana MRN: [id number] DOB: Jun 17, 1926 Today's Date: 08/29/2023  History of Present Illness  Patient is a 89 year old female with acute respiratory failure with hypoxia. PMH: chronic atrial fibrillation, hypothyroidism  Clinical Impression  Patient is agreeable to PT evaluation. She lives alone typically, is independent with mobility with occasional use of walking pole/stick, drives, and volunteers. She reports she could stay at her daughter's home initially if needed.   No physical assistance required today with bed mobility. Patient able to stand and ambulate in the hallway with hand held assistance. Mild dyspnea with exertion with Sp02 97% on room air. Heart rate in the 120's with activity. She does report feeling generalized weakness compared to baseline. Recommend PT follow up while in the hospital to maximize independence and decrease caregiver burden. She is hopeful for discharge home soon.       If plan is discharge home, recommend the following: Assist for transportation;Help with stairs or ramp for entrance   Can travel by private vehicle        Equipment Recommendations None recommended by PT  Recommendations for Other Services       Functional Status Assessment Patient has had a recent decline in their functional status and demonstrates the ability to make significant improvements in function in a reasonable and predictable amount of time.     Precautions / Restrictions Precautions Precautions: Fall Recall of Precautions/Restrictions: Intact Restrictions Weight Bearing Restrictions Per Provider Order: No      Mobility  Bed Mobility Overal bed mobility: Modified Independent                  Transfers Overall transfer level: Needs assistance Equipment used: None Transfers: Sit to/from Stand Sit to Stand: Contact guard assist                Ambulation/Gait Ambulation/Gait assistance:  Contact guard assist Gait Distance (Feet): 200 Feet Assistive device: 1 person hand held assist Gait Pattern/deviations: Step-through pattern Gait velocity: decreased     General Gait Details: mild unsteadiness initially with ambulation that is self corrected. mild dyspnea with exertion with Sp02 97% on room air. education on energy conservation strategies  Stairs            Wheelchair Mobility     Tilt Bed    Modified Rankin (Stroke Patients Only)       Balance Overall balance assessment: Needs assistance Sitting-balance support: Feet supported Sitting balance-Leahy Scale: Good     Standing balance support: Single extremity supported Standing balance-Leahy Scale: Fair                               Pertinent Vitals/Pain Pain Assessment Pain Assessment: No/denies pain    Home Living Family/patient expects to be discharged to:: Private residence Living Arrangements: Alone Available Help at Discharge: Family;Available PRN/intermittently Type of Home: House Home Access: Stairs to enter Entrance Stairs-Rails: None Entrance Stairs-Number of Steps: 1   Home Layout: One level Home Equipment: Agricultural consultant (2 wheels);Grab bars - tub/shower;Shower seat (walking poles)      Prior Function Prior Level of Function : Independent/Modified Independent;Driving             Mobility Comments: independent. uses walking poles for ambulation intermittently. drives and volunteers ADLs Comments: independent     Extremity/Trunk Assessment   Upper Extremity Assessment Upper Extremity Assessment: Overall WFL for tasks assessed  Lower Extremity Assessment Lower Extremity Assessment: Generalized weakness (endurance impaired for sustained activity)       Communication   Communication Communication: Impaired Factors Affecting Communication: Hearing impaired (hearing aides)    Cognition Arousal: Alert Behavior During Therapy: WFL for tasks  assessed/performed   PT - Cognitive impairments: No apparent impairments                         Following commands: Intact       Cueing Cueing Techniques: Verbal cues     General Comments General comments (skin integrity, edema, etc.): patient is eager to return home. she reports she can stay at her daughter's home for supervision/assistance from family as needed    Exercises     Assessment/Plan    PT Assessment Patient needs continued PT services  PT Problem List Decreased strength;Decreased activity tolerance;Decreased balance;Decreased mobility       PT Treatment Interventions DME instruction;Gait training;Stair training;Functional mobility training;Therapeutic activities;Therapeutic exercise;Balance training;Neuromuscular re-education;Cognitive remediation    PT Goals (Current goals can be found in the Care Plan section)  Acute Rehab PT Goals Patient Stated Goal: home PT Goal Formulation: With patient Time For Goal Achievement: 09/12/23 Potential to Achieve Goals: Good    Frequency Min 2X/week     Co-evaluation               AM-PAC PT 6 Clicks Mobility  Outcome Measure Help needed turning from your back to your side while in a flat bed without using bedrails?: None Help needed moving from lying on your back to sitting on the side of a flat bed without using bedrails?: A Little Help needed moving to and from a bed to a chair (including a wheelchair)?: A Little Help needed standing up from a chair using your arms (e.g., wheelchair or bedside chair)?: A Little Help needed to walk in hospital room?: A Little Help needed climbing 3-5 steps with a railing? : A Little 6 Click Score: 19    End of Session   Activity Tolerance: Patient tolerated treatment well Patient left: in bed;with bed alarm set;with call bell/phone within reach (seated on bed per patient preference)   PT Visit Diagnosis: Muscle weakness (generalized) (M62.81);Unsteadiness on  feet (R26.81)    Time: 9168-9097 PT Time Calculation (min) (ACUTE ONLY): 31 min   Charges:   PT Evaluation $PT Eval Moderate Complexity: 1 Mod   PT General Charges $$ ACUTE PT VISIT: 1 Visit         Kathleen Santana, PT, MPT   Kathleen Santana 08/29/2023, 10:04 AM

## 2023-08-30 ENCOUNTER — Encounter (HOSPITAL_COMMUNITY)

## 2023-08-30 DIAGNOSIS — J9601 Acute respiratory failure with hypoxia: Secondary | ICD-10-CM | POA: Diagnosis not present

## 2023-08-30 DIAGNOSIS — I071 Rheumatic tricuspid insufficiency: Secondary | ICD-10-CM | POA: Diagnosis not present

## 2023-08-30 DIAGNOSIS — I5031 Acute diastolic (congestive) heart failure: Secondary | ICD-10-CM | POA: Diagnosis not present

## 2023-08-30 LAB — BASIC METABOLIC PANEL WITH GFR
Anion gap: 7 (ref 5–15)
BUN: 23 mg/dL (ref 8–23)
CO2: 23 mmol/L (ref 22–32)
Calcium: 8.6 mg/dL — ABNORMAL LOW (ref 8.9–10.3)
Chloride: 105 mmol/L (ref 98–111)
Creatinine, Ser: 1.5 mg/dL — ABNORMAL HIGH (ref 0.44–1.00)
GFR, Estimated: 32 mL/min — ABNORMAL LOW (ref >60)
Glucose, Bld: 93 mg/dL (ref 70–99)
Potassium: 4.1 mmol/L (ref 3.5–5.1)
Sodium: 135 mmol/L (ref 135–145)

## 2023-08-30 LAB — CBC
HCT: 30.8 % — ABNORMAL LOW (ref 36.0–46.0)
Hemoglobin: 10.1 g/dL — ABNORMAL LOW (ref 12.0–15.0)
MCH: 29.8 pg (ref 26.0–34.0)
MCHC: 32.8 g/dL (ref 30.0–36.0)
MCV: 90.9 fL (ref 80.0–100.0)
Platelets: 225 K/uL (ref 150–400)
RBC: 3.39 MIL/uL — ABNORMAL LOW (ref 3.87–5.11)
RDW: 16.8 % — ABNORMAL HIGH (ref 11.5–15.5)
WBC: 6.2 K/uL (ref 4.0–10.5)
nRBC: 0 % (ref 0.0–0.2)

## 2023-08-30 NOTE — TOC CM/SW Note (Signed)
 Referral received to arrange [facility] PT/OT. Met with pt and daughter. Pt plans to return home with the support of her daughter. Daughter will provide transportation at time of DC. Discussed HH therapy. They agreed with [facility]. They don't have a preference for a HH agency. Discussed CMS Medicare compare  list. They agreed to use [facility]. Contacted Cheryl at [facility] and she accepted the referral.

## 2023-08-30 NOTE — Discharge Summary (Signed)
 Physician Discharge Summary  Kathleen Santana FMW:[id number] DOB: 13-Aug-1926 DOA: 08/27/2023  PCP: Clarice Nottingham, MD  Admit date: 08/27/2023 Discharge date: 08/30/2023  Admitted From: Home Disposition:  Home  Recommendations for Outpatient Follow-up:  Follow up with PCP in 1-2 weeks   Home Health:No Equipment/Devices:None  Discharge Condition:Stable CODE STATUS:Full Diet recommendation: Heart Healthy  Brief/Interim Summary: 89 y.o. 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   Discharge Diagnoses:  Principal Problem:   Acute respiratory failure with hypoxia (HCC) Active Problems:   Acute diastolic heart failure (HCC)   Pneumonia   Severe tricuspid valve regurgitation   Paroxysmal atrial fibrillation (HCC)   Essential hypertension   Hypothyroidism   Chronic kidney disease, stage III (moderate) (HCC)   Renal failure (ARF), acute on chronic (HCC)   Anemia  Acute respiratory failure with hypoxia: 2D echo showed an EF of 55% moderate MR. Chest x-ray showed small pleural effusion with minimal patchy opacity BNP was 200. SARS-CoV-2 influenza PCR and RSV was negative. Respiratory panel for 24 pathogens was negative. Twelve-lead EKG showed no evidence of ischemia A-fib rate controlled. Procalcitonin was low yield. CT scan of the chest showed mild interstitial thickening of the lungs and bilateral small pleural effusions. New 3 mm nodule. Initially she was started on empiric antibiotics which were discontinued. She initially received IV Lasix  in the ED this was discontinued, she appeared euvolemic on physical exam she was started on IV fluids,  and  We were able to wean him to room air within 24 hours she appeared euvolemic on physical exam. Question of hypotension and hypovolemia are probably contributing to her sensation of dyspnea.  Acute kidney injury on chronic kidney disease stage IIIa: With a baseline creatinine of 1.1 on admission of 2. She was started on aggressive IV fluids her creatinine improved this morning to 1.5. She will go home off Lasix  follow-up with PCP as an outpatient.  History of A-fib: Rate controlled continue Cardizem  and Eliquis  no changes made.  Hypothyroidism: Continue Synthroid .  Anemia chronic renal disease: Hemoglobin stable.  Hypovolemic hyponatremia: Resolved with IV fluids.  Discharge Instructions  Discharge Instructions     Diet - low sodium heart healthy   Complete by: As directed    Increase activity slowly   Complete by: As directed       Allergies as of 08/30/2023       Reactions   Nickel Rash   Norco [hydrocodone-acetaminophen ] Nausea And Vomiting, Other (See Comments)   Passed out, dizziness   Hydrocodone Other (See Comments)   Other Other (See Comments), Cough   Seasonal allergies- congestion and itchy eyes, also        Medication List     STOP taking these medications    diltiazem  240 MG 24 hr capsule Commonly known as: TIAZAC    diltiazem  30 MG tablet Commonly known as: Cardizem    furosemide  20 MG tablet Commonly known as: Lasix        TAKE these medications    albuterol  108 (90 Base) MCG/ACT inhaler Commonly known as: Ventolin  HFA Inhale 2 puffs into the lungs every 4 (four) hours as needed for wheezing or shortness of breath.   apixaban  2.5 MG Tabs tablet Commonly known as: Eliquis   Take 1 tablet (2.5 mg total) by mouth 2 (two) times daily.   cholecalciferol 25 MCG (1000 UNIT) tablet Commonly known as: VITAMIN D3 Take 1,000 Units by mouth daily.   cyanocobalamin  1000 MCG tablet Commonly known as: VITAMIN B12 Take 1,000 mcg by mouth daily.    diltiazem  180 MG 24 hr capsule Commonly known as: CARDIZEM  CD Take 180 mg by mouth daily.   gabapentin  300 MG capsule Commonly known as: NEURONTIN  Take 300 mg by mouth 2 (two) times daily.   gabapentin  100 MG capsule Commonly known as: NEURONTIN  1-2 capsules at bedtime or when the right thigh pain is worse, in addition to the baseline gabapentin  Orally Once a day; Duration: 30 days   ipratropium 0.06 % nasal spray Commonly known as: ATROVENT  Place 1 spray into both nostrils 3 (three) times daily as needed for rhinitis.   levothyroxine  50 MCG tablet Commonly known as: SYNTHROID  Take 50 mcg by mouth daily before breakfast.   magnesium  gluconate 500 (27 Mg) MG Tabs tablet Commonly known as: MAGONATE Take 500 mg by mouth daily.   multivitamin with minerals Tabs tablet Take 1 tablet by mouth daily with breakfast. One A Day for Women   pantoprazole  40 MG tablet Commonly known as: PROTONIX  Take 40 mg by mouth 2 (two) times daily.   Systane Complete PF 0.6 % Soln Generic drug: Propylene Glycol (PF) Place 1 drop into both eyes 3 (three) times daily as needed (dry/irritated eyes.).        Allergies  Allergen Reactions   Nickel Rash   Norco [Hydrocodone-Acetaminophen ] Nausea And Vomiting and Other (See Comments)    Passed out, dizziness   Hydrocodone Other (See Comments)   Other Other (See Comments) and Cough    Seasonal allergies- congestion and itchy eyes, also    Consultations: None   Procedures/Studies: CT CHEST WO CONTRAST Result Date: 08/28/2023 EXAM: CT CHEST WITHOUT CONTRAST 08/28/2023 07:10:00 AM TECHNIQUE: CT of the chest was performed without the administration of intravenous contrast. Multiplanar reformatted images are provided for review. Automated exposure control, iterative reconstruction, and/or weight based adjustment of the mA/kV was utilized to reduce the radiation dose to as low as reasonably achievable. COMPARISON: 09/05/2023 CLINICAL HISTORY: COPD  suspected. SOB. COPD. Abnormal chest x ray. FINDINGS: MEDIASTINUM: Mild cardiac enlargement. No pericardial effusion. Aortic atherosclerosis and coronary artery calcifications. Increased caliber of the main pulmonary artery suggests PA hypertension. LYMPH NODES: No mediastinal, hilar or axillary lymphadenopathy. LUNGS AND PLEURA: Small bilateral pleural effusions, right greater than left. No pneumothorax. Diffuse bronchial wall thickening Mild interstitial thickening within the lung bases suggests early edema. No pneumothorax or airspace consolidation. There are several scattered lung nodules identified. Within the left upper lobe there is a new 2 mm lung nodule, image 38/4. Also in the left upper lobe is a new 3 mm peripheral nodule, image 69/4. SOFT TISSUES/BONES: No acute osseous findings. Multilevel lumbar degenerative disc disease. Thoracolumbar scoliosis as before. UPPER ABDOMEN: No acute abnormality within the imaged portions of the upper abdomen. Cyst within the left kidney is partially visualized measuring 4.1 cm. Several additional left kidney cysts are also noted. No specific follow up imaging of these renal cysts is recommended unless clinically indicated. IMPRESSION: 1. Mild interstitial thickening within the lung bases, suggestive of early edema. Small bilateral pleural effusions, right greater than left. Correlate for any clinical signs or symptoms suggestive of congestive heart failure. 2. Diffuse bronchial wall thickening which may be seen in the setting of chronic obstructive pulmonary disease.  3. New 2 mm and 3 mm nodules in the left upper lobe. If the patient is at low risk, no further follow-up imaging is recommended. In a patient that is at increased risk follow-up imaging at 12 months may be considered. 4. Increased caliber of the main pulmonary artery, suggestive of PA hypertension. Electronically signed by: Waddell Calk MD 08/28/2023 07:27 AM EDT RP Workstation: [id number]   DG Chest 2  View Result Date: 08/27/2023 CLINICAL DATA:  Shortness of breath EXAM: CHEST - 2 VIEW COMPARISON:  Chest x-ray 05/09/2023 FINDINGS: The heart is enlarged. There are atherosclerotic calcifications of the aorta. There is a small right pleural effusion. There are minimal patchy opacities in the anterior left upper lobe. The lungs are otherwise clear. There is no pneumothorax or acute fracture. IMPRESSION: 1. Cardiomegaly. 2. Small right pleural effusion. 3. Minimal patchy opacities in the anterior left upper lobe, atelectasis versus infection. Electronically Signed   By: Greig Pique M.D.   On: 08/27/2023 23:36   (Echo, Carotid, EGD, Colonoscopy, ERCP)    Subjective: No complain  Discharge Exam: Vitals:   08/30/23 0456 08/30/23 0819  BP: 130/84 (!) 143/96  Pulse: 82 93  Resp: 20 20  Temp: 97.6 F (36.4 C) 97.6 F (36.4 C)  SpO2: 96% 99%   Vitals:   08/29/23 2041 08/29/23 2342 08/30/23 0456 08/30/23 0819  BP: (!) 149/74 132/82 130/84 (!) 143/96  Pulse: (!) 58 89 82 93  Resp: 20 20 20 20   Temp: 98.1 F (36.7 C) 98 F (36.7 C) 97.6 F (36.4 C) 97.6 F (36.4 C)  TempSrc: Oral Oral Oral   SpO2: 97% 98% 96% 99%  Weight:   51.5 kg   Height:        General: Pt is alert, awake, not in acute distress Cardiovascular: RRR, S1/S2 +, no rubs, no gallops Respiratory: CTA bilaterally, no wheezing, no rhonchi Abdominal: Soft, NT, ND, bowel sounds + Extremities: no edema, no cyanosis    The results of significant diagnostics from this hospitalization (including imaging, microbiology, ancillary and laboratory) are listed below for reference.     Microbiology: Recent Results (from the past 240 hours)  Resp panel by RT-PCR (RSV, Flu A&B, Covid) Anterior Nasal Swab     Status: None   Collection Time: 08/27/23 11:05 PM   Specimen: Anterior Nasal Swab  Result Value Ref Range Status   SARS Coronavirus 2 by RT PCR NEGATIVE NEGATIVE Final   Influenza A by PCR NEGATIVE NEGATIVE Final    Influenza B by PCR NEGATIVE NEGATIVE Final    Comment: (NOTE) The Xpert Xpress SARS-CoV-2/FLU/RSV plus assay is intended as an aid in the diagnosis of influenza from Nasopharyngeal swab specimens and should not be used as a sole basis for treatment. Nasal washings and aspirates are unacceptable for Xpert Xpress SARS-CoV-2/FLU/RSV testing.  Fact Sheet for Patients: BloggerCourse.com  Fact Sheet for Healthcare Providers: SeriousBroker.it  This test is not yet approved or cleared by the [place]  FDA and has been authorized for detection and/or diagnosis of SARS-CoV-2 by FDA under an Emergency Use Authorization (EUA). This EUA will remain in effect (meaning this test can be used) for the duration of the COVID-19 declaration under Section 564(b)(1) of the Act, 21 U.S.C. section 360bbb-3(b)(1), unless the authorization is terminated or revoked.     Resp Syncytial Virus by PCR NEGATIVE NEGATIVE Final    Comment: (NOTE) Fact Sheet for Patients: BloggerCourse.com  Fact Sheet for Healthcare Providers: SeriousBroker.it  This test is not yet  approved or cleared by the [place]  FDA and has been authorized for detection and/or diagnosis of SARS-CoV-2 by FDA under an Emergency Use Authorization (EUA). This EUA will remain in effect (meaning this test can be used) for the duration of the COVID-19 declaration under Section 564(b)(1) of the Act, 21 U.S.C. section 360bbb-3(b)(1), unless the authorization is terminated or revoked.  Performed at [facility], [address] [place], [place] [address]   Respiratory (~20 pathogens) panel by PCR     Status: None   Collection Time: 08/28/23  5:05 AM   Specimen: Nasopharyngeal Swab; Respiratory  Result Value Ref Range Status   Adenovirus NOT DETECTED NOT DETECTED Final   Coronavirus 229E NOT DETECTED NOT DETECTED Final    Comment:  (NOTE) The Coronavirus on the Respiratory Panel, DOES NOT test for the novel  Coronavirus (2019 nCoV)    Coronavirus HKU1 NOT DETECTED NOT DETECTED Final   Coronavirus NL63 NOT DETECTED NOT DETECTED Final   Coronavirus OC43 NOT DETECTED NOT DETECTED Final   Metapneumovirus NOT DETECTED NOT DETECTED Final   Rhinovirus / Enterovirus NOT DETECTED NOT DETECTED Final   Influenza A NOT DETECTED NOT DETECTED Final   Influenza B NOT DETECTED NOT DETECTED Final   Parainfluenza Virus 1 NOT DETECTED NOT DETECTED Final   Parainfluenza Virus 2 NOT DETECTED NOT DETECTED Final   Parainfluenza Virus 3 NOT DETECTED NOT DETECTED Final   Parainfluenza Virus 4 NOT DETECTED NOT DETECTED Final   Respiratory Syncytial Virus NOT DETECTED NOT DETECTED Final   Bordetella pertussis NOT DETECTED NOT DETECTED Final   Bordetella Parapertussis NOT DETECTED NOT DETECTED Final   Chlamydophila pneumoniae NOT DETECTED NOT DETECTED Final   Mycoplasma pneumoniae NOT DETECTED NOT DETECTED Final    Comment: Performed at [facility], [address] [place], [place] [address]     Labs: BNP (last 3 results) Recent Labs    05/06/23 1153 05/08/23 0605 08/27/23 2305  BNP 227.8* 285.1* 181.2*   Basic Metabolic Panel: Recent Labs  Lab 08/27/23 2305 08/28/23 0417 08/29/23 0318 08/30/23 0351  NA 132* 133* 136 135  K 4.2 4.1 4.3 4.1  CL 101 102 106 105  CO2 19* 21* 23 23  GLUCOSE 164* 173* 102* 93  BUN 30* 28* 27* 23  CREATININE 1.89* 2.00* 1.77* 1.50*  CALCIUM 8.9 8.5* 8.4* 8.6*  MG  --  2.2  --   --    Liver Function Tests: Recent Labs  Lab 08/28/23 0417  AST 85*  ALT 58*  ALKPHOS 102  BILITOT 0.5  PROT 7.3  ALBUMIN 3.3*   No results for input(s): LIPASE, AMYLASE in the last 168 hours. No results for input(s): AMMONIA in the last 168 hours. CBC: Recent Labs  Lab 08/27/23 2305 08/28/23 0417 08/30/23 0351  WBC 9.5 9.8 6.2  NEUTROABS 8.0* 8.4*  --   HGB 11.3* 10.5* 10.1*  HCT 34.3*  32.3* 30.8*  MCV 90.5 90.2 90.9  PLT 238 249 225   Cardiac Enzymes: No results for input(s): CKTOTAL, CKMB, CKMBINDEX, TROPONINI in the last 168 hours. BNP: Invalid input(s): POCBNP CBG: No results for input(s): GLUCAP in the last 168 hours. D-Dimer No results for input(s): DDIMER in the last 72 hours. Hgb A1c No results for input(s): HGBA1C in the last 72 hours. Lipid Profile No results for input(s): CHOL, HDL, LDLCALC, TRIG, CHOLHDL, LDLDIRECT in the last 72 hours. Thyroid function studies Recent Labs    08/28/23 0417  TSH 2.633  Anemia work up Recent Labs    08/28/23 0830  VITAMINB12 1,798*  FOLATE 28.3   Urinalysis    Component Value Date/Time   COLORURINE YELLOW 08/28/2023 0638   APPEARANCEUR CLEAR 08/28/2023 0638   LABSPEC 1.008 08/28/2023 0638   PHURINE 5.0 08/28/2023 0638   GLUCOSEU NEGATIVE 08/28/2023 0638   HGBUR NEGATIVE 08/28/2023 0638   BILIRUBINUR NEGATIVE 08/28/2023 0638   KETONESUR NEGATIVE 08/28/2023 0638   PROTEINUR NEGATIVE 08/28/2023 0638   NITRITE NEGATIVE 08/28/2023 0638   LEUKOCYTESUR NEGATIVE 08/28/2023 0638   Sepsis Labs Recent Labs  Lab 08/27/23 2305 08/28/23 0417 08/30/23 0351  WBC 9.5 9.8 6.2   Microbiology Recent Results (from the past 240 hours)  Resp panel by RT-PCR (RSV, Flu A&B, Covid) Anterior Nasal Swab     Status: None   Collection Time: 08/27/23 11:05 PM   Specimen: Anterior Nasal Swab  Result Value Ref Range Status   SARS Coronavirus 2 by RT PCR NEGATIVE NEGATIVE Final   Influenza A by PCR NEGATIVE NEGATIVE Final   Influenza B by PCR NEGATIVE NEGATIVE Final    Comment: (NOTE) The Xpert Xpress SARS-CoV-2/FLU/RSV plus assay is intended as an aid in the diagnosis of influenza from Nasopharyngeal swab specimens and should not be used as a sole basis for treatment. Nasal washings and aspirates are unacceptable for Xpert Xpress SARS-CoV-2/FLU/RSV testing.  Fact Sheet for  Patients: BloggerCourse.com  Fact Sheet for Healthcare Providers: SeriousBroker.it  This test is not yet approved or cleared by the [place]  FDA and has been authorized for detection and/or diagnosis of SARS-CoV-2 by FDA under an Emergency Use Authorization (EUA). This EUA will remain in effect (meaning this test can be used) for the duration of the COVID-19 declaration under Section 564(b)(1) of the Act, 21 U.S.C. section 360bbb-3(b)(1), unless the authorization is terminated or revoked.     Resp Syncytial Virus by PCR NEGATIVE NEGATIVE Final    Comment: (NOTE) Fact Sheet for Patients: BloggerCourse.com  Fact Sheet for Healthcare Providers: SeriousBroker.it  This test is not yet approved or cleared by the [place]  FDA and has been authorized for detection and/or diagnosis of SARS-CoV-2 by FDA under an Emergency Use Authorization (EUA). This EUA will remain in effect (meaning this test can be used) for the duration of the COVID-19 declaration under Section 564(b)(1) of the Act, 21 U.S.C. section 360bbb-3(b)(1), unless the authorization is terminated or revoked.  Performed at [facility], [address] [place], [place] [address]   Respiratory (~20 pathogens) panel by PCR     Status: None   Collection Time: 08/28/23  5:05 AM   Specimen: Nasopharyngeal Swab; Respiratory  Result Value Ref Range Status   Adenovirus NOT DETECTED NOT DETECTED Final   Coronavirus 229E NOT DETECTED NOT DETECTED Final    Comment: (NOTE) The Coronavirus on the Respiratory Panel, DOES NOT test for the novel  Coronavirus (2019 nCoV)    Coronavirus HKU1 NOT DETECTED NOT DETECTED Final   Coronavirus NL63 NOT DETECTED NOT DETECTED Final   Coronavirus OC43 NOT DETECTED NOT DETECTED Final   Metapneumovirus NOT DETECTED NOT DETECTED Final   Rhinovirus / Enterovirus NOT DETECTED NOT  DETECTED Final   Influenza A NOT DETECTED NOT DETECTED Final   Influenza B NOT DETECTED NOT DETECTED Final   Parainfluenza Virus 1 NOT DETECTED NOT DETECTED Final   Parainfluenza Virus 2 NOT DETECTED NOT DETECTED Final   Parainfluenza Virus 3 NOT DETECTED NOT DETECTED Final   Parainfluenza Virus 4 NOT DETECTED  NOT DETECTED Final   Respiratory Syncytial Virus NOT DETECTED NOT DETECTED Final   Bordetella pertussis NOT DETECTED NOT DETECTED Final   Bordetella Parapertussis NOT DETECTED NOT DETECTED Final   Chlamydophila pneumoniae NOT DETECTED NOT DETECTED Final   Mycoplasma pneumoniae NOT DETECTED NOT DETECTED Final    Comment: Performed at [facility], [address] [place], [place] [address]     Time coordinating discharge: Over 35 minutes  SIGNED:   Erle Odell Castor, MD  [facility] 08/30/2023, 8:33 AM Pager   If 7PM-7AM, please contact night-coverage [email or web address] Password [email or web address]

## 2023-09-01 ENCOUNTER — Ambulatory Visit: Admitting: Pulmonary Disease

## 2023-09-01 ENCOUNTER — Encounter: Payer: Self-pay | Admitting: Pulmonary Disease

## 2023-09-01 VITALS — BP 141/85 | HR 110 | Temp 97.7°F | Ht 62.0 in | Wt 112.8 lb

## 2023-09-01 DIAGNOSIS — J90 Pleural effusion, not elsewhere classified: Secondary | ICD-10-CM

## 2023-09-01 DIAGNOSIS — R911 Solitary pulmonary nodule: Secondary | ICD-10-CM

## 2023-09-01 NOTE — Progress Notes (Signed)
 @Patient  ID: Kathleen Santana, female    DOB: Jul 31, 1926, 89 y.o.   MRN: [id number]  Chief Complaint  Patient presents with   Consult    Pleural effusion    Referring provider: Clarice Nottingham, MD  HPI:   89 y.o. woman whom are seen for evaluation of abnormal chest imaging, pleural effusion.  Multiple hospital notes including recent discharge summary reviewed.  Multiple prior cardiology notes reviewed.  Patient and daughter describe the onset of shortness of breath earlier in the month.  They were instructed to present to the ED.  She had increased weakness in the hours preceding which is often a telltale sign of A-fib with RVR in her experience.  In the ED she was found to be hypoxemic and pressed on oxygen.  She had a chest x-ray that showed small bilateral right greater than left pleural effusions otherwise clear lungs on my review interpretation.  She had a CT scan of the chest that shows small right greater than left bilateral effusions with scattered interlobular septal thickening on my review interpretation.  Concerning for pulmonary venous hypertension on my review and interpretation.  She received a dose of IV Lasix  in the ED.  She was liberated from oxygen and breathing improved.  She had AKI and received conservative IV fluids over the next couple days and creatinine improved.  She was discharged home without oxygen and feeling well from a breathing standpoint.  This was just a few days ago.  She continued to feel well.  Breathing is doing fine.  We discussed at length the likely etiology of her pleural effusions as pulmonary venous hypertension as evidenced by left atrial dilation on prior TTE, bilateral nature of effusions, symptomatic improvement after Lasix .  Discussed possible asymmetric pleural effusion related to MVR and direct of regurgitant jet.  Discussed that small size may be too small for tapping.  We did discuss thoracentesis proactively.  After shared decision making she  stated her preference to avoid invasive procedures at this time.  We discussed considering thoracentesis in the future depending on recurrence and symptoms in the future.  We discussed incidentally noted 2 and 3 mm nodules in the left upper lobe.  Given her age she is intermediate risk.  We discussed a repeat CT scan at 1 year per Fleischner criteria which she agrees to.  Questionaires / Pulmonary Flowsheets:   ACT:      No data to display          MMRC:     No data to display          Epworth:      No data to display          Tests:   FENO:  No results found for: NITRICOXIDE  PFT:     No data to display          WALK:      No data to display          Imaging: Personally viewed and as per EMR in discussion this note CT CHEST WO CONTRAST Result Date: 08/28/2023 EXAM: CT CHEST WITHOUT CONTRAST 08/28/2023 07:10:00 AM TECHNIQUE: CT of the chest was performed without the administration of intravenous contrast. Multiplanar reformatted images are provided for review. Automated exposure control, iterative reconstruction, and/or weight based adjustment of the mA/kV was utilized to reduce the radiation dose to as low as reasonably achievable. COMPARISON: 09/05/2023 CLINICAL HISTORY: COPD suspected. SOB. COPD. Abnormal chest x ray. FINDINGS: MEDIASTINUM: Mild cardiac  enlargement. No pericardial effusion. Aortic atherosclerosis and coronary artery calcifications. Increased caliber of the main pulmonary artery suggests PA hypertension. LYMPH NODES: No mediastinal, hilar or axillary lymphadenopathy. LUNGS AND PLEURA: Small bilateral pleural effusions, right greater than left. No pneumothorax. Diffuse bronchial wall thickening Mild interstitial thickening within the lung bases suggests early edema. No pneumothorax or airspace consolidation. There are several scattered lung nodules identified. Within the left upper lobe there is a new 2 mm lung nodule, image 38/4. Also in the  left upper lobe is a new 3 mm peripheral nodule, image 69/4. SOFT TISSUES/BONES: No acute osseous findings. Multilevel lumbar degenerative disc disease. Thoracolumbar scoliosis as before. UPPER ABDOMEN: No acute abnormality within the imaged portions of the upper abdomen. Cyst within the left kidney is partially visualized measuring 4.1 cm. Several additional left kidney cysts are also noted. No specific follow up imaging of these renal cysts is recommended unless clinically indicated. IMPRESSION: 1. Mild interstitial thickening within the lung bases, suggestive of early edema. Small bilateral pleural effusions, right greater than left. Correlate for any clinical signs or symptoms suggestive of congestive heart failure. 2. Diffuse bronchial wall thickening which may be seen in the setting of chronic obstructive pulmonary disease. 3. New 2 mm and 3 mm nodules in the left upper lobe. If the patient is at low risk, no further follow-up imaging is recommended. In a patient that is at increased risk follow-up imaging at 12 months may be considered. 4. Increased caliber of the main pulmonary artery, suggestive of PA hypertension. Electronically signed by: Waddell Calk MD 08/28/2023 07:27 AM EDT RP Workstation: [id number]   DG Chest 2 View Result Date: 08/27/2023 CLINICAL DATA:  Shortness of breath EXAM: CHEST - 2 VIEW COMPARISON:  Chest x-ray 05/09/2023 FINDINGS: The heart is enlarged. There are atherosclerotic calcifications of the aorta. There is a small right pleural effusion. There are minimal patchy opacities in the anterior left upper lobe. The lungs are otherwise clear. There is no pneumothorax or acute fracture. IMPRESSION: 1. Cardiomegaly. 2. Small right pleural effusion. 3. Minimal patchy opacities in the anterior left upper lobe, atelectasis versus infection. Electronically Signed   By: Greig Pique M.D.   On: 08/27/2023 23:36    Lab Results: Personally reviewed CBC    Component Value Date/Time    WBC 6.2 08/30/2023 0351   RBC 3.39 (L) 08/30/2023 0351   HGB 10.1 (L) 08/30/2023 0351   HGB 12.1 05/02/2023 1426   HCT 30.8 (L) 08/30/2023 0351   HCT 37.4 05/02/2023 1426   PLT 225 08/30/2023 0351   PLT 264 05/02/2023 1426   MCV 90.9 08/30/2023 0351   MCV 95 05/02/2023 1426   MCH 29.8 08/30/2023 0351   MCHC 32.8 08/30/2023 0351   RDW 16.8 (H) 08/30/2023 0351   RDW 13.6 05/02/2023 1426   LYMPHSABS 0.7 08/28/2023 0417   MONOABS 0.6 08/28/2023 0417   EOSABS 0.0 08/28/2023 0417   BASOSABS 0.0 08/28/2023 0417    BMET    Component Value Date/Time   NA 135 08/30/2023 0351   NA 136 05/02/2023 1426   K 4.1 08/30/2023 0351   CL 105 08/30/2023 0351   CO2 23 08/30/2023 0351   GLUCOSE 93 08/30/2023 0351   BUN 23 08/30/2023 0351   BUN 25 05/02/2023 1426   CREATININE 1.50 (H) 08/30/2023 0351   CALCIUM 8.6 (L) 08/30/2023 0351   GFRNONAA 32 (L) 08/30/2023 0351   GFRAA 42 (L) 09/06/2019 0514    BNP    Component Value  Date/Time   BNP 181.2 (H) 08/27/2023 2305    ProBNP No results found for: PROBNP  Specialty Problems       Pulmonary Problems   Acute respiratory failure (HCC)   Bronchitis   Pneumonia   Acute respiratory failure with hypoxia (HCC)    Allergies  Allergen Reactions   Nickel Rash   Norco [Hydrocodone-Acetaminophen ] Nausea And Vomiting and Other (See Comments)    Passed out, dizziness   Hydrocodone Other (See Comments)   Other Other (See Comments) and Cough    Seasonal allergies- congestion and itchy eyes, also    Immunization History  Administered Date(s) Administered   PFIZER(Purple Top)SARS-COV-2 Vaccination 01/31/2019, 02/22/2019, 10/26/2019   Pfizer(Comirnaty)Fall Seasonal Vaccine 12 years and older 10/23/2021    Past Medical History:  Diagnosis Date   Afib (HCC)    Arrhythmia    COVID-19    2022   GERD (gastroesophageal reflux disease)    Hypertension    Hypothyroidism    Lower extremity neuropathy    Osteoporosis    Pneumonia     2021   Rhinitis, non-allergic     Tobacco History: Social History   Tobacco Use  Smoking Status Former   Types: Cigarettes  Smokeless Tobacco Never  Tobacco Comments   Quit smoking in 1971, had been smoking for 16 years.   Counseling given: Not Answered Tobacco comments: Quit smoking in 1971, had been smoking for 16 years.   Continue to not smoke  Outpatient Encounter Medications as of 09/01/2023  Medication Sig   albuterol  (VENTOLIN  HFA) 108 (90 Base) MCG/ACT inhaler Inhale 2 puffs into the lungs every 4 (four) hours as needed for wheezing or shortness of breath.   apixaban  (ELIQUIS ) 2.5 MG TABS tablet Take 1 tablet (2.5 mg total) by mouth 2 (two) times daily.   cholecalciferol (VITAMIN D3) 25 MCG (1000 UNIT) tablet Take 1,000 Units by mouth daily.   cyanocobalamin  (VITAMIN B12) 1000 MCG tablet Take 1,000 mcg by mouth daily.   diltiazem  (CARDIZEM  CD) 180 MG 24 hr capsule Take 180 mg by mouth daily.   gabapentin  (NEURONTIN ) 100 MG capsule 1-2 capsules at bedtime or when the right thigh pain is worse, in addition to the baseline gabapentin  Orally Once a day; Duration: 30 days   gabapentin  (NEURONTIN ) 300 MG capsule Take 300 mg by mouth 2 (two) times daily.   ipratropium (ATROVENT ) 0.06 % nasal spray Place 1 spray into both nostrils 3 (three) times daily as needed for rhinitis.   levothyroxine  (SYNTHROID ) 50 MCG tablet Take 50 mcg by mouth daily before breakfast.   magnesium  gluconate (MAGONATE) 500 (27 Mg) MG TABS tablet Take 500 mg by mouth daily.   Multiple Vitamin (MULTIVITAMIN WITH MINERALS) TABS tablet Take 1 tablet by mouth daily with breakfast. One A Day for Women   pantoprazole  (PROTONIX ) 40 MG tablet Take 40 mg by mouth 2 (two) times daily.   Propylene Glycol, PF, (SYSTANE COMPLETE PF) 0.6 % SOLN Place 1 drop into both eyes 3 (three) times daily as needed (dry/irritated eyes.).   No facility-administered encounter medications on file as of 09/01/2023.     Review of  Systems  Review of Systems  No chest pain with attrition.  No orthopnea or PND.  Comprehensive review of systems otherwise negative. Physical Exam  BP (!) 141/85   Pulse (!) 110   Temp 97.7 F (36.5 C)   Ht 5' 2 (1.575 m)   Wt 112 lb 12.8 oz (51.2 kg)   SpO2 99%  Comment: RA  BMI 20.63 kg/m   Wt Readings from Last 5 Encounters:  09/01/23 112 lb 12.8 oz (51.2 kg)  08/30/23 113 lb 8 oz (51.5 kg)  08/07/23 113 lb 8 oz (51.5 kg)  06/26/23 113 lb 12.8 oz (51.6 kg)  06/13/23 115 lb (52.2 kg)    BMI Readings from Last 5 Encounters:  09/01/23 20.63 kg/m  08/30/23 20.11 kg/m  08/07/23 20.11 kg/m  06/26/23 20.16 kg/m  06/13/23 21.03 kg/m     Physical Exam General: Sitting in chair, no acute distress, appears younger than stated age Eyes: EOMI, no icterus Neck: Supple, no JVP Pulmonary: Clear, relatively symmetric with mild decreased breath sounds in the right base compared to the left base, normal work of breathing Cardiovascular: Trace edema noted to midshin, warm, rhythm is irregularly irregular and rate is tachycardic Abdomen: Nondistended MSK: No synovitis, no joint effusion Neuro: Normal gait, no weakness Psych: Normal mood, full affect  Assessment & Plan:   Pleural effusions: Bilateral right greater than left.  Given bilateral nature likely related to pulmonary venous hypertension.  Suspect asymmetric in the right related to MVR.  Her TTE supports chronic pulmonary venous hypertension with left atrial dilation.  Likely exacerbated from time to time by atrial fibrillation.  She improved symptomatically, came off oxygen with IV Lasix  therapy.  It is likely too small to tap at this time.  Lung exam is relatively clear.  I suspect she could benefit from thoracentesis in the future if symptoms were to recur or pleural effusion was to recur and create symptoms again.  We discussed this at length.  Deferred thoracentesis at this time.  Lung nodules: Tiny 2 and 3 mm left  upper lobe nodules.  Repeat CT scan in 1 year interval per Fleischner criteria.   Return in about 1 year (around 08/31/2024) for f/u Dr. Annella, after CT scan.   Donnice JONELLE Annella, MD 09/01/2023   This appointment required 61 minutes of patient care (this includes precharting, chart review, review of results, face-to-face care, etc.).

## 2023-09-01 NOTE — Patient Instructions (Signed)
 Nice to meet you  I think based on the echocardiogram, the images, and the therapeutic response to Lasix  in the hospital, the most likely source of the fluid is accumulation slowly over time from backup of pressure and fluid from the heart.  Fortunately it is not very much on images.  We discussed thoracentesis which is a procedure to drain the fluid.  For now we will hold off.  If symptoms were to return or worsen I think we should reconsider this.  I would recommend increasing the ipratropium use to morning and night to see if this helps with congestion and cough particular in the mornings.  For the small nodules on CT scan, I am not very worried about these.  But we will repeat a CT scan in 1 year to make sure they are stable.  Return to clinic in 1 year after CT scan for follow-up with Dr. Annella or sooner as needed

## 2023-09-07 ENCOUNTER — Other Ambulatory Visit: Payer: Self-pay | Admitting: Nurse Practitioner

## 2023-09-10 ENCOUNTER — Telehealth: Payer: Self-pay | Admitting: Cardiovascular Disease

## 2023-09-10 ENCOUNTER — Other Ambulatory Visit: Payer: Self-pay

## 2023-09-10 ENCOUNTER — Ambulatory Visit (INDEPENDENT_AMBULATORY_CARE_PROVIDER_SITE_OTHER): Admitting: Otolaryngology

## 2023-09-10 ENCOUNTER — Encounter (INDEPENDENT_AMBULATORY_CARE_PROVIDER_SITE_OTHER): Payer: Self-pay | Admitting: Otolaryngology

## 2023-09-10 VITALS — BP 117/79 | HR 85

## 2023-09-10 DIAGNOSIS — K219 Gastro-esophageal reflux disease without esophagitis: Secondary | ICD-10-CM

## 2023-09-10 DIAGNOSIS — R490 Dysphonia: Secondary | ICD-10-CM | POA: Diagnosis not present

## 2023-09-10 DIAGNOSIS — R0982 Postnasal drip: Secondary | ICD-10-CM

## 2023-09-10 DIAGNOSIS — H6990 Unspecified Eustachian tube disorder, unspecified ear: Secondary | ICD-10-CM

## 2023-09-10 DIAGNOSIS — J3089 Other allergic rhinitis: Secondary | ICD-10-CM

## 2023-09-10 DIAGNOSIS — Z87891 Personal history of nicotine dependence: Secondary | ICD-10-CM

## 2023-09-10 DIAGNOSIS — R0981 Nasal congestion: Secondary | ICD-10-CM | POA: Diagnosis not present

## 2023-09-10 DIAGNOSIS — J383 Other diseases of vocal cords: Secondary | ICD-10-CM

## 2023-09-10 MED ORDER — DILTIAZEM HCL ER COATED BEADS 180 MG PO CP24: 180.0000 mg | ORAL_CAPSULE | Freq: Every day | ORAL | 2 refills | Status: AC

## 2023-09-10 MED ORDER — FLUTICASONE PROPIONATE 50 MCG/ACT NA SUSP: 2.0000 | Freq: Two times a day (BID) | NASAL | 6 refills | Status: AC

## 2023-09-10 NOTE — Telephone Encounter (Signed)
 Pt c/o medication issue:  1. Name of Medication:   diltiazem  (CARDIZEM  CD) 180 MG 24 hr capsule    2. How are you currently taking this medication (dosage and times per day)?  Take 1 capsule (180 mg total) by mouth daily.       3. Are you having a reaction (difficulty breathing--STAT)? No  4. What is your medication issue? Pharmacy is requesting a callback regarding medication needing clarity. Please

## 2023-09-10 NOTE — Patient Instructions (Signed)

## 2023-09-10 NOTE — Progress Notes (Signed)
 ENT CONSULT:  Reason for Consult: chronic dysphonia   HPI: Discussed the use of AI scribe software for clinical note transcription with the patient, who gave verbal consent to proceed.  History of Present Illness Kathleen Santana is a 89 year old female who presents with changes in her voice, chronic hoarseness.  She experiences changes in her voice, noting it becomes deeper, particularly when using ipratropium nasal spray. The depth of her voice sometimes makes it difficult to speak. She experiences occasional pain with swallowing.  She has significant nasal drainage, particularly in the mornings, which she describes as 'terrible' and notes that mucus can drip out if not managed. She recalls using Flonase  years ago but does not remember its effectiveness or any adverse effects. She uses ipratropium nasal spray but is not on any other medications for these symptoms.  She mentions having postnasal drainage for a long time and describes a sensation of fullness in her ears, sometimes accompanied by a cracking sound when swallowing. No history of heartburn or reflux, but she experiences only occasional trouble with swallowing when eating dry meats like chicken.    Records Reviewed:  Pulm office visit Dr [facility] 09/01/23 89 y.o. woman whom are seen for evaluation of abnormal chest imaging, pleural effusion.  Multiple hospital notes including recent discharge summary reviewed.  Multiple prior cardiology notes reviewed.   Patient and daughter describe the onset of shortness of breath earlier in the month.  They were instructed to present to the ED.  She had increased weakness in the hours preceding which is often a telltale sign of A-fib with RVR in her experience.  In the ED she was found to be hypoxemic and pressed on oxygen.  She had a chest x-ray that showed small bilateral right greater than left pleural effusions otherwise clear lungs on my review interpretation.  She had a CT scan of the  chest that shows small right greater than left bilateral effusions with scattered interlobular septal thickening on my review interpretation.  Concerning for pulmonary venous hypertension on my review and interpretation.  She received a dose of IV Lasix  in the ED.  She was liberated from oxygen and breathing improved.  She had AKI and received conservative IV fluids over the next couple days and creatinine improved.  She was discharged home without oxygen and feeling well from a breathing standpoint.  This was just a few days ago.  She continued to feel well.  Breathing is doing fine.  We discussed at length the likely etiology of her pleural effusions as pulmonary venous hypertension as evidenced by left atrial dilation on prior TTE, bilateral nature of effusions, symptomatic improvement after Lasix .  Discussed possible asymmetric pleural effusion related to MVR and direct of regurgitant jet.  Discussed that small size may be too small for tapping.  We did discuss thoracentesis proactively.  After shared decision making she stated her preference to avoid invasive procedures at this time.  We discussed considering thoracentesis in the future depending on recurrence and symptoms in the future.   We discussed incidentally noted 2 and 3 mm nodules in the left upper lobe.  Given her age she is intermediate risk.  We discussed a repeat CT scan at 1 year per Fleischner criteria which she agrees to.  Pleural effusions: Bilateral right greater than left.  Given bilateral nature likely related to pulmonary venous hypertension.  Suspect asymmetric in the right related to MVR.  Her TTE supports chronic pulmonary venous hypertension with left atrial  dilation.  Likely exacerbated from time to time by atrial fibrillation.  She improved symptomatically, came off oxygen with IV Lasix  therapy.  It is likely too small to tap at this time.  Lung exam is relatively clear.  I suspect she could benefit from thoracentesis in the  future if symptoms were to recur or pleural effusion was to recur and create symptoms again.  We discussed this at length.  Deferred thoracentesis at this time.   Lung nodules: Tiny 2 and 3 mm left upper lobe nodules.  Repeat CT scan in 1 year interval per Fleischner criteria.    Past Medical History:  Diagnosis Date   Afib (HCC)    Arrhythmia    COVID-19    2022   GERD (gastroesophageal reflux disease)    Hypertension    Hypothyroidism    Lower extremity neuropathy    Osteoporosis    Pneumonia    2021   Rhinitis, non-allergic     Past Surgical History:  Procedure Laterality Date   BACK SURGERY     CARDIOVERSION N/A 04/08/2023   Procedure: CARDIOVERSION;  Surgeon: Jeffrie Oneil BROCKS, MD;  Location: [facility] INVASIVE CV LAB;  Service: Cardiovascular;  Laterality: N/A;   CARDIOVERSION N/A 05/19/2023   Procedure: CARDIOVERSION;  Surgeon: Mona Vinie BROCKS, MD;  Location: [facility] INVASIVE CV LAB;  Service: Cardiovascular;  Laterality: N/A;   CATARACT EXTRACTION Bilateral    2021   NOSE SURGERY     OPEN REDUCTION INTERNAL FIXATION (ORIF) METACARPAL Left 01/13/2022   Procedure: OPEN REDUCTION INTERNAL FIXATION (ORIF) METACARPAL, LEFT INDEX AND LONG FINGER;  Surgeon: Shari Easter, MD;  Location: [facility] OR;  Service: Orthopedics;  Laterality: Left;  REGIONAL WITH IV SEDATION    Family History  Problem Relation Age of Onset   Arrhythmia Mother    Heart disease Father     Social History:  reports that she has quit smoking. Her smoking use included cigarettes. She has never used smokeless tobacco. She reports that she does not drink alcohol  and does not use drugs.  Allergies:  Allergies  Allergen Reactions   Nickel Rash   Norco [Hydrocodone-Acetaminophen ] Nausea And Vomiting and Other (See Comments)    Passed out, dizziness   Hydrocodone Other (See Comments)   Other Other (See Comments) and Cough    Seasonal allergies- congestion and itchy eyes, also    Medications: I have reviewed the patient's  current medications.  The PMH, PSH, Medications, Allergies, and SH were reviewed and updated.  ROS: Constitutional: Negative for fever, weight loss and weight gain. Cardiovascular: Negative for chest pain and dyspnea on exertion. Respiratory: Is not experiencing shortness of breath at rest. Gastrointestinal: Negative for nausea and vomiting. Neurological: Negative for headaches. Psychiatric: The patient is not nervous/anxious  Blood pressure 117/79, pulse 85, SpO2 96%. There is no height or weight on file to calculate BMI.  PHYSICAL EXAM:  Exam: General: Well-developed, well-nourished Respiratory Respiratory effort: Equal inspiration and expiration without stridor Cardiovascular Peripheral Vascular: Warm extremities with equal color/perfusion Eyes: No nystagmus with equal extraocular motion bilaterally Neuro/Psych/Balance: Patient oriented to person, place, and time; Appropriate mood and affect; Gait is intact with no imbalance; Cranial nerves I-XII are intact Head and Face Inspection: Normocephalic and atraumatic without mass or lesion Palpation: Facial skeleton intact without bony stepoffs Salivary Glands: No mass or tenderness Facial Strength: Facial motility symmetric and full bilaterally ENT Pinna: External ear intact and fully developed External canal: Canal is patent with intact skin Tympanic Membrane: Clear and mobile  External Nose: No scar or anatomic deformity Internal Nose: Septum is relatively straight. No polyp, or purulence. Mucosal edema and erythema present.  Bilateral inferior turbinate hypertrophy.  Lips, Teeth, and gums: Mucosa and teeth intact and viable TMJ: No pain to palpation with full mobility Oral cavity/oropharynx: No erythema or exudate, no lesions present Nasopharynx: No mass or lesion with intact mucosa Hypopharynx: Intact mucosa without pooling of secretions Larynx Glottic: Full true vocal cord mobility without lesion or mass VF  atrophy Supraglottic: Normal appearing epiglottis and AE folds Interarytenoid Space: Moderate pachydermia&edema Subglottic Space: Patent without lesion or edema Neck Neck and Trachea: Midline trachea without mass or lesion Thyroid: No mass or nodularity Lymphatics: No lymphadenopathy  Procedure: Preoperative diagnosis: dysphonia  Postoperative diagnosis:   Same + GERD LPR + VF atrophy  Procedure: Flexible fiberoptic laryngoscopy  Surgeon: Elena Larry, MD  Anesthesia: Topical lidocaine  and Afrin Complications: None Condition is stable throughout exam  Indications and consent:  The patient presents to the clinic with above symptoms. Indirect laryngoscopy view was incomplete. Thus it was recommended that they undergo a flexible fiberoptic laryngoscopy. All of the risks, benefits, and potential complications were reviewed with the patient preoperatively and verbal informed consent was obtained.  Procedure: The patient was seated upright in the clinic. Topical lidocaine  and Afrin were applied to the nasal cavity. After adequate anesthesia had occurred, I then proceeded to pass the flexible telescope into the nasal cavity. The nasal cavity was patent without rhinorrhea or polyp. The nasopharynx was also patent without mass or lesion. The base of tongue was visualized and was normal. There were no signs of pooling of secretions in the piriform sinuses. The true vocal folds were mobile bilaterally. There were no signs of glottic or supraglottic mucosal lesion or mass. There was moderate interarytenoid pachydermia and post cricoid edema. The telescope was then slowly withdrawn and the patient tolerated the procedure throughout.    Studies Reviewed: 08/28/23 chest CT 1. Mild interstitial thickening within the lung bases, suggestive of early edema. Small bilateral pleural effusions, right greater than left. Correlate for any clinical signs or symptoms suggestive of congestive heart  failure. 2. Diffuse bronchial wall thickening which may be seen in the setting of chronic obstructive pulmonary disease. 3. New 2 mm and 3 mm nodules in the left upper lobe. If the patient is at low risk, no further follow-up imaging is recommended. In a patient that is at increased risk follow-up imaging at 12 months may be considered. 4. Increased caliber of the main pulmonary artery, suggestive of PA hypertension.  Assessment/Plan: Encounter Diagnoses  Name Primary?   Chronic GERD Yes   Hoarseness    Dysphonia    Age-related vocal fold atrophy    Glottic insufficiency    Post-nasal drip    Environmental and seasonal allergies    Chronic nasal congestion     Assessment and Plan Assessment & Plan Chronic dysphonia Mild vocal fold atrophy and laryngopharyngeal reflux noted on scope exam, but no masses or lesions.  - Referral to speech therapy for voice therapy was offered, but patient would like to hold off for now  Chronic nasal congestion and Postnasal drip Chronic postnasal drip with significant nasal drainage. On Ipratropium nasal spray right now. - Continue ipratropium nasal spray. - Consider adding Flonase  for a few weeks to assess improvement. Rx sent to pharmacy.   Eustachian tube dysfunction Eustachian tube dysfunction likely due to chronic nasal congestion, causing ear fullness and cracking sounds. Normal ear exam. Wears  hearing aids at baseline - Continue nasal sprays to alleviate congestion.  GERD LPR Changes c/w GERD LPR noted on scope exam -  Reflux Gourmet after meals - diet and lifestyle changes to minimize GERD - Refer to [employer] blog for dietary and lifestyle modifications/reflux cook book      Thank you for allowing me to participate in the care of this patient. Please do not hesitate to contact me with any questions or concerns.   Elena Larry, MD Otolaryngology [facility] Phone: [phone] Fax:  [phone]    09/10/2023, 2:16 PM

## 2023-09-10 NOTE — Telephone Encounter (Signed)
 Pharmacist at [facility] is calling into the office to obtain clarification on pts Cardizem  CD dosing.   Delon [facility] received a refill from our office today for the pt to take diltiazem  180 mg daily.  Delon states according to their records, they have been filling for the pt to take Diltiazem  240 mg po daily, as well as PRN Dilt 30 mg.   Delon states the pt was recently discharged from the hospital on 8/16 with pts discharge instruction showing for her to STOP Dilt 240 mg as well as Dilitazem 30 mg.  On pts discharge AVS was checked for her to take Dilitazem 180 mg po daily instead, but Delon is unable to find documentation from the discharge MD as to why the pt was changed to this dose.   Delon is requesting clarification from Dr. Barbaraann as to what dose of diltiazem  they should be refilling for the pt.   Informed Delon [facility] that I also can't find documentation supporting why the pts Diltiazem  was decreased to 180 mg daily during her recent hospitalization.  Informed Delon that it appears the dilt 180 dosing is pulling through as a historical med change from 8/8, but unable to find supporting documentation on this.  Informed Delon that I also see on pts discharge summary from 8/16 where her Dilt 240 mg and 30 mg was stopped and checked for her to take diltiazem  180 mg po daily. Dr. Celinda discharge note from 8/16 states rate controlled continue Cardizem  and Eliquis  no changes made.     Informed Delon that I will route this message to Dr. Barbaraann and his RN for clarification on pts Diltiazem  dosing, and our office will follow-up with them accordingly thereafter.   Delon verbalized understanding and agrees with this plan.

## 2023-09-11 DIAGNOSIS — I50812 Chronic right heart failure: Secondary | ICD-10-CM | POA: Diagnosis not present

## 2023-09-11 DIAGNOSIS — Z23 Encounter for immunization: Secondary | ICD-10-CM | POA: Diagnosis not present

## 2023-09-11 MED ORDER — DILTIAZEM HCL 30 MG PO TABS: 30.0000 mg | ORAL_TABLET | Freq: Every day | ORAL | 3 refills | Status: AC | PRN

## 2023-09-11 NOTE — Telephone Encounter (Signed)
 Returned call back to [employer] and advised:   Let's do 180 mg diltiazem  ER and then 30 mg as needed. Have her schedule a follow-up with APP or me to see if this is still appropriate.    Signed, Darryle DASEN. O'Neal, MD, [facility]  [facility]  [address] [place], [place] [address] [phone]  3:10 PM   Pharmacy is going to call the patient just to confirm she is aware of the med doses, even though she picked up the 180 mg and has the prn dose at home already as well.  She said she is quite aware of her medication regimen but she is going to double check with her.  She is scheduled back with Dr. Barbaraann on 10/13/23.

## 2023-09-12 DIAGNOSIS — I5030 Unspecified diastolic (congestive) heart failure: Secondary | ICD-10-CM | POA: Diagnosis not present

## 2023-09-12 DIAGNOSIS — N184 Chronic kidney disease, stage 4 (severe): Secondary | ICD-10-CM | POA: Diagnosis not present

## 2023-09-12 DIAGNOSIS — I130 Hypertensive heart and chronic kidney disease with heart failure and stage 1 through stage 4 chronic kidney disease, or unspecified chronic kidney disease: Secondary | ICD-10-CM | POA: Diagnosis not present

## 2023-09-18 ENCOUNTER — Ambulatory Visit: Admitting: Pulmonary Disease

## 2023-09-22 DIAGNOSIS — Z01419 Encounter for gynecological examination (general) (routine) without abnormal findings: Secondary | ICD-10-CM | POA: Diagnosis not present

## 2023-09-26 DIAGNOSIS — N184 Chronic kidney disease, stage 4 (severe): Secondary | ICD-10-CM | POA: Diagnosis not present

## 2023-10-02 DIAGNOSIS — M65331 Trigger finger, right middle finger: Secondary | ICD-10-CM | POA: Diagnosis not present

## 2023-10-02 DIAGNOSIS — G5601 Carpal tunnel syndrome, right upper limb: Secondary | ICD-10-CM | POA: Diagnosis not present

## 2023-10-02 DIAGNOSIS — S62321A Displaced fracture of shaft of second metacarpal bone, left hand, initial encounter for closed fracture: Secondary | ICD-10-CM | POA: Diagnosis not present

## 2023-10-03 DIAGNOSIS — H43813 Vitreous degeneration, bilateral: Secondary | ICD-10-CM | POA: Diagnosis not present

## 2023-10-03 DIAGNOSIS — H353112 Nonexudative age-related macular degeneration, right eye, intermediate dry stage: Secondary | ICD-10-CM | POA: Diagnosis not present

## 2023-10-03 DIAGNOSIS — H353221 Exudative age-related macular degeneration, left eye, with active choroidal neovascularization: Secondary | ICD-10-CM | POA: Diagnosis not present

## 2023-10-10 DIAGNOSIS — M216X1 Other acquired deformities of right foot: Secondary | ICD-10-CM | POA: Diagnosis not present

## 2023-10-10 DIAGNOSIS — M25871 Other specified joint disorders, right ankle and foot: Secondary | ICD-10-CM | POA: Diagnosis not present

## 2023-10-12 NOTE — Progress Notes (Unsigned)
 Cardiology Office Note:  .   Date:  10/13/2023  ID:  Kathleen Santana, DOB 14-Sep-1926, MRN [id number] PCP: Clarice Nottingham, MD  [facility] Providers Cardiologist:  Darryle ONEIDA Decent, MD Electrophysiologist:  Eulas FORBES Furbish, MD { History of Present Illness: .    Chief Complaint  Patient presents with   Follow-up         Kathleen Santana is a 89 y.o. female with history of persistent Afib who presents for follow-up.    History of Present Illness   Kathleen Santana is a 89 year old female with persistent atrial fibrillation who presents for follow-up.  She has a history of persistent atrial fibrillation, previously managed with amiodarone , which successfully converted her rhythm. Currently, she is on a rate control strategy with diltiazem  180 mg daily and Eliquis  2.5 mg twice daily, which has stabilized her heart rate.  She has experienced multiple admissions for hypoxic respiratory failure, suspected to be due to aspiration/CHF. She continues to experience intermittent shortness of breath. She is working with a pulmonary specialist to address these issues.  She has stage four chronic kidney disease and is under the care of a nephrologist. She is on Lasix  20 mg three times a week (Monday, Wednesday, Friday) to manage fluid retention, and she reports significant improvement in swelling with the use of compression stockings. She is currently taking Tylenol  for pain management.  Socially, she remains active, living independently in her home, driving, and participating in volunteer work three days a week. She engages in physical activities such as walking, aerobics, and yoga, although she is currently walking about a mile and a half, down from her usual three miles. No falls reported and she continues to manage her daily activities independently.          Problem List 1. Persistent Afib  -CHADSVASC=4 (age, sex, HTN) -DCCV 04/08/2023 & 05/19/2023 -failed amio, opted for  rate control  2. Atrial tachycardia/PACs 3. HTN 4. HLD 5. Hypothyroidism  6. CKD IV    ROS: All other ROS reviewed and negative. Pertinent positives noted in the HPI.     Studies Reviewed: SABRA       TTE 04/29/2023  1. Left ventricular ejection fraction, by estimation, is 50 to 55%. The  left ventricle has low normal function. The left ventricle demonstrates  global hypokinesis. There is mild concentric left ventricular hypertrophy.  Left ventricular diastolic  function could not be evaluated.   2. Right ventricular systolic function is normal. The right ventricular  size is normal. There is normal pulmonary artery systolic pressure. The  estimated right ventricular systolic pressure is 24.3 mmHg.   3. Left atrial size was mildly dilated.   4. Right atrial size was severely dilated.   5. The mitral valve is normal in structure. Moderate mitral valve  regurgitation. No evidence of mitral stenosis.   6. Tricuspid valve regurgitation is moderate to severe.   7. The aortic valve is tricuspid. Aortic valve regurgitation is not  visualized. Aortic valve sclerosis/calcification is present, without any  evidence of aortic stenosis.   8. The inferior vena cava is normal in size with greater than 50%  respiratory variability, suggesting right atrial pressure of 3 mmHg.  Physical Exam:   VS:  BP 120/76   Pulse 91   Ht 5' 2 (1.575 m)   SpO2 99%   BMI 20.63 kg/m    Wt Readings from Last 3 Encounters:  09/01/23 112 lb 12.8 oz (51.2 kg)  08/30/23 113 lb 8 oz (51.5 kg)  08/07/23 113 lb 8 oz (51.5 kg)    GEN: Well nourished, well developed in no acute distress NECK: No JVD; No carotid bruits CARDIAC: irregular rhythm, no murmurs, rubs, gallops RESPIRATORY:  Clear to auscultation without rales, wheezing or rhonchi  ABDOMEN: Soft, non-tender, non-distended EXTREMITIES:  No edema; No deformity  ASSESSMENT AND PLAN: .   Assessment and Plan    Persistent atrial fibrillation Persistent  atrial fibrillation managed with rate control. Stable on diltiazem  and Eliquis . - Continue diltiazem  180 mg daily. - Continue Eliquis  2.5 mg BID. - Follow up in 6 months.  Diastolic heart failure with lower extremity edema Diastolic heart failure with improved edema on Lasix  and compression stockings. - Continue Lasix  20 mg three times a week. - Continue use of compression stockings.  Hypertension -At goal today  Chronic kidney disease stage 4 Chronic kidney disease stage 4 managed by nephrology. Lasix  dosing adjusted for fluid management and kidney function. - Continue to follow with nephrology. - Avoid NSAIDs, including ibuprofen. - Use Tylenol  for pain management.              Follow-up: Return in about 6 months (around 04/11/2024).   Signed, Darryle DASEN. Barbaraann, MD, [facility]  [facility]  [address] [place], [place] [address] [phone]  1:21 PM

## 2023-10-13 ENCOUNTER — Encounter: Payer: Self-pay | Admitting: Cardiovascular Disease

## 2023-10-13 ENCOUNTER — Ambulatory Visit: Attending: Cardiovascular Disease | Admitting: Cardiovascular Disease

## 2023-10-13 VITALS — BP 120/76 | HR 91 | Ht 62.0 in

## 2023-10-13 DIAGNOSIS — I4719 Other supraventricular tachycardia: Secondary | ICD-10-CM

## 2023-10-13 DIAGNOSIS — I4819 Other persistent atrial fibrillation: Secondary | ICD-10-CM | POA: Diagnosis not present

## 2023-10-13 DIAGNOSIS — I10 Essential (primary) hypertension: Secondary | ICD-10-CM | POA: Diagnosis not present

## 2023-10-13 DIAGNOSIS — N184 Chronic kidney disease, stage 4 (severe): Secondary | ICD-10-CM | POA: Diagnosis not present

## 2023-10-13 NOTE — Patient Instructions (Signed)
 Medication Instructions:  Continue same medications *If you need a refill on your cardiac medications before your next appointment, please call your pharmacy*  Lab Work: None ordered  Testing/Procedures: None ordered  Follow-Up: At [facility], you and your health needs are our priority.  As part of our continuing mission to provide you with exceptional heart care, our providers are all part of one team.  This team includes your primary Cardiologist (physician) and Advanced Practice Providers or APPs (Physician Assistants and Nurse Practitioners) who all work together to provide you with the care you need, when you need it.  Your next appointment:  6 months   Call in Nov to schedule March appointment     Provider:  Dr.O'Neal   We recommend signing up for the patient portal called MyChart.  Sign up information is provided on this After Visit Summary.  MyChart is used to connect with patients for Virtual Visits (Telemedicine).  Patients are able to view lab/test results, encounter notes, upcoming appointments, etc.  Non-urgent messages can be sent to your provider as well.   To learn more about what you can do with MyChart, go to [email or web address].

## 2023-11-06 DIAGNOSIS — M810 Age-related osteoporosis without current pathological fracture: Secondary | ICD-10-CM | POA: Diagnosis not present

## 2023-11-27 DIAGNOSIS — N184 Chronic kidney disease, stage 4 (severe): Secondary | ICD-10-CM | POA: Diagnosis not present

## 2023-12-02 DIAGNOSIS — N1832 Chronic kidney disease, stage 3b: Secondary | ICD-10-CM | POA: Diagnosis not present

## 2023-12-02 DIAGNOSIS — E875 Hyperkalemia: Secondary | ICD-10-CM | POA: Diagnosis not present

## 2023-12-02 DIAGNOSIS — I5030 Unspecified diastolic (congestive) heart failure: Secondary | ICD-10-CM | POA: Diagnosis not present

## 2023-12-02 DIAGNOSIS — I130 Hypertensive heart and chronic kidney disease with heart failure and stage 1 through stage 4 chronic kidney disease, or unspecified chronic kidney disease: Secondary | ICD-10-CM | POA: Diagnosis not present

## 2023-12-16 ENCOUNTER — Other Ambulatory Visit (HOSPITAL_COMMUNITY): Payer: Self-pay

## 2023-12-16 ENCOUNTER — Other Ambulatory Visit: Payer: Self-pay

## 2023-12-16 ENCOUNTER — Encounter (HOSPITAL_COMMUNITY): Payer: Self-pay

## 2023-12-16 DIAGNOSIS — M810 Age-related osteoporosis without current pathological fracture: Secondary | ICD-10-CM | POA: Diagnosis not present

## 2023-12-16 MED ORDER — PROLIA 60 MG/ML ~~LOC~~ SOSY
PREFILLED_SYRINGE | SUBCUTANEOUS | 1 refills | Status: AC
  Filled 2023-12-16: qty 1, 180d supply, fill #0

## 2023-12-16 NOTE — Progress Notes (Signed)
 Specialty Pharmacy Initial Fill Coordination Note  Kathleen Santana is a 89 y.o. female contacted today regarding initial fill of specialty medication(s) Denosumab (Prolia)   Patient requested Delivery   Delivery date: 12/18/23   Verified address: [facility] [address]   Medication will be filled on: 12/17/23   Patient is aware of $0 copayment.

## 2023-12-16 NOTE — Progress Notes (Signed)
 Patient to be enrolled with [facility]. Routed to Tiffany.

## 2023-12-16 NOTE — Progress Notes (Signed)
 Pharmacy Patient Advocate Encounter  Insurance verification completed.   The patient is insured through [employer] claim for Prolia. Co-pay is $0.  This test claim was processed through [facility]- copay amounts may vary at other pharmacies due to pharmacy/plan contracts, or as the patient moves through the different stages of their insurance plan.

## 2023-12-17 ENCOUNTER — Other Ambulatory Visit: Payer: Self-pay

## 2023-12-29 DIAGNOSIS — G5711 Meralgia paresthetica, right lower limb: Secondary | ICD-10-CM | POA: Diagnosis not present

## 2023-12-29 DIAGNOSIS — G629 Polyneuropathy, unspecified: Secondary | ICD-10-CM | POA: Diagnosis not present

## 2023-12-29 DIAGNOSIS — R202 Paresthesia of skin: Secondary | ICD-10-CM | POA: Diagnosis not present

## 2023-12-31 DIAGNOSIS — H353112 Nonexudative age-related macular degeneration, right eye, intermediate dry stage: Secondary | ICD-10-CM | POA: Diagnosis not present

## 2023-12-31 DIAGNOSIS — H43813 Vitreous degeneration, bilateral: Secondary | ICD-10-CM | POA: Diagnosis not present

## 2023-12-31 DIAGNOSIS — H353221 Exudative age-related macular degeneration, left eye, with active choroidal neovascularization: Secondary | ICD-10-CM | POA: Diagnosis not present

## 2024-01-06 DIAGNOSIS — H43813 Vitreous degeneration, bilateral: Secondary | ICD-10-CM | POA: Diagnosis not present

## 2024-01-06 DIAGNOSIS — H04123 Dry eye syndrome of bilateral lacrimal glands: Secondary | ICD-10-CM | POA: Diagnosis not present

## 2024-01-06 DIAGNOSIS — H02834 Dermatochalasis of left upper eyelid: Secondary | ICD-10-CM | POA: Diagnosis not present

## 2024-01-06 DIAGNOSIS — H353221 Exudative age-related macular degeneration, left eye, with active choroidal neovascularization: Secondary | ICD-10-CM | POA: Diagnosis not present

## 2024-01-06 DIAGNOSIS — H02831 Dermatochalasis of right upper eyelid: Secondary | ICD-10-CM | POA: Diagnosis not present

## 2024-01-06 DIAGNOSIS — H353111 Nonexudative age-related macular degeneration, right eye, early dry stage: Secondary | ICD-10-CM | POA: Diagnosis not present

## 2024-01-06 DIAGNOSIS — H527 Unspecified disorder of refraction: Secondary | ICD-10-CM | POA: Diagnosis not present

## 2024-01-06 DIAGNOSIS — H26492 Other secondary cataract, left eye: Secondary | ICD-10-CM | POA: Diagnosis not present

## 2024-01-06 DIAGNOSIS — Z961 Presence of intraocular lens: Secondary | ICD-10-CM | POA: Diagnosis not present

## 2024-04-08 ENCOUNTER — Ambulatory Visit: Admitting: Cardiovascular Disease
